# Patient Record
Sex: Male | Born: 1965 | Race: Black or African American | Hispanic: No | Smoking: Former smoker
Health system: Southern US, Community
[De-identification: ages and names within clinical notes are randomized; demographics above are authoritative.]

## PROBLEM LIST (undated history)

## (undated) DIAGNOSIS — G4733 Obstructive sleep apnea (adult) (pediatric): Secondary | ICD-10-CM

## (undated) DIAGNOSIS — G4730 Sleep apnea, unspecified: Secondary | ICD-10-CM

## (undated) DIAGNOSIS — E785 Hyperlipidemia, unspecified: Secondary | ICD-10-CM

## (undated) DIAGNOSIS — I10 Essential (primary) hypertension: Secondary | ICD-10-CM

## (undated) DIAGNOSIS — Z9989 Dependence on other enabling machines and devices: Secondary | ICD-10-CM

## (undated) DIAGNOSIS — I2510 Atherosclerotic heart disease of native coronary artery without angina pectoris: Secondary | ICD-10-CM

## (undated) DIAGNOSIS — R202 Paresthesia of skin: Secondary | ICD-10-CM

## (undated) DIAGNOSIS — I219 Acute myocardial infarction, unspecified: Secondary | ICD-10-CM

## (undated) DIAGNOSIS — E119 Type 2 diabetes mellitus without complications: Secondary | ICD-10-CM

## (undated) HISTORY — DX: Hyperlipidemia, unspecified: E78.5

## (undated) HISTORY — DX: Sleep apnea, unspecified: G47.30

## (undated) HISTORY — DX: Paresthesia of skin: R20.2

## (undated) HISTORY — DX: Atherosclerotic heart disease of native coronary artery without angina pectoris: I25.10

---

## 1989-05-11 HISTORY — PX: PATELLAR TENDON REPAIR: SHX737

## 2010-02-05 ENCOUNTER — Inpatient Hospital Stay (HOSPITAL_COMMUNITY): Admission: EM | Admit: 2010-02-05 | Discharge: 2010-02-08 | Payer: Self-pay | Admitting: Emergency Medicine

## 2010-02-07 ENCOUNTER — Encounter (INDEPENDENT_AMBULATORY_CARE_PROVIDER_SITE_OTHER): Payer: Self-pay | Admitting: Cardiovascular Disease

## 2010-11-27 LAB — CBC
HCT: 37.6 % — ABNORMAL LOW (ref 39.0–52.0)
HCT: 32.8 % — ABNORMAL LOW (ref 39.0–52.0)
HCT: 35.2 % — ABNORMAL LOW (ref 39.0–52.0)
HCT: 40.4 % (ref 39.0–52.0)
Hemoglobin: 11.3 g/dL — ABNORMAL LOW (ref 13.0–17.0)
MCHC: 33.8 g/dL (ref 30.0–36.0)
MCHC: 34.4 g/dL (ref 30.0–36.0)
MCV: 83.3 fL (ref 78.0–100.0)
Platelets: 222 10*3/uL (ref 150–400)
Platelets: 208 10*3/uL (ref 150–400)
Platelets: 211 10*3/uL (ref 150–400)
Platelets: 248 10*3/uL (ref 150–400)
RBC: 4.56 MIL/uL (ref 4.22–5.81)
RDW: 13.1 % (ref 11.5–15.5)
RDW: 13 % (ref 11.5–15.5)
RDW: 13.4 % (ref 11.5–15.5)
WBC: 8.6 10*3/uL (ref 4.0–10.5)
WBC: 9.1 10*3/uL (ref 4.0–10.5)

## 2010-11-27 LAB — BASIC METABOLIC PANEL
BUN: 9 mg/dL (ref 6–23)
BUN: 8 mg/dL (ref 6–23)
CO2: 28 mEq/L (ref 19–32)
CO2: 27 mEq/L (ref 19–32)
Calcium: 8.5 mg/dL (ref 8.4–10.5)
Chloride: 106 mEq/L (ref 96–112)
Creatinine, Ser: 1.17 mg/dL (ref 0.4–1.5)
Glucose, Bld: 133 mg/dL — ABNORMAL HIGH (ref 70–99)
Glucose, Bld: 139 mg/dL — ABNORMAL HIGH (ref 70–99)
Potassium: 3.3 mEq/L — ABNORMAL LOW (ref 3.5–5.1)

## 2010-11-27 LAB — HEMOGLOBIN A1C: Mean Plasma Glucose: 189 mg/dL — ABNORMAL HIGH (ref <117)

## 2010-11-27 LAB — CARDIAC PANEL(CRET KIN+CKTOT+MB+TROPI)
CK, MB: 120.5 ng/mL (ref 0.3–4.0)
CK, MB: 143.9 ng/mL (ref 0.3–4.0)
Relative Index: 12.1 — ABNORMAL HIGH (ref 0.0–2.5)
Relative Index: 13.8 — ABNORMAL HIGH (ref 0.0–2.5)
Total CK: 1188 U/L — ABNORMAL HIGH (ref 7–232)
Total CK: 872 U/L — ABNORMAL HIGH (ref 7–232)
Total CK: 931 U/L — ABNORMAL HIGH (ref 7–232)
Troponin I: 13.27 ng/mL (ref 0.00–0.06)

## 2010-11-27 LAB — DIFFERENTIAL
Basophils Absolute: 0 10*3/uL (ref 0.0–0.1)
Basophils Relative: 0 % (ref 0–1)
Eosinophils Absolute: 0 10*3/uL (ref 0.0–0.7)
Lymphs Abs: 1.1 10*3/uL (ref 0.7–4.0)
Monocytes Relative: 3 % (ref 3–12)
Neutro Abs: 7.5 10*3/uL (ref 1.7–7.7)

## 2010-11-27 LAB — GLUCOSE, CAPILLARY
Glucose-Capillary: 122 mg/dL — ABNORMAL HIGH (ref 70–99)
Glucose-Capillary: 111 mg/dL — ABNORMAL HIGH (ref 70–99)
Glucose-Capillary: 123 mg/dL — ABNORMAL HIGH (ref 70–99)
Glucose-Capillary: 147 mg/dL — ABNORMAL HIGH (ref 70–99)
Glucose-Capillary: 154 mg/dL — ABNORMAL HIGH (ref 70–99)
Glucose-Capillary: 155 mg/dL — ABNORMAL HIGH (ref 70–99)

## 2010-11-27 LAB — COMPREHENSIVE METABOLIC PANEL
ALT: 31 U/L (ref 0–53)
AST: 90 U/L — ABNORMAL HIGH (ref 0–37)
Alkaline Phosphatase: 75 U/L (ref 39–117)
BUN: 8 mg/dL (ref 6–23)
GFR calc non Af Amer: >60 mL/min (ref >60)
Sodium: 142 mEq/L (ref 135–145)
Total Bilirubin: 0.5 mg/dL (ref 0.3–1.2)
Total Protein: 6.6 g/dL (ref 6.0–8.3)

## 2010-11-27 LAB — POCT I-STAT, CHEM 8
BUN: 11 mg/dL (ref 6–23)
Calcium, Ion: 1.15 mmol/L (ref 1.12–1.32)
Creatinine, Ser: 0.8 mg/dL (ref 0.4–1.5)
HCT: 44 % (ref 39.0–52.0)
Sodium: 138 mEq/L (ref 135–145)

## 2010-11-27 LAB — POCT CARDIAC MARKERS: Myoglobin, poc: 210 ng/mL (ref 12–200)

## 2010-11-27 LAB — LIPID PANEL
HDL: 27 mg/dL — ABNORMAL LOW (ref >39)
LDL Cholesterol: 96 mg/dL (ref 0–99)
VLDL: 25 mg/dL (ref 0–40)

## 2010-11-27 LAB — PROTIME-INR
INR: 1.09 (ref 0.00–1.49)
Prothrombin Time: 14 seconds (ref 11.6–15.2)

## 2010-11-27 LAB — MAGNESIUM: Magnesium: 2.3 mg/dL (ref 1.5–2.5)

## 2011-11-20 ENCOUNTER — Emergency Department (HOSPITAL_COMMUNITY): Payer: Worker's Compensation

## 2011-11-20 ENCOUNTER — Other Ambulatory Visit: Payer: Self-pay

## 2011-11-20 ENCOUNTER — Encounter (HOSPITAL_COMMUNITY): Payer: Self-pay

## 2011-11-20 ENCOUNTER — Emergency Department (HOSPITAL_COMMUNITY)
Admission: EM | Admit: 2011-11-20 | Discharge: 2011-11-20 | Disposition: A | Discharge status: Home / Self Care | Payer: Worker's Compensation | Attending: Emergency Medicine | Admitting: Emergency Medicine

## 2011-11-20 DIAGNOSIS — R079 Chest pain, unspecified: Secondary | ICD-10-CM | POA: Insufficient documentation

## 2011-11-20 DIAGNOSIS — R0602 Shortness of breath: Secondary | ICD-10-CM | POA: Insufficient documentation

## 2011-11-20 DIAGNOSIS — Z7729 Contact with and (suspected ) exposure to other hazardous substances: Secondary | ICD-10-CM | POA: Insufficient documentation

## 2011-11-20 HISTORY — DX: Essential (primary) hypertension: I10

## 2011-11-20 LAB — DIFFERENTIAL
Basophils Absolute: 0 10*3/uL (ref 0.0–0.1)
Basophils Relative: 0 % (ref 0–1)
Eosinophils Relative: 3 % (ref 0–5)
Monocytes Absolute: 0.5 10*3/uL (ref 0.1–1.0)
Neutro Abs: 1.7 10*3/uL (ref 1.7–7.7)

## 2011-11-20 LAB — TROPONIN I: Troponin I: <0.3 ng/mL (ref <0.30)

## 2011-11-20 LAB — BASIC METABOLIC PANEL
Calcium: 9.3 mg/dL (ref 8.4–10.5)
Chloride: 102 mEq/L (ref 96–112)
Creatinine, Ser: 1.2 mg/dL (ref 0.50–1.35)
GFR calc Af Amer: 84 mL/min — ABNORMAL LOW (ref >90)

## 2011-11-20 LAB — CBC
HCT: 37.8 % — ABNORMAL LOW (ref 39.0–52.0)
MCHC: 34.9 g/dL (ref 30.0–36.0)
RDW: 13.1 % (ref 11.5–15.5)

## 2011-11-20 MED ORDER — ASPIRIN 81 MG PO CHEW
324.0000 mg | CHEWABLE_TABLET | Freq: Once | ORAL | Status: AC
  Administered 2011-11-20: 324 mg via ORAL
  Filled 2011-11-20: qty 4

## 2011-11-20 NOTE — ED Notes (Signed)
KGM:[id number]<VO> Expected date:11/20/11<BR> Expected time:10:21 AM<BR> Means of arrival:Ambulance<BR> Comments:<BR> Aerosal Can exposure

## 2011-11-20 NOTE — Discharge Instructions (Signed)
Chest Pain (Nonspecific) It is often hard to give a specific diagnosis for the cause of chest pain. There is always a chance that your pain could be related to something serious, such as a heart attack or a blood clot in the lungs. You need to follow up with your caregiver for further evaluation. CAUSES   Heartburn.   Pneumonia or bronchitis.   Anxiety or stress.   Inflammation around your heart (pericarditis) or lung (pleuritis or pleurisy).   A blood clot in the lung.   A collapsed lung (pneumothorax). It can develop suddenly on its own (spontaneous pneumothorax) or from injury (trauma) to the chest.   Shingles infection (herpes zoster virus).  The chest wall is composed of bones, muscles, and cartilage. Any of these can be the source of the pain.  The bones can be bruised by injury.   The muscles or cartilage can be strained by coughing or overwork.   The cartilage can be affected by inflammation and become sore (costochondritis).  DIAGNOSIS  Lab tests or other studies, such as X-rays, electrocardiography, stress testing, or cardiac imaging, may be needed to find the cause of your pain.  TREATMENT   Treatment depends on what may be causing your chest pain. Treatment may include:   Acid blockers for heartburn.   Anti-inflammatory medicine.   Pain medicine for inflammatory conditions.   Antibiotics if an infection is present.   You may be advised to change lifestyle habits. This includes stopping smoking and avoiding alcohol, caffeine, and chocolate.   You may be advised to keep your head raised (elevated) when sleeping. This reduces the chance of acid going backward from your stomach into your esophagus.   Most of the time, nonspecific chest pain will improve within 2 to 3 days with rest and mild pain medicine.  HOME CARE INSTRUCTIONS   If antibiotics were prescribed, take your antibiotics as directed. Finish them even if you start to feel better.   For the next few  days, avoid physical activities that bring on chest pain. Continue physical activities as directed.   Do not smoke.   Avoid drinking alcohol.   Only take over-the-counter or prescription medicine for pain, discomfort, or fever as directed by your caregiver.   Follow your caregiver's suggestions for further testing if your chest pain does not go away.   Keep any follow-up appointments you made. If you do not go to an appointment, you could develop lasting (chronic) problems with pain. If there is any problem keeping an appointment, you must call to reschedule.  SEEK MEDICAL CARE IF:   You think you are having problems from the medicine you are taking. Read your medicine instructions carefully.   Your chest pain does not go away, even after treatment.   You develop a rash with blisters on your chest.  SEEK IMMEDIATE MEDICAL CARE IF:   You have increased chest pain or pain that spreads to your arm, neck, jaw, back, or abdomen.   You develop shortness of breath, an increasing cough, or you are coughing up blood.   You have severe back or abdominal pain, feel nauseous, or vomit.   You develop severe weakness, fainting, or chills.   You have a fever.  THIS IS AN EMERGENCY. Do not wait to see if the pain will go away. Get medical help at once. Call your local emergency services (911 in [place] Do not drive yourself to the hospital. MAKE SURE YOU:   Understand these instructions.     Will watch your condition.   Will get help right away if you are not doing well or get worse.  Document Released: 06/06/2005 Document Revised: 08/16/2011 Document Reviewed: 04/01/2008 Exit[employer] Patient Information 2012 [employer]. 

## 2011-11-20 NOTE — ED Notes (Signed)
Continues to deny chest pain or SOB.

## 2011-11-20 NOTE — ED Provider Notes (Signed)
History     CSN: [id number]  Arrival date & time 11/20/11  1046   First MD Initiated Contact with Patient 11/20/11 1104      Chief Complaint  Patient presents with  . Shortness of Breath  . Chest Pain    (Consider location/radiation/quality/duration/timing/severity/associated sxs/prior treatment) HPI Comments: Patient presents after exposure to an unknown substance. He works as a Immunologist. While one of the trash bags was being compressed he inhaled a white substance which now the sulfa. He had associated chest tightness and shortness of breath which resolved upon breathing cleaner. At this time he is back to baseline. The HazMat team did not identify the substance. He has been decontaminated twice. At this time is at his baseline  The history is provided by the patient. No language interpreter was used.    Past Medical History  Diagnosis Date  . Hypertension   . MI (myocardial infarction)     Past Surgical History  Procedure Date  . Knee surgery     Family History  Problem Relation Age of Onset  . Hypertension Mother   . Cancer Mother   . Diabetes Mother   . Cancer Father     History  Substance Use Topics  . Smoking status: Former Games developer  . Smokeless tobacco: Never Used  . Alcohol Use: No      Review of Systems  Constitutional: Negative for fever, chills, activity change, appetite change and fatigue.  HENT: Negative for congestion, sore throat, rhinorrhea, neck pain and neck stiffness.   Respiratory: Positive for shortness of breath (only during exposure). Negative for cough.   Cardiovascular: Positive for chest pain (only during exposure). Negative for palpitations.  Gastrointestinal: Negative for nausea, vomiting and abdominal pain.  Genitourinary: Negative for dysuria, urgency, frequency and flank pain.  Musculoskeletal: Negative for myalgias, back pain and arthralgias.  Neurological: Negative for dizziness, weakness, light-headedness, numbness and  headaches.  All other systems reviewed and are negative.    Allergies  Bee and Food allergy formula  Home Medications   Current Outpatient Rx  Name Route Sig Dispense Refill  . AMLODIPINE BESYLATE 10 MG PO TABS Oral Take 10 mg by mouth daily.    . ASPIRIN EC 81 MG PO TBEC Oral Take 81 mg by mouth daily.    Marland Kitchen CLOPIDOGREL BISULFATE 75 MG PO TABS Oral Take 75 mg by mouth daily.    . ISOSORBIDE MONONITRATE ER 30 MG PO TB24 Oral Take 30 mg by mouth daily.    Marland Kitchen LISINOPRIL 40 MG PO TABS Oral Take 40 mg by mouth daily.    . NEBIVOLOL HCL 20 MG PO TABS Oral Take 20 mg by mouth daily.    Marland Kitchen NIACIN-SIMVASTATIN ER 500-20 MG PO TB24 Oral Take 1 tablet by mouth at bedtime.    Marland Kitchen NITROGLYCERIN 0.4 MG SL SUBL Sublingual Place 0.4 mg under the tongue every 5 (five) minutes as needed.    Marland Kitchen SPIRONOLACTONE 25 MG PO TABS Oral Take 25 mg by mouth 2 (two) times daily.      BP 136/79  Pulse 65  Temp(Src) 97.9 F (36.6 C) (Oral)  Resp 16  SpO2 98%  Physical Exam  Nursing note and vitals reviewed. Constitutional: He is oriented to person, place, and time. He appears well-developed and well-nourished. No distress.  HENT:  Head: Normocephalic and atraumatic.  Mouth/Throat: Oropharynx is clear and moist.  Eyes: Conjunctivae and EOM are normal. Pupils are equal, round, and reactive to light.  Neck: Normal  range of motion. Neck supple.  Cardiovascular: Normal rate, regular rhythm, normal heart sounds and intact distal pulses.  Exam reveals no gallop and no friction rub.   No murmur heard. Pulmonary/Chest: Effort normal and breath sounds normal. No respiratory distress. He exhibits no tenderness.  Abdominal: Soft. Bowel sounds are normal. There is no tenderness.  Musculoskeletal: Normal range of motion. He exhibits no tenderness.  Neurological: He is alert and oriented to person, place, and time. No cranial nerve deficit.  Skin: Skin is warm and dry. No rash noted.    ED Course  Procedures (including  critical care time)   Date: 11/20/2011  Rate: 59  Rhythm: sinus bradycardia  QRS Axis: normal  Intervals: normal  ST/T Wave abnormalities: deepening twave inversions   Conduction Disutrbances:none  Narrative Interpretation:   Old EKG Reviewed: changes noted  Labs Reviewed  CBC - Abnormal; Notable for the following:    HCT 37.8 (*)    All other components within normal limits  DIFFERENTIAL - Abnormal; Notable for the following:    Neutrophils Relative 36 (*)    Lymphocytes Relative 50 (*)    All other components within normal limits  BASIC METABOLIC PANEL - Abnormal; Notable for the following:    Glucose, Bld 129 (*)    GFR calc non Af Amer 73 (*)    GFR calc Af Amer 84 (*)    All other components within normal limits  TROPONIN I  TROPONIN I   Dg Chest 2 View  11/20/2011  *RADIOLOGY REPORT*  Clinical Data: Chest pain.  CHEST - 2 VIEW  Comparison: 02/05/2010  Findings: The heart, mediastinal, and hilar contours are normal. The lungs are well-expanded and clear.  Negative for pleural effusion. The bony thorax is unremarkable.  IMPRESSION: No acute cardiopulmonary disease.  Original Report Authenticated By: Britta Mccreedy, M.D.     1. Exposure to potentially hazardous substances   2. Chest pain       MDM  Department for approximately 4 hours after exposure to an unknown agent. Was asymptomatic on arrival to emergency department after presenting cleaner. Was deconed twice prior to arrival. Delta troponin was performed given the patient's history and the presence of chest pain with breathing substance. This was negative. Thus i feel this adequately rules out acute coronary syndrome he'll be discharged home with instructions to followup with primary care physician as needed. Return precautions were provided.        Dayton Bailiff, MD 11/20/11 [phone]
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
## 2011-11-26 ENCOUNTER — Ambulatory Visit (INDEPENDENT_AMBULATORY_CARE_PROVIDER_SITE_OTHER): Payer: 59 | Admitting: Physician Assistant

## 2011-11-26 VITALS — BP 115/73 | HR 61 | Temp 97.3°F | Resp 18 | Ht 66.0 in | Wt 205.0 lb

## 2011-11-26 DIAGNOSIS — R059 Cough, unspecified: Secondary | ICD-10-CM

## 2011-11-26 DIAGNOSIS — R197 Diarrhea, unspecified: Secondary | ICD-10-CM

## 2011-11-26 DIAGNOSIS — R110 Nausea: Secondary | ICD-10-CM

## 2011-11-26 DIAGNOSIS — R05 Cough: Secondary | ICD-10-CM

## 2011-11-26 DIAGNOSIS — I2510 Atherosclerotic heart disease of native coronary artery without angina pectoris: Secondary | ICD-10-CM | POA: Insufficient documentation

## 2011-11-26 DIAGNOSIS — J411 Mucopurulent chronic bronchitis: Secondary | ICD-10-CM

## 2011-11-26 MED ORDER — DICYCLOMINE HCL 10 MG PO CAPS: 10.0000 mg | ORAL_CAPSULE | Freq: Three times a day (TID) | ORAL | Status: DC

## 2011-11-26 MED ORDER — ONDANSETRON 8 MG PO TBDP: 8.0000 mg | ORAL_TABLET | Freq: Three times a day (TID) | ORAL | Status: AC | PRN

## 2011-11-26 NOTE — Progress Notes (Signed)
  Subjective:    Patient ID: John May, male    DOB: Jun 16, 1966, 46 y.o.   MRN: [id number]  HPI This patient presents with nausea, abdominal cramping and diarrhea x 3 days.  Nausea has improved a lot, but not resolved.  No eating since 6 pm 11/24/11.  Able to drink, "a little."  No dizziness.  No HA.  No blood or mucous in stool.  No recent ravel, new foods, new meds, recent antibiotics. One possible sick contact at work.  Review of Systems As above.    Objective:   Physical Exam Vital signs noted. Well-developed, well nourished BM who is awake, alert and oriented, in NAD. HEENT: [place]/AT, PERRL, EOMI.  Sclera and conjunctiva are clear.  EAC are patent, TMs are normal in appearance. Nasal mucosa is pink and moist. OP is clear. Neck: supple, non-tender, no lymphadenopathey, thyromegaly. Heart: RRR, no murmur Lungs: CTA Abdomen: normo-active bowel sounds, supple, no mass or organomegaly. Mild generalized tenderness. Extremities: no cyanosis, clubbing or edema. Skin: warm and dry without rash.     Assessment & Plan:  Viral gastroenteritis.  Supportive care.  Bentyl and Zofran for comfort.  Anticipatory guidance.

## 2011-11-26 NOTE — Patient Instructions (Signed)
Get lots of rest and drink extra fluids-at least 64 ounces of water daily.  As you are able to resume your regular diet, your stools will normalize.

## 2012-04-17 ENCOUNTER — Encounter: Payer: Self-pay | Admitting: *Deleted

## 2012-04-17 ENCOUNTER — Encounter: Payer: Self-pay | Admitting: Cardiovascular Disease

## 2012-04-17 DIAGNOSIS — I10 Essential (primary) hypertension: Secondary | ICD-10-CM | POA: Insufficient documentation

## 2012-04-17 DIAGNOSIS — I219 Acute myocardial infarction, unspecified: Secondary | ICD-10-CM | POA: Insufficient documentation

## 2012-04-18 ENCOUNTER — Ambulatory Visit (INDEPENDENT_AMBULATORY_CARE_PROVIDER_SITE_OTHER): Payer: 59 | Admitting: Cardiology

## 2012-04-18 ENCOUNTER — Encounter: Payer: Self-pay | Admitting: Cardiology

## 2012-04-18 VITALS — BP 151/81 | HR 60 | Ht 67.0 in | Wt 206.0 lb

## 2012-04-18 DIAGNOSIS — E785 Hyperlipidemia, unspecified: Secondary | ICD-10-CM

## 2012-04-18 DIAGNOSIS — I2510 Atherosclerotic heart disease of native coronary artery without angina pectoris: Secondary | ICD-10-CM

## 2012-04-18 DIAGNOSIS — I10 Essential (primary) hypertension: Secondary | ICD-10-CM

## 2012-04-18 DIAGNOSIS — I7090 Unspecified atherosclerosis: Secondary | ICD-10-CM

## 2012-04-18 DIAGNOSIS — R079 Chest pain, unspecified: Secondary | ICD-10-CM

## 2012-04-18 DIAGNOSIS — E782 Mixed hyperlipidemia: Secondary | ICD-10-CM | POA: Insufficient documentation

## 2012-04-18 DIAGNOSIS — R0600 Dyspnea, unspecified: Secondary | ICD-10-CM

## 2012-04-18 NOTE — Assessment & Plan Note (Signed)
Continue statin. 

## 2012-04-18 NOTE — Patient Instructions (Addendum)
Your physician recommends that you schedule a follow-up appointment in: 4 WEEKS WITH DR CRENSHAW  

## 2012-04-18 NOTE — Progress Notes (Signed)
HPI: 46 year old male with past medical history of coronary artery disease for evaluation of chest pain. Patient suffered a myocardial infarction in may of 2011. Cardiac catheterization in May of 2011 revealed A. Normal left main. There was a 50% proximal LAD and a 30% mid. There was a 40% ramus intermedius and a 40% first obtuse marginal. The right coronary artery had a 70% proximal lesion and then was totally occluded. Attempt at PCI was not successful and the patient was treated medically.ejection fraction was 45-50%. Patient had an echocardiogram in may of 2011 that showed an ejection fraction of 55-60%. There was mild hypokinesis of the basal and mid inferior wall. The left atrium was mildly dilated. The right ventricle was mildly dilated. Patient has been followed by Dr. Tresa Endo at [facility] heart and vascular since that time. Those records are not available. He has had stress tests by his report. He does describe dyspnea since his myocardial infarction. It occurs both with exertion and at rest. It increases with talking. He has orthopnea but no PND or pedal edema. He also has had chest pain since his myocardial infarction. It is substernal with radiation to his back. It lasts 30-45 minutes at a time. It is described as a pulling sensation. It increases with activities, inspiration, and certain movements. It occasionally increases with food intake. He presents for further evaluation.  Current Outpatient Prescriptions  Medication Sig Dispense Refill  . amLODipine (NORVASC) 10 MG tablet Take 10 mg by mouth daily.      Marland Kitchen aspirin EC 81 MG tablet Take 81 mg by mouth daily.      . clopidogrel (PLAVIX) 75 MG tablet Take 75 mg by mouth daily.      . isosorbide mononitrate (IMDUR) 30 MG 24 hr tablet Take 30 mg by mouth daily.      Marland Kitchen lisinopril (PRINIVIL,ZESTRIL) 40 MG tablet Take 40 mg by mouth daily.      . Nebivolol HCl (BYSTOLIC) 20 MG TABS Take 20 mg by mouth daily.      . Niacin (VITAMIN B-3 PO) Take  1,000 mg by mouth daily.      . niacin-simvastatin (SIMCOR) 500-20 MG 24 hr tablet Take 1 tablet by mouth at bedtime.      . nitroGLYCERIN (NITROSTAT) 0.4 MG SL tablet Place 0.4 mg under the tongue every 5 (five) minutes as needed.      Marland Kitchen spironolactone (ALDACTONE) 25 MG tablet Take 25 mg by mouth daily.         Allergies  Allergen Reactions  . Food Allergy Formula Swelling    crab  . Nutritional Supplements Swelling    Past Medical History  Diagnosis Date  . Hypertension   . ASCVD (arteriosclerotic cardiovascular disease) 11/26/2011    S/p MI 02/05/2010   . CAD (coronary artery disease)   . Hyperlipidemia     Past Surgical History  Procedure Date  . Knee surgery   . Knee arthroscopy     patellar tendon repair, left    History   Social History  . Marital Status: Married    Spouse Name: N/A    Number of Children: 2  . Years of Education: N/A   Occupational History  .  [employer]   Social History Main Topics  . Smoking status: Former Smoker    Quit date: 11/26/2003  . Smokeless tobacco: Never Used  . Alcohol Use: No  . Drug Use: No  . Sexually Active: Yes   Other Topics Concern  .  Not on file   Social History Narrative  . No narrative on file    Family History  Problem Relation Age of Onset  . Hypertension Mother   . Cancer Mother   . Diabetes Mother   . Cancer Father   . Stroke Mother     ROS: Complains of leg/arm pain but no fevers or chills, productive cough, hemoptysis, dysphasia, odynophagia, melena, hematochezia, dysuria, hematuria, rash, seizure activity, orthopnea, PND, pedal edema, claudication. Remaining systems are negative.  Physical Exam:   Blood pressure 151/81, pulse 60, height 5\' 7"  (1.702 m), weight 206 lb (93.441 kg).  General:  Well developed/well nourished in NAD Skin warm/dry Patient not depressed No peripheral clubbing Back-normal HEENT-normal/normal eyelids Neck supple/normal carotid upstroke bilaterally; no  bruits; no JVD; no thyromegaly chest - CTA/ normal expansion CV - RRR/normal S1 and S2; no murmurs, rubs or gallops;  PMI nondisplaced Abdomen -NT/ND, no HSM, no mass, + bowel sounds, no bruit 2+ femoral pulses, no bruits Ext-no edema, chords, 2+ DP Neuro-grossly nonfocal  ECG sinus bradycardia at a rate of 59. Left ventricular hypertrophy. Prior inferior infarct.

## 2012-04-18 NOTE — Assessment & Plan Note (Signed)
Symptoms atypical and chronic since MI; occur both at rest and with exertion; increased with cough, activities, eating and certain movements. Will obtain all records from Dr Tresa Endo. Patient may require R and L cath given persistent symptoms and dyspnea. I am not convinced all of his symptoms are cardiac related.

## 2012-04-18 NOTE — Assessment & Plan Note (Signed)
Blood pressure elevated but patient has not taken his medications this morning. He will resume all medications and we will follow.

## 2012-04-18 NOTE — Assessment & Plan Note (Signed)
Patient not volume overloaded on examination. He may require right heart cath for definitive evaluation.

## 2012-04-18 NOTE — Assessment & Plan Note (Signed)
Continue present medications. I will most likely DC his Plavix at next office visit pending previous records.

## 2012-05-13 ENCOUNTER — Encounter: Payer: Self-pay | Admitting: Cardiology

## 2012-05-13 ENCOUNTER — Other Ambulatory Visit: Payer: Self-pay | Admitting: Cardiology

## 2012-05-13 ENCOUNTER — Encounter: Payer: Self-pay | Admitting: *Deleted

## 2012-05-13 ENCOUNTER — Ambulatory Visit (INDEPENDENT_AMBULATORY_CARE_PROVIDER_SITE_OTHER): Payer: 59 | Admitting: Cardiology

## 2012-05-13 VITALS — BP 132/84 | HR 60 | Ht 67.0 in | Wt 207.0 lb

## 2012-05-13 DIAGNOSIS — R079 Chest pain, unspecified: Secondary | ICD-10-CM

## 2012-05-13 DIAGNOSIS — R072 Precordial pain: Secondary | ICD-10-CM

## 2012-05-13 DIAGNOSIS — I10 Essential (primary) hypertension: Secondary | ICD-10-CM

## 2012-05-13 DIAGNOSIS — E785 Hyperlipidemia, unspecified: Secondary | ICD-10-CM

## 2012-05-13 DIAGNOSIS — R0609 Other forms of dyspnea: Secondary | ICD-10-CM

## 2012-05-13 DIAGNOSIS — I2510 Atherosclerotic heart disease of native coronary artery without angina pectoris: Secondary | ICD-10-CM

## 2012-05-13 DIAGNOSIS — I7090 Unspecified atherosclerosis: Secondary | ICD-10-CM

## 2012-05-13 LAB — PROTIME-INR
INR: 1.1 ratio — ABNORMAL HIGH (ref 0.8–1.0)
Prothrombin Time: 11.6 s (ref 10.2–12.4)

## 2012-05-13 LAB — CBC WITH DIFFERENTIAL/PLATELET
Basophils Absolute: 0 10*3/uL (ref 0.0–0.1)
Eosinophils Absolute: 0.3 10*3/uL (ref 0.0–0.7)
Lymphocytes Relative: 51.7 % — ABNORMAL HIGH (ref 12.0–46.0)
MCHC: 32.6 g/dL (ref 30.0–36.0)
Neutrophils Relative %: 35.7 % — ABNORMAL LOW (ref 43.0–77.0)
Platelets: 229 10*3/uL (ref 150.0–400.0)
RBC: 4.75 Mil/uL (ref 4.22–5.81)
RDW: 13.1 % (ref 11.5–14.6)

## 2012-05-13 LAB — BASIC METABOLIC PANEL
Chloride: 105 mEq/L (ref 96–112)
GFR: 98.69 mL/min (ref >60.00)
Potassium: 4.3 mEq/L (ref 3.5–5.1)

## 2012-05-13 MED ORDER — NITROGLYCERIN 0.4 MG SL SUBL: 0.4000 mg | SUBLINGUAL_TABLET | SUBLINGUAL | Status: DC | PRN

## 2012-05-13 NOTE — Patient Instructions (Addendum)
Your physician recommends that you schedule a follow-up appointment in: 3 MONTHS WITH DR Jens Som  Your physician has requested that you have a cardiac catheterization. Cardiac catheterization is used to diagnose and/or treat various heart conditions. Doctors may recommend this procedure for a number of different reasons. The most common reason is to evaluate chest pain. Chest pain can be a symptom of coronary artery disease (CAD), and cardiac catheterization can show whether plaque is narrowing or blocking your heart's arteries. This procedure is also used to evaluate the valves, as well as measure the blood flow and oxygen levels in different parts of your heart. For further information please visit [email or web address] Please follow instruction sheet, as given.   Your physician recommends that you HAVE LAB WORK TODAY  A chest x-ray takes a picture of the organs and structures inside the chest, including the heart, lungs, and blood vessels. This test can show several things, including, whether the heart is enlarges; whether fluid is building up in the lungs; and whether pacemaker / defibrillator leads are still in place. AT [address]    STOP PLAVIX

## 2012-05-13 NOTE — Addendum Note (Signed)
Addended by: Freddi Starr on: 05/13/2012 09:11 AM   Modules accepted: Orders

## 2012-05-13 NOTE — Assessment & Plan Note (Signed)
Continue aspirin and statin. Discontinue Plavix. 

## 2012-05-13 NOTE — Progress Notes (Signed)
HPI: 46 year old male with past medical history of coronary artery disease for FU of chest pain. Patient suffered a myocardial infarction in May of 2011. Cardiac catheterization in May of 2011 revealed normal left main. There was a 50% proximal LAD and a 30% mid. There was a 40% ramus intermedius and a 40% first obtuse marginal. The right coronary artery had a 70% proximal lesion and then was totally occluded. Attempt at PCI was not successful and the patient was treated medically. Ejection fraction was 45-50%. Patient had an echocardiogram in May of 2011 that showed an ejection fraction of 55-60%. There was mild hypokinesis of the basal and mid inferior wall. The left atrium was mildly dilated. The right ventricle was mildly dilated. Patient had myoview at [facility] in April 2013 that showed EF 64 with improved infusion of inferior wall and no significant ischemia. I last saw him in August of 2013. Since then, he continues to describe dyspnea on exertion and chest pain with exertion. He also has chest pain and weakness at rest.   Current Outpatient Prescriptions  Medication Sig Dispense Refill  . amLODipine (NORVASC) 10 MG tablet Take 10 mg by mouth daily.      Marland Kitchen aspirin EC 81 MG tablet Take 81 mg by mouth daily.      . clopidogrel (PLAVIX) 75 MG tablet Take 75 mg by mouth daily.      . isosorbide mononitrate (IMDUR) 30 MG 24 hr tablet Take 30 mg by mouth daily.      Marland Kitchen lisinopril (PRINIVIL,ZESTRIL) 40 MG tablet Take 40 mg by mouth daily.      . Nebivolol HCl (BYSTOLIC) 20 MG TABS Take 20 mg by mouth daily.      . Niacin (VITAMIN B-3 PO) Take 1,000 mg by mouth daily.      . niacin-simvastatin (SIMCOR) 500-20 MG 24 hr tablet Take 1 tablet by mouth at bedtime.      . nitroGLYCERIN (NITROSTAT) 0.4 MG SL tablet Place 1 tablet (0.4 mg total) under the tongue every 5 (five) minutes as needed.  25 tablet  12  . spironolactone (ALDACTONE) 25 MG tablet Take 25 mg by mouth daily.       Marland Kitchen DISCONTD: nitroGLYCERIN  (NITROSTAT) 0.4 MG SL tablet Place 0.4 mg under the tongue every 5 (five) minutes as needed.         Past Medical History  Diagnosis Date  . Hypertension   . ASCVD (arteriosclerotic cardiovascular disease) 11/26/2011    S/p MI 02/05/2010   . CAD (coronary artery disease)   . Hyperlipidemia     Past Surgical History  Procedure Date  . Knee surgery   . Knee arthroscopy     patellar tendon repair, left    History   Social History  . Marital Status: Married    Spouse Name: N/A    Number of Children: 2  . Years of Education: N/A   Occupational History  .  [employer]   Social History Main Topics  . Smoking status: Former Smoker    Quit date: 11/26/2003  . Smokeless tobacco: Never Used  . Alcohol Use: No  . Drug Use: No  . Sexually Active: Yes   Other Topics Concern  . Not on file   Social History Narrative  . No narrative on file    ROS: no fevers or chills, productive cough, hemoptysis, dysphasia, odynophagia, melena, hematochezia, dysuria, hematuria, rash, seizure activity, orthopnea, PND, pedal edema, claudication. Remaining systems are negative.  Physical Exam: Well-developed well-nourished in no acute distress.  Skin is warm and dry.  HEENT is normal.  Neck is supple.  Chest is clear to auscultation with normal expansion.  Cardiovascular exam is regular rate and rhythm.  Abdominal exam nontender or distended. No masses palpated. Extremities show no edema. neuro grossly intact

## 2012-05-13 NOTE — Assessment & Plan Note (Addendum)
Patient continues to have exertional dyspnea and chest pain. Previous infarct associated with an occluded right coronary artery and PCI was unsuccessful. He has been treated medically. He did not have obstructive disease in the left system. Nuclear study in April showed normal LV function and question minimal ischemia in the inferior distribution. Given persistence of symptoms I will plan right and left cardiac catheterization to exclude progressive coronary disease and to evaluate right heart pressures. Continue aspirin, statin and beta blocker. Symptoms appear to be out of proportion to objective findings.

## 2012-05-13 NOTE — Assessment & Plan Note (Signed)
Continue statin. 

## 2012-05-13 NOTE — Assessment & Plan Note (Signed)
Blood pressure controlled. Continue present medications. Check potassium and renal function. 

## 2012-05-14 ENCOUNTER — Ambulatory Visit (HOSPITAL_COMMUNITY)
Admission: AD | Admit: 2012-05-14 | Discharge: 2012-05-15 | Disposition: A | Discharge status: Home / Self Care | Payer: 59 | Source: Ambulatory Visit | Attending: Cardiovascular Disease | Admitting: Cardiovascular Disease

## 2012-05-14 ENCOUNTER — Encounter (HOSPITAL_COMMUNITY)
Admission: AD | Disposition: A | Discharge status: Home / Self Care | Payer: Self-pay | Source: Ambulatory Visit | Attending: Cardiovascular Disease

## 2012-05-14 ENCOUNTER — Encounter (HOSPITAL_BASED_OUTPATIENT_CLINIC_OR_DEPARTMENT_OTHER): Payer: Self-pay | Admitting: *Deleted

## 2012-05-14 ENCOUNTER — Inpatient Hospital Stay (HOSPITAL_BASED_OUTPATIENT_CLINIC_OR_DEPARTMENT_OTHER)
Admission: RE | Admit: 2012-05-14 | Discharge: 2012-05-14 | Disposition: A | Discharge status: Hospice | Payer: 59 | Source: Ambulatory Visit | Attending: Cardiovascular Disease | Admitting: Cardiovascular Disease

## 2012-05-14 ENCOUNTER — Encounter (HOSPITAL_BASED_OUTPATIENT_CLINIC_OR_DEPARTMENT_OTHER)
Admission: RE | Disposition: A | Discharge status: Hospice | Payer: Self-pay | Source: Ambulatory Visit | Attending: Cardiovascular Disease

## 2012-05-14 ENCOUNTER — Encounter (HOSPITAL_COMMUNITY): Payer: Self-pay | Admitting: General Practice

## 2012-05-14 DIAGNOSIS — I10 Essential (primary) hypertension: Secondary | ICD-10-CM | POA: Insufficient documentation

## 2012-05-14 DIAGNOSIS — I200 Unstable angina: Secondary | ICD-10-CM | POA: Insufficient documentation

## 2012-05-14 DIAGNOSIS — R0609 Other forms of dyspnea: Secondary | ICD-10-CM | POA: Insufficient documentation

## 2012-05-14 DIAGNOSIS — E785 Hyperlipidemia, unspecified: Secondary | ICD-10-CM | POA: Insufficient documentation

## 2012-05-14 DIAGNOSIS — E782 Mixed hyperlipidemia: Secondary | ICD-10-CM | POA: Diagnosis present

## 2012-05-14 DIAGNOSIS — I2510 Atherosclerotic heart disease of native coronary artery without angina pectoris: Secondary | ICD-10-CM

## 2012-05-14 DIAGNOSIS — R0989 Other specified symptoms and signs involving the circulatory and respiratory systems: Secondary | ICD-10-CM | POA: Insufficient documentation

## 2012-05-14 DIAGNOSIS — R072 Precordial pain: Secondary | ICD-10-CM

## 2012-05-14 HISTORY — DX: Obstructive sleep apnea (adult) (pediatric): G47.33

## 2012-05-14 HISTORY — PX: CORONARY ANGIOPLASTY WITH STENT PLACEMENT: SHX49

## 2012-05-14 HISTORY — PX: PERCUTANEOUS CORONARY STENT INTERVENTION (PCI-S): SHX5485

## 2012-05-14 HISTORY — DX: Acute myocardial infarction, unspecified: I21.9

## 2012-05-14 HISTORY — DX: Dependence on other enabling machines and devices: Z99.89

## 2012-05-14 LAB — POCT I-STAT 3, VENOUS BLOOD GAS (G3P V)
Acid-base deficit: 2 mmol/L (ref 0.0–2.0)
pCO2, Ven: 44.6 mmHg — ABNORMAL LOW (ref 45.0–50.0)
pO2, Ven: 37 mmHg (ref 30.0–45.0)

## 2012-05-14 LAB — POCT I-STAT 3, ART BLOOD GAS (G3+): Acid-base deficit: 3 mmol/L — ABNORMAL HIGH (ref 0.0–2.0)

## 2012-05-14 LAB — POCT ACTIVATED CLOTTING TIME: Activated Clotting Time: 529 seconds

## 2012-05-14 SURGERY — PERCUTANEOUS CORONARY STENT INTERVENTION (PCI-S)
Anesthesia: LOCAL

## 2012-05-14 SURGERY — JV LEFT AND RIGHT HEART CATHETERIZATION WITH CORONARY ANGIOGRAM
Anesthesia: Moderate Sedation

## 2012-05-14 MED ORDER — SPIRONOLACTONE 25 MG PO TABS
25.0000 mg | ORAL_TABLET | Freq: Every day | ORAL | Status: DC
  Administered 2012-05-14 – 2012-05-15 (×2): 25 mg via ORAL
  Filled 2012-05-14 (×2): qty 1

## 2012-05-14 MED ORDER — DIAZEPAM 2 MG PO TABS
2.0000 mg | ORAL_TABLET | ORAL | Status: AC
Start: 2012-05-14 — End: 2012-05-14
  Administered 2012-05-14: 5 mg via ORAL

## 2012-05-14 MED ORDER — SODIUM CHLORIDE 0.9 % IV SOLN: 250.0000 mL | INTRAVENOUS | Status: DC | PRN

## 2012-05-14 MED ORDER — NIACIN-SIMVASTATIN ER 500-20 MG PO TB24: 1.0000 | ORAL_TABLET | Freq: Every day | ORAL | Status: DC

## 2012-05-14 MED ORDER — SODIUM CHLORIDE 0.9 % IV SOLN
INTRAVENOUS | Status: AC
  Administered 2012-05-14: 18:00:00 via INTRAVENOUS

## 2012-05-14 MED ORDER — HEPARIN (PORCINE) IN NACL 2-0.9 UNIT/ML-% IJ SOLN
INTRAMUSCULAR | Status: AC
  Filled 2012-05-14: qty 2000

## 2012-05-14 MED ORDER — NEBIVOLOL HCL 10 MG PO TABS
20.0000 mg | ORAL_TABLET | Freq: Every day | ORAL | Status: DC
  Administered 2012-05-14 – 2012-05-15 (×2): 20 mg via ORAL
  Filled 2012-05-14 (×2): qty 2

## 2012-05-14 MED ORDER — FENTANYL CITRATE 0.05 MG/ML IJ SOLN
INTRAMUSCULAR | Status: AC
  Filled 2012-05-14: qty 2

## 2012-05-14 MED ORDER — ONDANSETRON HCL 4 MG/2ML IJ SOLN: 4.0000 mg | Freq: Four times a day (QID) | INTRAMUSCULAR | Status: DC | PRN

## 2012-05-14 MED ORDER — NIACIN ER 500 MG PO CPCR
500.0000 mg | ORAL_CAPSULE | Freq: Every day | ORAL | Status: DC
  Administered 2012-05-14: 500 mg via ORAL
  Filled 2012-05-14 (×2): qty 1

## 2012-05-14 MED ORDER — CLOPIDOGREL BISULFATE 300 MG PO TABS: 600.0000 mg | ORAL_TABLET | Freq: Once | ORAL | Status: DC

## 2012-05-14 MED ORDER — LISINOPRIL 40 MG PO TABS
40.0000 mg | ORAL_TABLET | Freq: Every day | ORAL | Status: DC
  Administered 2012-05-14 – 2012-05-15 (×2): 40 mg via ORAL
  Filled 2012-05-14 (×2): qty 1

## 2012-05-14 MED ORDER — SODIUM CHLORIDE 0.9 % IV SOLN
1.0000 mL/kg/h | INTRAVENOUS | Status: DC
  Administered 2012-05-14: 0.799 mL/kg/h via INTRAVENOUS

## 2012-05-14 MED ORDER — NITROGLYCERIN 0.2 MG/ML ON CALL CATH LAB
INTRAVENOUS | Status: AC
  Filled 2012-05-14: qty 1

## 2012-05-14 MED ORDER — BIVALIRUDIN 250 MG IV SOLR
INTRAVENOUS | Status: AC
  Filled 2012-05-14: qty 250

## 2012-05-14 MED ORDER — ASPIRIN EC 81 MG PO TBEC
81.0000 mg | DELAYED_RELEASE_TABLET | Freq: Every day | ORAL | Status: DC
  Administered 2012-05-15: 10:00:00 81 mg via ORAL
  Filled 2012-05-14 (×2): qty 1

## 2012-05-14 MED ORDER — ACETAMINOPHEN 325 MG PO TABS: 650.0000 mg | ORAL_TABLET | ORAL | Status: DC | PRN

## 2012-05-14 MED ORDER — AMLODIPINE BESYLATE 10 MG PO TABS
10.0000 mg | ORAL_TABLET | Freq: Every day | ORAL | Status: DC
  Administered 2012-05-14 – 2012-05-15 (×2): 10 mg via ORAL
  Filled 2012-05-14 (×2): qty 1

## 2012-05-14 MED ORDER — SIMVASTATIN 20 MG PO TABS
20.0000 mg | ORAL_TABLET | Freq: Every day | ORAL | Status: DC
  Administered 2012-05-14: 20 mg via ORAL
  Filled 2012-05-14 (×2): qty 1

## 2012-05-14 MED ORDER — SODIUM CHLORIDE 0.9 % IJ SOLN: 3.0000 mL | INTRAMUSCULAR | Status: DC | PRN

## 2012-05-14 MED ORDER — CLOPIDOGREL BISULFATE 300 MG PO TABS: 300.0000 mg | ORAL_TABLET | Freq: Once | ORAL | Status: DC

## 2012-05-14 MED ORDER — SODIUM CHLORIDE 0.9 % IJ SOLN: 3.0000 mL | Freq: Two times a day (BID) | INTRAMUSCULAR | Status: DC

## 2012-05-14 MED ORDER — ISOSORBIDE MONONITRATE ER 30 MG PO TB24
30.0000 mg | ORAL_TABLET | Freq: Every day | ORAL | Status: DC
  Administered 2012-05-14 – 2012-05-15 (×2): 30 mg via ORAL
  Filled 2012-05-14 (×2): qty 1

## 2012-05-14 MED ORDER — MIDAZOLAM HCL 2 MG/2ML IJ SOLN
INTRAMUSCULAR | Status: AC
  Filled 2012-05-14: qty 2

## 2012-05-14 MED ORDER — ASPIRIN 81 MG PO CHEW
324.0000 mg | CHEWABLE_TABLET | ORAL | Status: AC
  Administered 2012-05-14: 324 mg via ORAL

## 2012-05-14 MED ORDER — CLOPIDOGREL BISULFATE 75 MG PO TABS
75.0000 mg | ORAL_TABLET | Freq: Every day | ORAL | Status: DC
  Administered 2012-05-15: 10:00:00 75 mg via ORAL
  Filled 2012-05-14: qty 1

## 2012-05-14 MED ORDER — LIDOCAINE HCL (PF) 1 % IJ SOLN
INTRAMUSCULAR | Status: AC
  Filled 2012-05-14: qty 30

## 2012-05-14 NOTE — CV Procedure (Signed)
    Cardiac Catheterization Operative Report  John May [id number] 9/4/201312:02 PM Lennette Bihari, MD  Procedure Performed:  1. Left Heart Catheterization 2. Selective Coronary Angiography 3. Right Heart Catheterization 4. Left ventricular angiogram  Operator: Verne Carrow, MD  Indication:   Chest pain, fatigue, dyspnea with known CAD. Pt with cath 2011 per Dr. Daphene Jaeger with 100% occlusion of RCA, unable to open with attempt at PCI. Recent stress myoview in [facility] with no reported inferior wall scar.                               Procedure Details: The risks, benefits, complications, treatment options, and expected outcomes were discussed with the patient. The patient and/or family concurred with the proposed plan, giving informed consent. The patient was brought to the cath lab after IV hydration was begun and oral premedication was given. The patient was further sedated with Versed and Fentanyl. The right groin  was prepped and draped in the usual manner. Using the modified Seldinger access technique, a 4 French sheath was placed in the femoral artery. A 6 French sheath was inserted into the right femoral vein. A multi-purpose catheter was used to perform a right heart catheterization. Standard diagnostic catheters were used to perform selective coronary angiography. A pigtail catheter was used to perform a left ventricular angiogram. There were no immediate complications. The patient was taken to the recovery area in stable condition.   Hemodynamic Findings: Ao:  120/69               LV: 124/8/11 RA:  6            RV: 33/9/11 PA:  31/11 (mean 19)       PCWP:  10 Fick Cardiac Output: 5.5 L/min Fick Cardiac Index: 2.7 L/min/m2 Central Aortic Saturation: 93% Pulmonary Artery Saturation: 67%   Angiographic Findings:  Left main: No obstructive disease noted.   Left Anterior Descending Artery: Large caliber vessel that courses to the apex. The mid vessel has serial 40%  lesions. The distal vessel is very small in caliber and has a 60% stenosis. The diagonal branch is small in caliber and has a proximal 30% stenosis.   Circumflex Artery: Intermediate branch is small in caliber and has proximal 40% stenosis. The AV groove Circumflex is small to moderate sized and gives off to obtuse marginal branches. There is mild plaque in both marginal branches.   Right Coronary Artery: Moderate sized dominant vessel with diffuse 30% plaque in the mid vessel. There is a discreet 99% stenosis in the mid vessel. The distal vessel has luminal irregularities.   Left Ventricular Angiogram: LVEF=55%.   Impression: 1. Single vessel CAD with severe stenosis mid RCA 2. Unstable angina 3. Preserved LV systolic function  Recommendations: Will plan PCI of RCA later today in the inpatient cath lab. Plavix 300 mg po x 1 now. He has been on Plavix daily.        Complications:  None; patient tolerated the procedure well.

## 2012-05-14 NOTE — Progress Notes (Signed)
Transported to main cath lab for PCI.  Right groin level 0, vital signs stable.

## 2012-05-14 NOTE — Progress Notes (Signed)
Site area: right groin  Site Prior to Removal:  Level 0  Pressure Applied For 20 MINUTES    Minutes Beginning at 1830  Manual:   yes  Patient Status During Pull:  AAO X 4  Post Pull Groin Site:  Level 0  Post Pull Instructions Given:  yes  Post Pull Pulses Present:  yes  Dressing Applied:  yes  Comments:  TOLERATED PROCEDURE WELL

## 2012-05-14 NOTE — CV Procedure (Signed)
   Cardiac Catheterization Operative Report  John May [id number] 9/4/20134:03 PM Lennette Bihari, MD  Procedure Performed:  1. PTCA/DES x 1 mid RCA    Operator: Verne Carrow, MD  Indication:  Unstable angina, known CAD. Diagnostic cath this am with severe stenosis mid RCA. The RCA was 100% occluded in 2011 during last cath.                             Procedure Details: The risks, benefits, complications, treatment options, and expected outcomes were discussed with the patient. The patient and/or family concurred with the proposed plan, giving informed consent before the diagnostic procedure. The patient was brought to the inpatient cath lab from the outpatient cath lab. The patient was further sedated with Versed and Fentanyl. The right groin had a 4 French sheath in place in the right femoral artery. There was a 6 [place] sheath present in the right femoral vein. I changed the arterial sheath to a 6 [place] system under sterile conditions. I changed the 6 French sheath in the vein to another 6 [place] sheath. He was load with 600 mg po Plavix x 1 in the outpatient cath lab this am. He was given a bolus of Angiomax and a drip was started. I then engaged the RCA with a 6 Fr JR4 guiding catheter. When the ACT was greater than 200, I passed a Cougar IC wire down the RCA. I then used a 2.0 x 15 mm balloon x 1 to pre-dilate the stenosis. I then deployed a 2.5 x 28 mm Promus Element DES in the mid RCA. I post-dilated the stent with a 2.5 x 21 mm Wadsworth balloon x 2. The stenosis was taken from 99% down to 0%.     There were no immediate complications. The patient was taken to the recovery area in stable condition.   Hemodynamic Findings: Central aortic pressure: 125/73  Impression: 1. Successful PTCA/DES x 1 mid RCA  Recommendations: He will need ASA and Plavix for one year. Continue statin/beta blocker.        Complications:  None; patient tolerated the procedure well.

## 2012-05-14 NOTE — Interval H&P Note (Signed)
History and Physical Interval Note:  05/14/2012 11:31 AM  John May  has presented today for cath with the diagnosis of cp  The various methods of treatment have been discussed with the patient and family. After consideration of risks, benefits and other options for treatment, the patient has consented to  Procedure(s) (LRB) with comments: JV LEFT AND RIGHT HEART CATHETERIZATION WITH CORONARY ANGIOGRAM (N/A) as a surgical intervention .  The patient's history has been reviewed, patient examined, no change in status, stable for surgery.  I have reviewed the patient's chart and labs.  Questions were answered to the patient's satisfaction.     Charidy Cappelletti

## 2012-05-14 NOTE — H&P (View-Only) (Signed)
 HPI: 45-year-old male with past medical history of coronary artery disease for FU of chest pain. Patient suffered a myocardial infarction in May of 2011. Cardiac catheterization in May of 2011 revealed normal left main. There was a 50% proximal LAD and a 30% mid. There was a 40% ramus intermedius and a 40% first obtuse marginal. The right coronary artery had a 70% proximal lesion and then was totally occluded. Attempt at PCI was not successful and the patient was treated medically. Ejection fraction was 45-50%. Patient had an echocardiogram in May of 2011 that showed an ejection fraction of 55-60%. There was mild hypokinesis of the basal and mid inferior wall. The left atrium was mildly dilated. The right ventricle was mildly dilated. Patient had myoview at [facility] in April 2013 that showed EF 64 with improved infusion of inferior wall and no significant ischemia. I last saw him in August of 2013. Since then, he continues to describe dyspnea on exertion and chest pain with exertion. He also has chest pain and weakness at rest.   Current Outpatient Prescriptions  Medication Sig Dispense Refill  . amLODipine (NORVASC) 10 MG tablet Take 10 mg by mouth daily.      . aspirin EC 81 MG tablet Take 81 mg by mouth daily.      . clopidogrel (PLAVIX) 75 MG tablet Take 75 mg by mouth daily.      . isosorbide mononitrate (IMDUR) 30 MG 24 hr tablet Take 30 mg by mouth daily.      . lisinopril (PRINIVIL,ZESTRIL) 40 MG tablet Take 40 mg by mouth daily.      . Nebivolol HCl (BYSTOLIC) 20 MG TABS Take 20 mg by mouth daily.      . Niacin (VITAMIN B-3 PO) Take 1,000 mg by mouth daily.      . niacin-simvastatin (SIMCOR) 500-20 MG 24 hr tablet Take 1 tablet by mouth at bedtime.      . nitroGLYCERIN (NITROSTAT) 0.4 MG SL tablet Place 1 tablet (0.4 mg total) under the tongue every 5 (five) minutes as needed.  25 tablet  12  . spironolactone (ALDACTONE) 25 MG tablet Take 25 mg by mouth daily.       . DISCONTD: nitroGLYCERIN  (NITROSTAT) 0.4 MG SL tablet Place 0.4 mg under the tongue every 5 (five) minutes as needed.         Past Medical History  Diagnosis Date  . Hypertension   . ASCVD (arteriosclerotic cardiovascular disease) 11/26/2011    S/p MI 02/05/2010   . CAD (coronary artery disease)   . Hyperlipidemia     Past Surgical History  Procedure Date  . Knee surgery   . Knee arthroscopy     patellar tendon repair, left    History   Social History  . Marital Status: Married    Spouse Name: N/A    Number of Children: 2  . Years of Education: N/A   Occupational History  .  [employer]   Social History Main Topics  . Smoking status: Former Smoker    Quit date: 11/26/2003  . Smokeless tobacco: Never Used  . Alcohol Use: No  . Drug Use: No  . Sexually Active: Yes   Other Topics Concern  . Not on file   Social History Narrative  . No narrative on file    ROS: no fevers or chills, productive cough, hemoptysis, dysphasia, odynophagia, melena, hematochezia, dysuria, hematuria, rash, seizure activity, orthopnea, PND, pedal edema, claudication. Remaining systems are negative.    Physical Exam: Well-developed well-nourished in no acute distress.  Skin is warm and dry.  HEENT is normal.  Neck is supple.  Chest is clear to auscultation with normal expansion.  Cardiovascular exam is regular rate and rhythm.  Abdominal exam nontender or distended. No masses palpated. Extremities show no edema. neuro grossly intact       

## 2012-05-15 ENCOUNTER — Encounter (HOSPITAL_COMMUNITY): Payer: Self-pay | Admitting: Nurse Practitioner

## 2012-05-15 DIAGNOSIS — I2510 Atherosclerotic heart disease of native coronary artery without angina pectoris: Secondary | ICD-10-CM

## 2012-05-15 DIAGNOSIS — I7090 Unspecified atherosclerosis: Secondary | ICD-10-CM

## 2012-05-15 DIAGNOSIS — I200 Unstable angina: Secondary | ICD-10-CM

## 2012-05-15 LAB — CBC
HCT: 35.9 % — ABNORMAL LOW (ref 39.0–52.0)
Hemoglobin: 12.4 g/dL — ABNORMAL LOW (ref 13.0–17.0)
MCH: 27.8 pg (ref 26.0–34.0)
MCHC: 34.5 g/dL (ref 30.0–36.0)
MCV: 80.5 fL (ref 78.0–100.0)
RBC: 4.46 MIL/uL (ref 4.22–5.81)

## 2012-05-15 LAB — BASIC METABOLIC PANEL
BUN: 15 mg/dL (ref 6–23)
CO2: 25 mEq/L (ref 19–32)
Calcium: 8.8 mg/dL (ref 8.4–10.5)
Creatinine, Ser: 1.1 mg/dL (ref 0.50–1.35)
GFR calc non Af Amer: 81 mL/min — ABNORMAL LOW (ref >90)
Glucose, Bld: 158 mg/dL — ABNORMAL HIGH (ref 70–99)

## 2012-05-15 MED ORDER — CLOPIDOGREL BISULFATE 75 MG PO TABS: 75.0000 mg | ORAL_TABLET | Freq: Every day | ORAL | Status: DC

## 2012-05-15 MED FILL — Dextrose Inj 5%: INTRAVENOUS | Qty: 50 | Status: AC

## 2012-05-15 NOTE — Progress Notes (Signed)
CARDIAC REHAB PHASE I   PRE:  Rate/Rhythm: 62SR  BP:  Supine: 121/78  Sitting:   Standing:    SaO2:   MODE:  Ambulation: 600 ft   POST:  Rate/Rhythem: 67SR  BP:  Supine:   Sitting: 122/71  Standing:    SaO2:  4098-1191 Pt walked 600 ft with steady gait. Tired by end of walk. Denied CP. To recliner for breakfast. [phone] Returned to do pt's education. Wife present. Education completed. Pt very concerned about responsibilities of work and the heavy lifting he has to do. Pt to discuss with cardiologist. Discussed CRP 2. Pt states copay too high. Discussed maintenance program if pt in agreement. Pt states this would not work out with work schedule. Pt to walk on his own.  Duanne Limerick

## 2012-05-15 NOTE — Discharge Summary (Signed)
Patient ID: John May,  MRN: [id number], DOB/AGE: 1966-07-01 46 y.o.  Admit date: 05/14/2012 Discharge date: 05/15/2012  Primary Cardiologist: B. Jens Som, MD  Discharge Diagnoses Principal Problem:  *Unstable angina Active Problems:  CAD (coronary artery disease)  **s/p MI 01/2010 - unsuccessful PCI of RCA  **s/p PCI/DES to mid RCA w/ Promus Element DES  Hypertension  Hyperlipidemia  Allergies Allergies  Allergen Reactions  . Bee Venom Swelling  . Food Allergy Formula Swelling    "Crab in stuffing; I eat shrimp and I don't have problems"  . Nutritional Supplements Swelling    unknown   Procedures  Cardiac Catheterization and PCI 05/14/2012  Hemodynamic Findings: Ao:  120/69               LV: 124/8/11 RA:  6            RV: 33/9/11 PA:  31/11 (mean 19)       PCWP:  10 Fick Cardiac Output: 5.5 L/min Fick Cardiac Index: 2.7 L/min/m2 Central Aortic Saturation: 93% Pulmonary Artery Saturation: 67%   Angiographic Findings:  Left main: No obstructive disease noted.  Left Anterior Descending Artery: Large caliber vessel that courses to the apex. The mid vessel has serial 40% lesions. The distal vessel is very small in caliber and has a 60% stenosis. The diagonal branch is small in caliber and has a proximal 30% stenosis.   Circumflex Artery: Intermediate branch is small in caliber and has proximal 40% stenosis. The AV groove Circumflex is small to moderate sized and gives off to obtuse marginal branches. There is mild plaque in both marginal branches.   Right Coronary Artery: Moderate sized dominant vessel with diffuse 30% plaque in the mid vessel. There is a discreet 99% stenosis in the mid vessel. The distal vessel has luminal irregularities.   **The RCA was successfully stented using a 2.5 x 28mm Promus Element Drug-eluting stent**  Left Ventricular Angiogram: LVEF=55%.  _____________  History of Present Illness  46 year old male with prior history of CAD status post  previous myocardial infarction in May of 2011 with unsuccessful percutaneous intervention attempted to the right coronary artery. He was recently seen in clinic with complaints of exertional chest pain and dyspnea. Decision was made to pursue diagnostic catheterization.  Hospital Course  Patient underwent diagnostic cardiac catheterization on September 4 revealing a subtotal occlusion of the mid right coronary artery. This had previously been described as occluded at the end of the procedure in May of 2011. It was felt that this was likely the culprit vessel. Patient then underwent successful PCI and stenting of the mid right coronary artery with placement of a 2.5 x 28 mm Promus element drug-eluting stent. He tolerated this procedure well and post procedure has been ambulating without recurrent symptoms or limitations. He will be discharged home today in good condition.  Discharge Vitals Blood pressure 111/62, pulse 61, temperature 97.4 F (36.3 C), temperature source Oral, resp. rate 18, height 5\' 7"  (1.702 m), weight 205 lb 11 oz (93.3 kg), SpO2 97.00%.  Filed Weights   05/14/12 1900 05/15/12 0007  Weight: 207 lb (93.895 kg) 205 lb 11 oz (93.3 kg)   Labs  CBC  Basename 05/15/12 0705 05/13/12 0923  WBC 6.2 5.3  NEUTROABS -- 1.9  HGB 12.4* 13.1  HCT 35.9* 40.1  MCV 80.5 84.4  PLT 210 229.0   Basic Metabolic Panel  Basename 05/15/12 0705 05/13/12 0923  NA 134* 139  K 4.2 4.3  CL 100 105  CO2 25 27  GLUCOSE 158* 146*  BUN 15 17  CREATININE 1.10 1.1  CALCIUM 8.8 9.2  MG -- --  PHOS -- --   Disposition  Pt is being discharged home today in good condition.  Follow-up Plans & Appointments  Follow-up Information    Follow up with Nicolasa Ducking, NP on 06/03/2012. (9:00 AM, Dr. Ludwig Clarks Nurse Practitioner)    Contact information:   [address] [place] [address] [phone]         Discharge Medications  Medication List  As of  05/15/2012 10:22 AM   TAKE these medications         amLODipine 10 MG tablet   Commonly known as: NORVASC   Take 10 mg by mouth daily.      aspirin EC 81 MG tablet   Take 81 mg by mouth daily.      BYSTOLIC 20 MG Tabs   Generic drug: Nebivolol HCl   Take 20 mg by mouth daily.      clopidogrel 75 MG tablet   Commonly known as: PLAVIX   Take 1 tablet (75 mg total) by mouth daily.      isosorbide mononitrate 30 MG 24 hr tablet   Commonly known as: IMDUR   Take 30 mg by mouth daily.      lisinopril 40 MG tablet   Commonly known as: PRINIVIL,ZESTRIL   Take 40 mg by mouth daily.      niacin-simvastatin 500-20 MG 24 hr tablet   Commonly known as: SIMCOR   Take 1 tablet by mouth at bedtime.      nitroGLYCERIN 0.4 MG SL tablet   Commonly known as: NITROSTAT   Place 0.4 mg under the tongue every 5 (five) minutes as needed.      spironolactone 25 MG tablet   Commonly known as: ALDACTONE   Take 25 mg by mouth daily.            Outstanding Labs/Studies  None  Duration of Discharge Encounter   Greater than 30 minutes including physician time.  Signed, Nicolasa Ducking NP 05/15/2012, 10:22 AM

## 2012-05-15 NOTE — Progress Notes (Signed)
    SUBJECTIVE: No events overnight. No complaints this am. No chest pain or SOB.   BP 127/59  Pulse 54  Temp 97.5 F (36.4 C) (Oral)  Resp 14  Ht 5\' 7"  (1.702 m)  Wt 205 lb 11 oz (93.3 kg)  BMI 32.22 kg/m2  SpO2 97%  Intake/Output Summary (Last 24 hours) at 05/15/12 0713 Last data filed at 05/15/12 0630  Gross per 24 hour  Intake 1020.5 ml  Output   1700 ml  Net -679.5 ml    PHYSICAL EXAM General: Well developed, well nourished, in no acute distress. Alert and oriented x 3.  Psych:  Good affect, responds appropriately Neck: No JVD. No masses noted.  Lungs: Clear bilaterally with no wheezes or rhonci noted.  Heart: RRR with no murmurs noted. Abdomen: Bowel sounds are present. Soft, non-tender.  Extremities: No lower extremity edema.   LABS: Basic Metabolic Panel:  Basename 05/13/12 0923  NA 139  K 4.3  CL 105  CO2 27  GLUCOSE 146*  BUN 17  CREATININE 1.1  CALCIUM 9.2  MG --  PHOS --   CBC:  Basename 05/13/12 0923  WBC 5.3  NEUTROABS 1.9  HGB 13.1  HCT 40.1  MCV 84.4  PLT 229.0   Current Meds:    . amLODipine  10 mg Oral Daily  . aspirin EC  81 mg Oral Daily  . bivalirudin      . clopidogrel  75 mg Oral Q breakfast  . fentaNYL      . heparin      . isosorbide mononitrate  30 mg Oral Daily  . lidocaine      . lisinopril  40 mg Oral Daily  . midazolam      . nebivolol  20 mg Oral Daily  . niacin  500 mg Oral QHS  . nitroGLYCERIN      . simvastatin  20 mg Oral QHS  . spironolactone  25 mg Oral Daily  . DISCONTD: niacin-simvastatin  1 tablet Oral QHS     ASSESSMENT AND PLAN:  1. Unstable angina/CAD: Pt admitted after PCI yesterday. Outpatient cath yesterday am. Found to have severe stenosis mid RCA. Now s/p DES x 1 mid RCA. No issues overnight.  Continue dual anti-platelet therapy with ASA/Plavix for at least one year. Continue beta blocker/Imdur/Ace-inh/statin. Check BMET and CBC before discharge.   2. Dispo: ambulate. D/c home this am.  Follow up with Dr. Jens Som in 2-3 weeks. F/U bmet and cbc this am before discharge. He is planning to stay out of work on [employer] until he is seen in f/u.   Lataja Newland  9/5/20137:13 AM

## 2012-05-16 NOTE — Discharge Summary (Signed)
See full note.cdm 

## 2012-06-03 ENCOUNTER — Ambulatory Visit (INDEPENDENT_AMBULATORY_CARE_PROVIDER_SITE_OTHER): Payer: 59 | Admitting: Nurse Practitioner

## 2012-06-03 ENCOUNTER — Encounter: Payer: Self-pay | Admitting: Nurse Practitioner

## 2012-06-03 VITALS — BP 126/74 | HR 65 | Ht 67.0 in | Wt 204.0 lb

## 2012-06-03 DIAGNOSIS — R202 Paresthesia of skin: Secondary | ICD-10-CM | POA: Insufficient documentation

## 2012-06-03 DIAGNOSIS — I10 Essential (primary) hypertension: Secondary | ICD-10-CM

## 2012-06-03 DIAGNOSIS — R579 Shock, unspecified: Secondary | ICD-10-CM

## 2012-06-03 DIAGNOSIS — R209 Unspecified disturbances of skin sensation: Secondary | ICD-10-CM

## 2012-06-03 MED ORDER — ISOSORBIDE MONONITRATE ER 60 MG PO TB24: 60.0000 mg | ORAL_TABLET | Freq: Every day | ORAL | Status: DC

## 2012-06-03 MED ORDER — ISOSORBIDE MONONITRATE ER 30 MG PO TB24: 60.0000 mg | ORAL_TABLET | Freq: Every day | ORAL | Status: DC

## 2012-06-03 NOTE — Progress Notes (Signed)
Patient Name: John May Date of Encounter: 06/03/2012  Primary Care Provider:  --- Primary Cardiologist:  B. Jens Som, MD  Patient Profile  46 year old male status post recent stenting of the right coronary artery who presents for followup.  Problem List   Past Medical History  Diagnosis Date  . Hypertension   . CAD (coronary artery disease)     a. 01/2010 s/p MI - cath showed occluded RCA - unsuccessful PCI;  b. 05/2012 Cath: LM nl, LAD 72m, 60d (small), Diag 30p(small), LCX small, OM's small, minor irregs, RI small 40p, RCA 34m, 39m->2.5x28mm Promus Element DES, EF 55%.   Marland Kitchen Hyperlipidemia   . OSA on CPAP   . Headache     "used to have them alot"  . Paresthesias     a. bilat upper & lower extremities - intermittently present since 2011.   Past Surgical History  Procedure Date  . Coronary angioplasty with stent placement 05/14/2012    "1"  . Patellar tendon repair 1990's    left    Allergies  Allergies  Allergen Reactions  . Bee Venom Swelling  . Food Allergy Formula Swelling    "Crab in stuffing; I eat shrimp and I don't have problems"  . Nutritional Supplements Swelling    unknown    HPI  46 year old male with above problem list.  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  He was recently seen by Dr. Jens Som with continued complaints of chest pain and dyspnea and he underwent diagnostic catheterization.  On the right coronary artery and significant stenosis as outlined above and this was successfully stented with a drug-eluting stent.  Patient tolerated this procedure well was discharged home the next day.  Patient reports that since his discharge he has continued to have fatigue and dyspnea on exertion.  He says that on one or 2 occasions where he really has pushed himself in walking, he is also had mild chest tightness which resolves within 5 minutes with rest.  Overall, his degree of dyspnea and chest  pain is less than what it was prior to stent placement.  Unfortunately, he also reports that he's been having paresthesias occurring intermittently, spontaneously, in both upper and lower extremities associated with sudden onset of weakness.  He says that as a result of these symptoms, he drops things quite often and also if he is walking when weakness occurs in his legs he will have to stop and sit down or also he feels he will fall.  He says these symptoms have been present at least since May of 2011 at the time of his initial heart attack.  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  He also notes occasional dizziness which seems mostly to occur after just having stood.  He denies PND, orthopnea, syncope, edema, or early satiety.  Home Medications  Prior to Admission medications   Medication Sig Start Date End Date Taking? Authorizing Provider  amLODipine (NORVASC) 10 MG tablet Take 10 mg by mouth daily.   Yes Historical Provider, MD  aspirin EC 81 MG tablet Take 81 mg by mouth daily.   Yes Historical Provider, MD  clopidogrel (PLAVIX) 75 MG tablet Take 1 tablet (75 mg total) by mouth daily. 05/15/12  Yes Ok Anis, NP  isosorbide mononitrate (IMDUR) 60 MG 24 hr tablet Take 1 tablet (60 mg total) by mouth daily. 06/03/12  Yes Ok Anis, NP  lisinopril (PRINIVIL,ZESTRIL) 40 MG tablet Take 40 mg by mouth daily.   Yes Historical Provider, MD  Nebivolol HCl (BYSTOLIC) 20 MG TABS Take 20 mg by mouth daily.   Yes Historical Provider, MD  niacin-simvastatin (SIMCOR) 500-20 MG 24 hr tablet Take 1 tablet by mouth at bedtime.   Yes Historical Provider, MD  nitroGLYCERIN (NITROSTAT) 0.4 MG SL tablet Place 0.4 mg under the tongue every 5 (five) minutes as needed. 05/13/12  Yes Lewayne Bunting, MD  spironolactone (ALDACTONE) 25 MG tablet  Take 25 mg by mouth daily.    Yes Historical Provider, MD    Review of Systems  As above, he has had fatigue, bilateral upper and lower extremity paresthesias, lower extremity weakness, dyspnea on exertion, occasional low less frequent chest pain, and occasional dizziness. All other systems reviewed and are otherwise negative except as noted above.  Physical Exam  Blood pressure 126/74, pulse 65, height 5\' 7"  (1.702 m), weight 204 lb (92.534 kg).  General: Pleasant, NAD Psych: Normal affect. Neuro: Alert and oriented X 3. Moves all extremities spontaneously. HEENT: Normal  Neck: Supple without bruits or JVD. Lungs:  Resp regular and unlabored, CTA. Heart: RRR no s3, s4, or murmurs. Abdomen: Soft, non-tender, non-distended, BS + x 4.  Extremities: No clubbing, cyanosis or edema. DP/PT/Radials 2+ and equal bilaterally.  Accessory Clinical Findings  ECG - sinus bradycardia, 54, inferior infarct, no acute ST-T changes.  Assessment & Plan  1.  [place]/CAD:  Patient is status post PCI and stenting of the right coronary artery earlier this month.  Reports his activity is limited by intermittent fatigue and also paresthesias in his upper and lower extremities.  As a result, he has not been fully exerting himself and if he really pushes himself does occasionally have dyspnea on exertion or even chest discomfort that is resolved with rest.  His ECG shows no acute changes today.  Despite symptoms, from a cardiac standpoint he has improved with less dyspnea on exertion and significantly less chest pain.  Will titrate his Imdur to 60 mg daily.  I don't believe that his upper or lower extremity paresthesias and associated weakness are of cardiac origin and have referred him to neurology for evaluation.  Patient spent much of his visit discussing his job and how he believes he will require disability.   I have recommended that before he consider going down that path that he followup with neurology for workup  of this his fatigue and paresthesias and then I'll have him follow with Dr. Jens Som at which time we can consider ETT to gauge his exercise tolerance prior to returning to work.  He remains on aspirin, Plavix, beta blocker, and statin therapy.  2.  Bilateral upper and lower extremity paresthesias and weakness: As noted above, patient reports that his activity has been limited by sudden onset of paresthesias and weakness in his legs to the point where he feels like he will fall.  He says the symptoms been ongoing for at least 2 years and has never been evaluated by neurology.  Neurology referral has been made.  3.  Hypertension: Stable.  4.  Hyperlipidemia:  Continue statin therapy.  It appears that he has not had lipid profile some time and we should plan to repeat this upon followup.  5.  Disposition: Follow  up with neurology and subsequently Dr. Jens Som.    Nicolasa Ducking, NP 06/03/2012, 11:12 AM

## 2012-06-03 NOTE — Patient Instructions (Addendum)
Your physician has recommended you make the following change in your medication: increase Imdur to 60 mg daily   Your provider recommends that you see a neurologist, we will arrange.  Your physician recommends that you schedule a follow-up appointment in: 3 weeks with Dr. Jens Som

## 2012-06-17 ENCOUNTER — Telehealth: Payer: Self-pay | Admitting: Cardiology

## 2012-06-17 NOTE — Telephone Encounter (Signed)
Spoke with renee, pt can have an MRI anytime with no problem

## 2012-06-17 NOTE — Telephone Encounter (Signed)
plz return call to Renee- Dr. Nat Christen. Neuro [phone], pt needs MRI scan needs to know if it is safe for pt to undergo procedure as he had a stent put in.

## 2012-06-24 ENCOUNTER — Encounter: Payer: Self-pay | Admitting: Cardiology

## 2012-06-24 ENCOUNTER — Ambulatory Visit (INDEPENDENT_AMBULATORY_CARE_PROVIDER_SITE_OTHER): Payer: 59 | Admitting: Cardiology

## 2012-06-24 ENCOUNTER — Encounter: Payer: Self-pay | Admitting: *Deleted

## 2012-06-24 VITALS — BP 154/94 | HR 56 | Ht 67.0 in | Wt 206.0 lb

## 2012-06-24 DIAGNOSIS — E785 Hyperlipidemia, unspecified: Secondary | ICD-10-CM

## 2012-06-24 DIAGNOSIS — R209 Unspecified disturbances of skin sensation: Secondary | ICD-10-CM

## 2012-06-24 DIAGNOSIS — I2510 Atherosclerotic heart disease of native coronary artery without angina pectoris: Secondary | ICD-10-CM

## 2012-06-24 DIAGNOSIS — R079 Chest pain, unspecified: Secondary | ICD-10-CM

## 2012-06-24 DIAGNOSIS — I10 Essential (primary) hypertension: Secondary | ICD-10-CM

## 2012-06-24 DIAGNOSIS — R202 Paresthesia of skin: Secondary | ICD-10-CM

## 2012-06-24 NOTE — Assessment & Plan Note (Signed)
Dyspnea and chest pain out of proportion to physical and objective findings. Patient may return to work.

## 2012-06-24 NOTE — Assessment & Plan Note (Signed)
Continue statin. 

## 2012-06-24 NOTE — Patient Instructions (Addendum)
Your physician wants you to follow-up in: 6 MONTHS WITH DR CRENSHAW You will receive a reminder letter in the mail two months in advance. If you don't receive a letter, please call our office to schedule the follow-up appointment.  

## 2012-06-24 NOTE — Assessment & Plan Note (Signed)
Blood pressure is elevated today but he has not yet taken his medications. We'll continue present medications and follow.

## 2012-06-24 NOTE — Assessment & Plan Note (Signed)
Workup in progress per neurology.

## 2012-06-24 NOTE — Assessment & Plan Note (Signed)
Continue aspirin, Plavix and statin. 

## 2012-06-24 NOTE — Progress Notes (Signed)
HPI: 46 year-old male with past medical history of coronary artery disease for FU. Patient suffered a myocardial infarction in May of 2011. Attempt at PCI of an occluded RCA was not successful and the patient was treated medically. Ejection fraction was 45-50%. Patient had an echocardiogram in May of 2011 that showed an ejection fraction of 55-60%. There was mild hypokinesis of the basal and mid inferior wall. The left atrium was mildly dilated. The right ventricle was mildly dilated. Patient had myoview at [facility] in April 2013 that showed EF 64 with no significant ischemia. Patient continued to have symptoms and had cath 9/13 that revealed Normal LM, 40 mid and 60 distal LAD, 30 diagonal, 40 lcx, 99 mid RCA; EF 55; patient had DES to RCA at that time. Patient seen in FU 9/13 and referred to neurology for paresthesia in ext. Wu in progress. Since then, he continues to have some dyspnea and chest pain but overall improved. He has some fatigue. He continues with tingling in his upper and lower extremities which is being evaluated.      Current Outpatient Prescriptions  Medication Sig Dispense Refill  . amLODipine (NORVASC) 10 MG tablet Take 10 mg by mouth daily.      Marland Kitchen aspirin EC 81 MG tablet Take 81 mg by mouth daily.      . clopidogrel (PLAVIX) 75 MG tablet Take 1 tablet (75 mg total) by mouth daily.  30 tablet  6  . isosorbide mononitrate (IMDUR) 60 MG 24 hr tablet Take 1 tablet (60 mg total) by mouth daily.  30 tablet  3  . lisinopril (PRINIVIL,ZESTRIL) 40 MG tablet Take 40 mg by mouth daily.      . Nebivolol HCl (BYSTOLIC) 20 MG TABS Take 20 mg by mouth daily.      . niacin-simvastatin (SIMCOR) 500-20 MG 24 hr tablet Take 1 tablet by mouth at bedtime.      . nitroGLYCERIN (NITROSTAT) 0.4 MG SL tablet Place 0.4 mg under the tongue every 5 (five) minutes as needed.      Marland Kitchen spironolactone (ALDACTONE) 25 MG tablet Take 25 mg by mouth daily.          Past Medical History  Diagnosis Date  .  Hypertension   . CAD (coronary artery disease)     a. 01/2010 s/p MI - cath showed occluded RCA - unsuccessful PCI;  b. 05/2012 Cath: LM nl, LAD 51m, 60d (small), Diag 30p(small), LCX small, OM's small, minor irregs, RI small 40p, RCA 33m, 82m->2.5x28mm Promus Element DES, EF 55%.   Marland Kitchen Hyperlipidemia   . OSA on CPAP   . Headache     "used to have them alot"  . Paresthesias     a. bilat upper & lower extremities - intermittently present since 2011.    Past Surgical History  Procedure Date  . Coronary angioplasty with stent placement 05/14/2012    "1"  . Patellar tendon repair 1990's    left    History   Social History  . Marital Status: Married    Spouse Name: N/A    Number of Children: 2  . Years of Education: N/A   Occupational History  .  [employer]   Social History Main Topics  . Smoking status: Former Smoker -- 0.5 packs/day for 18 years    Types: Cigarettes    Quit date: 11/26/2003  . Smokeless tobacco: Never Used  . Alcohol Use: No  . Drug Use: No  . Sexually Active:  Yes   Other Topics Concern  . Not on file   Social History Narrative  . No narrative on file    ROS: fatigue but no fevers or chills, productive cough, hemoptysis, dysphasia, odynophagia, melena, hematochezia, dysuria, hematuria, rash, seizure activity, orthopnea, PND, pedal edema, claudication. Remaining systems are negative.  Physical Exam: Well-developed well-nourished in no acute distress.  Skin is warm and dry.  HEENT is normal.  Neck is supple.  Chest is clear to auscultation with normal expansion.  Cardiovascular exam is regular rate and rhythm.  Abdominal exam nontender or distended. No masses palpated. Extremities show no edema. neuro grossly intact

## 2012-07-09 ENCOUNTER — Emergency Department (INDEPENDENT_AMBULATORY_CARE_PROVIDER_SITE_OTHER)
Admission: EM | Admit: 2012-07-09 | Discharge: 2012-07-09 | Disposition: A | Discharge status: Home / Self Care | Payer: 59 | Source: Home / Self Care | Attending: Family Medicine | Admitting: Family Medicine

## 2012-07-09 ENCOUNTER — Encounter (HOSPITAL_COMMUNITY): Payer: Self-pay

## 2012-07-09 DIAGNOSIS — H109 Unspecified conjunctivitis: Secondary | ICD-10-CM

## 2012-07-09 DIAGNOSIS — T169XXA Foreign body in ear, unspecified ear, initial encounter: Secondary | ICD-10-CM

## 2012-07-09 DIAGNOSIS — T161XXA Foreign body in right ear, initial encounter: Secondary | ICD-10-CM

## 2012-07-09 MED ORDER — HYDROCORTISONE-ACETIC ACID 1-2 % OT SOLN: 4.0000 [drp] | Freq: Three times a day (TID) | OTIC | Status: DC

## 2012-07-09 MED ORDER — POLYETHYL GLYCOL-PROPYL GLYCOL 0.4-0.3 % OP SOLN: 2.0000 [drp] | Freq: Three times a day (TID) | OPHTHALMIC | Status: DC | PRN

## 2012-07-09 MED ORDER — KETOTIFEN FUMARATE 0.025 % OP SOLN: 1.0000 [drp] | Freq: Two times a day (BID) | OPHTHALMIC | Status: DC

## 2012-07-09 NOTE — ED Notes (Signed)
States tip of applicator came off in ear while cleaning this AM; FB visible

## 2012-07-11 NOTE — ED Provider Notes (Signed)
History     CSN: [id number]  Arrival date & time 07/09/12  1001   First MD Initiated Contact with Patient 07/09/12 1025      Chief Complaint  Patient presents with  . Foreign Body in Ear    (Consider location/radiation/quality/duration/timing/severity/associated sxs/prior treatment) HPI Comments: 46 year old male and with multiple comorbidities here complaining of: #1) foreign body in the right ear canal. Patient states that he was cleaning his ears with acute the tip of the applicator came off and still in right ear canal for the last 2 days. Denies itchiness, pain or drainage. #2) bilateral eye itchiness and tearing. Also associated with intermittent sneezing. Denies burning discharge. Denies photophobia. Denies eye pain. Symptoms intermittent during the last 2 weeks. Denies history of seasonal allergies. Denies cough, chest pain, shortness of breath or wheezing.   Past Medical History  Diagnosis Date  . Hypertension   . CAD (coronary artery disease)     a. 01/2010 s/p MI - cath showed occluded RCA - unsuccessful PCI;  b. 05/2012 Cath: LM nl, LAD 63m, 60d (small), Diag 30p(small), LCX small, OM's small, minor irregs, RI small 40p, RCA 36m, 82m->2.5x28mm Promus Element DES, EF 55%.   Marland Kitchen Hyperlipidemia   . OSA on CPAP   . Headache     "used to have them alot"  . Paresthesias     a. bilat upper & lower extremities - intermittently present since 2011.    Past Surgical History  Procedure Date  . Coronary angioplasty with stent placement 05/14/2012    "1"  . Patellar tendon repair 1990's    left    Family History  Problem Relation Age of Onset  . Hypertension Mother   . Cancer Mother   . Diabetes Mother   . Cancer Father   . Stroke Mother     History  Substance Use Topics  . Smoking status: Former Smoker -- 0.5 packs/day for 18 years    Types: Cigarettes    Quit date: 11/26/2003  . Smokeless tobacco: Never Used  . Alcohol Use: No      Review of Systems    Constitutional: Negative for fever, chills and appetite change.  HENT: Positive for congestion and sneezing. Negative for ear pain, sore throat, trouble swallowing, neck pain and ear discharge.   Eyes: Positive for itching. Negative for photophobia, pain, redness and visual disturbance.  Respiratory: Negative for cough, shortness of breath and wheezing.   Gastrointestinal: Negative for nausea, vomiting, abdominal pain and diarrhea.  Musculoskeletal: Negative for myalgias and arthralgias.  Skin: Negative for rash.  Neurological: Negative for dizziness and headaches.  All other systems reviewed and are negative.    Allergies  Bee venom; Food allergy formula; and Nutritional supplements  Home Medications   Current Outpatient Rx  Name Route Sig Dispense Refill  . AMLODIPINE BESYLATE 10 MG PO TABS Oral Take 10 mg by mouth daily.    . ASPIRIN EC 81 MG PO TBEC Oral Take 81 mg by mouth daily.    Marland Kitchen CLOPIDOGREL BISULFATE 75 MG PO TABS Oral Take 1 tablet (75 mg total) by mouth daily. 30 tablet 6  . ISOSORBIDE MONONITRATE ER 60 MG PO TB24 Oral Take 1 tablet (60 mg total) by mouth daily. 30 tablet 3    Dose increase/please disregard prior RX of 30 mg ( ...  . LISINOPRIL 40 MG PO TABS Oral Take 40 mg by mouth daily.    . NEBIVOLOL HCL 20 MG PO TABS Oral Take 20 mg  by mouth daily.    Marland Kitchen NIACIN-SIMVASTATIN ER 500-20 MG PO TB24 Oral Take 1 tablet by mouth at bedtime.    Marland Kitchen NITROGLYCERIN 0.4 MG SL SUBL Sublingual Place 0.4 mg under the tongue every 5 (five) minutes as needed.    Marland Kitchen SPIRONOLACTONE 25 MG PO TABS Oral Take 25 mg by mouth daily.     Marland Kitchen HYDROCORTISONE-ACETIC ACID 1-2 % OT SOLN Right Ear Place 4 drops into the right ear 3 (three) times daily. Use as instructed for 3 days 10 mL 0  . KETOTIFEN FUMARATE 0.025 % OP SOLN Both Eyes Place 1 drop into both eyes 2 (two) times daily. 5 mL 0  . POLYETHYL GLYCOL-PROPYL GLYCOL 0.4-0.3 % OP SOLN Ophthalmic Apply 2 drops to eye 3 (three) times daily as  needed. 5 mL 0    BP 133/82  Pulse 59  Temp 97.9 F (36.6 C) (Oral)  Resp 17  SpO2 98%  Physical Exam  Nursing note and vitals reviewed. Constitutional: He is oriented to person, place, and time. He appears well-developed and well-nourished. No distress.  HENT:  Head: Normocephalic and atraumatic.  Left Ear: External ear normal.  Mouth/Throat: Oropharynx is clear and moist.       Mild nasal congestion with erythema and swelling of nasal turbinates. Normal obvious rhinorrhea. Right ear canal: There is a cottonball in the ear canal. Mild erythema and abrasions of the right ear canal obscured after foreign body removal. No exudates no significant swelling.  Neck: Neck supple.  Cardiovascular: Normal rate, regular rhythm and normal heart sounds.   Pulmonary/Chest: Effort normal and breath sounds normal. No respiratory distress. He has no wheezes. He has no rales. He exhibits no tenderness.  Lymphadenopathy:    He has no cervical adenopathy.  Neurological: He is alert and oriented to person, place, and time.  Skin: No rash noted.    ED Course  FOREIGN BODY REMOVAL Performed by: Sharin Grave Authorized by: Sharin Grave Consent: Verbal consent obtained. Risks and benefits: risks, benefits and alternatives were discussed Consent given by: patient Patient understanding: patient states understanding of the procedure being performed Patient consent: the patient's understanding of the procedure matches consent given Body area: ear Location details: right ear Removal mechanism: alligator forceps Complexity: simple 1 objects recovered. Post-procedure assessment: foreign body removed Patient tolerance: Patient tolerated the procedure well with no immediate complications.   (including critical care time)  Labs Reviewed - No data to display No results found.   1. Foreign body of ear, right   2. Conjunctivitis       MDM  Impress allergic conjunctivitis prescribed  ketotifen ophthalmic solution and added Systane eye lubrication drops. After foreign body removal recommended the use of acetic acid/hydrocortisone otic to prevent over infection. Supportive care as well as red flags that should prompt his return to medical attention discussed with patient and provided in writing       Sharin Grave, MD 07/11/12 [phone]

## 2012-08-27 ENCOUNTER — Encounter: Payer: Self-pay | Admitting: Cardiology

## 2012-08-27 ENCOUNTER — Ambulatory Visit (INDEPENDENT_AMBULATORY_CARE_PROVIDER_SITE_OTHER): Payer: 59 | Admitting: Cardiology

## 2012-08-27 VITALS — BP 110/68 | HR 58 | Ht 67.0 in | Wt 201.0 lb

## 2012-08-27 DIAGNOSIS — I2510 Atherosclerotic heart disease of native coronary artery without angina pectoris: Secondary | ICD-10-CM

## 2012-08-27 DIAGNOSIS — I10 Essential (primary) hypertension: Secondary | ICD-10-CM

## 2012-08-27 DIAGNOSIS — E785 Hyperlipidemia, unspecified: Secondary | ICD-10-CM

## 2012-08-27 DIAGNOSIS — R079 Chest pain, unspecified: Secondary | ICD-10-CM

## 2012-08-27 NOTE — Patient Instructions (Addendum)
Your physician wants you to follow-up in: 6 MONTHS WITH DR CRENSHAW You will receive a reminder letter in the mail two months in advance. If you don't receive a letter, please call our office to schedule the follow-up appointment.  

## 2012-08-27 NOTE — Assessment & Plan Note (Signed)
Continue aspirin, Plavix and statin. 

## 2012-08-27 NOTE — Assessment & Plan Note (Signed)
Continue statin. 

## 2012-08-27 NOTE — Assessment & Plan Note (Signed)
Blood pressure controlled. Continue present medications. 

## 2012-08-27 NOTE — Progress Notes (Signed)
HPI: 46 year-old male with past medical history of coronary artery disease for FU. Patient suffered a myocardial infarction in May of 2011. Attempt at PCI of an occluded RCA was not successful and the patient was treated medically. Ejection fraction was 45-50%. Patient had an echocardiogram in May of 2011 that showed an ejection fraction of 55-60%. There was mild hypokinesis of the basal and mid inferior wall. The left atrium was mildly dilated. The right ventricle was mildly dilated. Patient had myoview at [facility] in April 2013 that showed EF 64 with no significant ischemia. Patient continued to have symptoms and had cath 9/13 that revealed Normal LM, 40 mid and 60 distal LAD, 30 diagonal, 40 lcx, 99 mid RCA; EF 55; patient had DES to RCA at that time. Patient seen in FU 9/13 and referred to neurology for paresthesia in ext. Wu in progress. I last saw him in Oct 2013. Since then, he continues to have multiple complaints. He continues to have chest pain. He notices this with bending over and when he is in his truck and it bounces up and down. He complains of fatigue. He complains of bilateral upper extremity numbness. He complains of dyspnea.   Current Outpatient Prescriptions  Medication Sig Dispense Refill  . amLODipine (NORVASC) 10 MG tablet Take 10 mg by mouth daily.      Marland Kitchen aspirin EC 81 MG tablet Take 81 mg by mouth daily.      . clopidogrel (PLAVIX) 75 MG tablet Take 1 tablet (75 mg total) by mouth daily.  30 tablet  6  . isosorbide mononitrate (IMDUR) 60 MG 24 hr tablet Take 1 tablet (60 mg total) by mouth daily.  30 tablet  3  . lisinopril (PRINIVIL,ZESTRIL) 40 MG tablet Take 40 mg by mouth daily.      . Nebivolol HCl (BYSTOLIC) 20 MG TABS Take 20 mg by mouth daily.      . niacin-simvastatin (SIMCOR) 500-20 MG 24 hr tablet Take 1 tablet by mouth at bedtime.      . nitroGLYCERIN (NITROSTAT) 0.4 MG SL tablet Place 0.4 mg under the tongue every 5 (five) minutes as needed.      Marland Kitchen spironolactone  (ALDACTONE) 25 MG tablet Take 25 mg by mouth daily.          Past Medical History  Diagnosis Date  . Hypertension   . CAD (coronary artery disease)     a. 01/2010 s/p MI - cath showed occluded RCA - unsuccessful PCI;  b. 05/2012 Cath: LM nl, LAD 58m, 60d (small), Diag 30p(small), LCX small, OM's small, minor irregs, RI small 40p, RCA 42m, 74m->2.5x28mm Promus Element DES, EF 55%.   Marland Kitchen Hyperlipidemia   . OSA on CPAP   . Headache     "used to have them alot"  . Paresthesias     a. bilat upper & lower extremities - intermittently present since 2011.    Past Surgical History  Procedure Date  . Coronary angioplasty with stent placement 05/14/2012    "1"  . Patellar tendon repair 1990's    left    History   Social History  . Marital Status: Married    Spouse Name: N/A    Number of Children: 2  . Years of Education: N/A   Occupational History  .  [employer]   Social History Main Topics  . Smoking status: Former Smoker -- 0.5 packs/day for 18 years    Types: Cigarettes    Quit date: 11/26/2003  .  Smokeless tobacco: Never Used  . Alcohol Use: No  . Drug Use: No  . Sexually Active: Yes   Other Topics Concern  . Not on file   Social History Narrative  . No narrative on file    ROS: no fevers or chills, productive cough, hemoptysis, dysphasia, odynophagia, melena, hematochezia, dysuria, hematuria, rash, seizure activity, orthopnea, PND, pedal edema, claudication. Remaining systems are negative.  Physical Exam: Well-developed well-nourished in no acute distress.  Skin is warm and dry.  HEENT is normal.  Neck is supple.  Chest is clear to auscultation with normal expansion.  Cardiovascular exam is regular rate and rhythm.  Abdominal exam nontender or distended. No masses palpated. Extremities show no edema. neuro grossly intact  ECG sinus rhythm at a rate of 58. Prior inferior infarct. Left ventricular hypertrophy. Inferior lateral T-wave inversion.

## 2012-08-27 NOTE — Assessment & Plan Note (Addendum)
Dyspnea and chest pain out of proportion to physical and objective findings. They did not change with previous PCI. I do not think we need to pursue further cardiac evaluation at this point. Continue medical therapy. Some of his symptoms clearly are not cardiac. He notices increased pain when his truck bounces him up and down.

## 2012-11-23 ENCOUNTER — Encounter (HOSPITAL_COMMUNITY): Payer: Self-pay | Admitting: *Deleted

## 2012-11-23 ENCOUNTER — Inpatient Hospital Stay (HOSPITAL_COMMUNITY)
Admission: EM | Admit: 2012-11-23 | Discharge: 2012-11-26 | DRG: 287 | Disposition: A | Discharge status: Home / Self Care | Payer: 59 | Attending: Cardiology | Admitting: Cardiology

## 2012-11-23 ENCOUNTER — Emergency Department (HOSPITAL_COMMUNITY): Payer: 59

## 2012-11-23 DIAGNOSIS — Z7982 Long term (current) use of aspirin: Secondary | ICD-10-CM

## 2012-11-23 DIAGNOSIS — R9439 Abnormal result of other cardiovascular function study: Secondary | ICD-10-CM

## 2012-11-23 DIAGNOSIS — I10 Essential (primary) hypertension: Secondary | ICD-10-CM | POA: Diagnosis present

## 2012-11-23 DIAGNOSIS — Z9861 Coronary angioplasty status: Secondary | ICD-10-CM

## 2012-11-23 DIAGNOSIS — E669 Obesity, unspecified: Secondary | ICD-10-CM | POA: Diagnosis present

## 2012-11-23 DIAGNOSIS — I2510 Atherosclerotic heart disease of native coronary artery without angina pectoris: Secondary | ICD-10-CM | POA: Diagnosis present

## 2012-11-23 DIAGNOSIS — E785 Hyperlipidemia, unspecified: Secondary | ICD-10-CM | POA: Diagnosis present

## 2012-11-23 DIAGNOSIS — G4733 Obstructive sleep apnea (adult) (pediatric): Secondary | ICD-10-CM

## 2012-11-23 DIAGNOSIS — E782 Mixed hyperlipidemia: Secondary | ICD-10-CM | POA: Diagnosis present

## 2012-11-23 DIAGNOSIS — Z79899 Other long term (current) drug therapy: Secondary | ICD-10-CM

## 2012-11-23 DIAGNOSIS — D72820 Lymphocytosis (symptomatic): Secondary | ICD-10-CM | POA: Diagnosis present

## 2012-11-23 DIAGNOSIS — Z9989 Dependence on other enabling machines and devices: Secondary | ICD-10-CM

## 2012-11-23 DIAGNOSIS — E119 Type 2 diabetes mellitus without complications: Secondary | ICD-10-CM

## 2012-11-23 DIAGNOSIS — R079 Chest pain, unspecified: Secondary | ICD-10-CM

## 2012-11-23 DIAGNOSIS — R072 Precordial pain: Principal | ICD-10-CM

## 2012-11-23 HISTORY — DX: Type 2 diabetes mellitus without complications: E11.9

## 2012-11-23 LAB — CBC WITH DIFFERENTIAL/PLATELET
HCT: 37.7 % — ABNORMAL LOW (ref 39.0–52.0)
Hemoglobin: 13 g/dL (ref 13.0–17.0)
Lymphocytes Relative: 68 % — ABNORMAL HIGH (ref 12–46)
MCHC: 34.5 g/dL (ref 30.0–36.0)
MCV: 81.6 fL (ref 78.0–100.0)
Monocytes Absolute: 0.6 10*3/uL (ref 0.1–1.0)
Monocytes Relative: 7 % (ref 3–12)
Neutro Abs: 1.8 10*3/uL (ref 1.7–7.7)
WBC: 8.7 10*3/uL (ref 4.0–10.5)

## 2012-11-23 MED ORDER — ASPIRIN 81 MG PO CHEW
324.0000 mg | CHEWABLE_TABLET | Freq: Once | ORAL | Status: AC
  Administered 2012-11-23: 324 mg via ORAL
  Filled 2012-11-23: qty 4

## 2012-11-23 NOTE — ED Notes (Signed)
Spoke with phlebotomy regarding I-stat 8 and I stat Troponin.  Labs were sent to main lab.  Tyrone from lab is going to recollect.

## 2012-11-23 NOTE — ED Provider Notes (Signed)
History     CSN: [id number]  Arrival date & time 11/23/12  2119   First MD Initiated Contact with Patient 11/23/12 2135      Chief Complaint  Patient presents with  . Chest Pain  . Shortness of Breath    (Consider location/radiation/quality/duration/timing/severity/associated sxs/prior treatment) HPI History provided by pt and prior chart.  Per prior chart, pt had an MI in 01/2010, cath showed occluded RCA, PCI was unsuccessful, and DES placed in RCA in 05/2012.  Followed by [facility].  Pt reports that the CP and exertional fatigue he experienced prior to stent placement, never improved, and has recently, been gradually worsening.  Describes as non-radiating, non-pleuritic burning/squeezing/pressure sensation of left anterior chest that is aggravated by movement and occasionally associated w/ SOB and nausea.  Denies fever, cough, abdominal pain, diaphoresis and LE edema/ttp.  Becomes fatigued w/ minimal exertion.  Has not been taking his ntg but takes plavix and daily baby aspirin. No recent trauma but does some heavy lifting at work.  No RF for PE.     Past Medical History  Diagnosis Date  . Hypertension   . CAD (coronary artery disease)     a. 01/2010 s/p MI - cath showed occluded RCA - unsuccessful PCI;  b. 05/2012 Cath: LM nl, LAD 10m, 60d (small), Diag 30p(small), LCX small, OM's small, minor irregs, RI small 40p, RCA 15m, 36m->2.5x28mm Promus Element DES, EF 55%.   Marland Kitchen Hyperlipidemia   . OSA on CPAP   . Headache     "used to have them alot"  . Paresthesias     a. bilat upper & lower extremities - intermittently present since 2011.    Past Surgical History  Procedure Laterality Date  . Coronary angioplasty with stent placement  05/14/2012    "1"  . Patellar tendon repair  1990's    left    Family History  Problem Relation Age of Onset  . Hypertension Mother   . Cancer Mother   . Diabetes Mother   . Cancer Father   . Stroke Mother     History  Substance Use  Topics  . Smoking status: Former Smoker -- 0.50 packs/day for 18 years    Types: Cigarettes    Quit date: 11/26/2003  . Smokeless tobacco: Never Used  . Alcohol Use: No      Review of Systems  All other systems reviewed and are negative.    Allergies  Bee venom; Food allergy formula; Crab; and Nutritional supplements  Home Medications   Current Outpatient Rx  Name  Route  Sig  Dispense  Refill  . amLODipine (NORVASC) 10 MG tablet   Oral   Take 10 mg by mouth daily.         Marland Kitchen aspirin EC 81 MG tablet   Oral   Take 81 mg by mouth daily.         . clopidogrel (PLAVIX) 75 MG tablet   Oral   Take 1 tablet (75 mg total) by mouth daily.   30 tablet   6   . isosorbide mononitrate (IMDUR) 60 MG 24 hr tablet   Oral   Take 1 tablet (60 mg total) by mouth daily.   30 tablet   3     Dose increase/please disregard prior RX of 30 mg ( ...   . lisinopril (PRINIVIL,ZESTRIL) 40 MG tablet   Oral   Take 40 mg by mouth daily.         . Nebivolol  HCl (BYSTOLIC) 20 MG TABS   Oral   Take 20 mg by mouth daily.         . niacin-simvastatin (SIMCOR) 500-20 MG 24 hr tablet   Oral   Take 1 tablet by mouth at bedtime.         . nitroGLYCERIN (NITROSTAT) 0.4 MG SL tablet   Sublingual   Place 0.4 mg under the tongue every 5 (five) minutes as needed.         Marland Kitchen spironolactone (ALDACTONE) 25 MG tablet   Oral   Take 25 mg by mouth daily.            BP 130/68  Pulse 68  Temp(Src) 98.1 F (36.7 C) (Oral)  Resp 20  SpO2 99%  Physical Exam  Nursing note and vitals reviewed. Constitutional: He is oriented to person, place, and time. He appears well-developed and well-nourished. No distress.  HENT:  Head: Normocephalic and atraumatic.  Eyes:  Normal appearance  Neck: Normal range of motion.  Cardiovascular: Normal rate, regular rhythm and intact distal pulses.   Pulmonary/Chest: Effort normal and breath sounds normal. No respiratory distress.  No pleuritic pain  reported.  Tenderness left anterior chest, including the nipple.  Pain is not aggravated by ROM of LUE.   Abdominal: Soft. Bowel sounds are normal. He exhibits no distension. There is no tenderness. There is no guarding.  Musculoskeletal: Normal range of motion.  No peripheral edema or calf tenderness  Neurological: He is alert and oriented to person, place, and time.  Skin: Skin is warm and dry. No rash noted.  Psychiatric: He has a normal mood and affect. His behavior is normal.    ED Course  Procedures (including critical care time)   Date: 11/24/2012  Rate: 70  Rhythm: normal sinus rhythm  QRS Axis: normal  Intervals: normal  ST/T Wave abnormalities: normal  Conduction Disutrbances:none  Narrative Interpretation:   Old EKG Reviewed: unchanged   Labs Reviewed  CBC WITH DIFFERENTIAL - Abnormal; Notable for the following:    HCT 37.7 (*)    Neutrophils Relative 21 (*)    Lymphocytes Relative 68 (*)    Lymphs Abs 6.0 (*)    All other components within normal limits  COMPREHENSIVE METABOLIC PANEL - Abnormal; Notable for the following:    Glucose, Bld 117 (*)    Albumin 3.4 (*)    Total Bilirubin 0.2 (*)    GFR calc non Af Amer 90 (*)    All other components within normal limits  LIPID PANEL - Abnormal; Notable for the following:    HDL 28 (*)    LDL Cholesterol 101 (*)    All other components within normal limits  POCT I-STAT, CHEM 8 - Abnormal; Notable for the following:    Glucose, Bld 105 (*)    Hemoglobin 12.2 (*)    HCT 36.0 (*)    All other components within normal limits  TROPONIN I  TSH  HEMOGLOBIN A1C  TROPONIN I  TROPONIN I  HEPARIN LEVEL (UNFRACTIONATED)  CBC  POCT I-STAT TROPONIN I   Dg Chest 2 View  11/23/2012  *RADIOLOGY REPORT*  Clinical Data: Mid chest pain off and on for several months.  CHEST - 2 VIEW  Comparison: 11/20/2011  Findings: Shallow inspiration. The heart size and pulmonary vascularity are normal. The lungs appear clear and expanded  without focal air space disease or consolidation. No blunting of the costophrenic angles.  No pneumothorax.  Mediastinal contours appear intact.  No  significant changes since the previous study.  IMPRESSION: No evidence of active pulmonary disease.  Shallow inspiration.   Original Report Authenticated By: Burman Nieves, M.D.      1. Chest pain       MDM  [phone] M w/ h/o MI, most recent cardiac cath in 05/2012 w/ DES placement of RCA, presents w/ persistent exertional fatigue as well as L anterior CP and SOB.  On exam, afebrile, no respiratory distress, lungs clear, pain reproducible w/ palpation, abd benign.  CXR and labs pending.  Pt has received 4 baby aspirin.  He is currently CP free.    EKG, labs and CXR unremarkable.  All discussed w/ patient.  Consulted Dr. Donnie Aho for consult and he has decided to admit.         Otilio Miu, PA-C 11/24/12 [phone]

## 2012-11-23 NOTE — ED Notes (Addendum)
C/o CP, ongoing gradually progressively worse since stents placed in October. Has been putting off coming in to be seen. Also reports sob, dizziness and numbness in hands, sx come and go. Rates pain at this time 8/10, better since not moving around, worse with exertion. Takes plavix and ASA. LS CTA. Uses CPAP at night. Scant +1 pitting edema in LLE.

## 2012-11-24 ENCOUNTER — Observation Stay (HOSPITAL_COMMUNITY): Payer: 59

## 2012-11-24 DIAGNOSIS — R079 Chest pain, unspecified: Secondary | ICD-10-CM

## 2012-11-24 DIAGNOSIS — I2510 Atherosclerotic heart disease of native coronary artery without angina pectoris: Secondary | ICD-10-CM | POA: Diagnosis present

## 2012-11-24 LAB — CBC
HCT: 35.4 % — ABNORMAL LOW (ref 39.0–52.0)
MCHC: 34.5 g/dL (ref 30.0–36.0)
Platelets: 228 10*3/uL (ref 150–400)
RDW: 12.7 % (ref 11.5–15.5)

## 2012-11-24 LAB — LIPID PANEL
LDL Cholesterol: 101 mg/dL — ABNORMAL HIGH (ref 0–99)
Triglycerides: 148 mg/dL (ref <150)
VLDL: 30 mg/dL (ref 0–40)

## 2012-11-24 LAB — COMPREHENSIVE METABOLIC PANEL
ALT: 19 U/L (ref 0–53)
Alkaline Phosphatase: 50 U/L (ref 39–117)
BUN: 13 mg/dL (ref 6–23)
CO2: 25 mEq/L (ref 19–32)
Chloride: 103 mEq/L (ref 96–112)
GFR calc Af Amer: >90 mL/min (ref >90)
GFR calc non Af Amer: 90 mL/min — ABNORMAL LOW (ref >90)
Glucose, Bld: 117 mg/dL — ABNORMAL HIGH (ref 70–99)
Potassium: 4.3 mEq/L (ref 3.5–5.1)
Sodium: 137 mEq/L (ref 135–145)
Total Bilirubin: 0.2 mg/dL — ABNORMAL LOW (ref 0.3–1.2)
Total Protein: 6.6 g/dL (ref 6.0–8.3)

## 2012-11-24 LAB — POCT I-STAT, CHEM 8
BUN: 13 mg/dL (ref 6–23)
Creatinine, Ser: 1.1 mg/dL (ref 0.50–1.35)
Potassium: 3.7 mEq/L (ref 3.5–5.1)
Sodium: 142 mEq/L (ref 135–145)

## 2012-11-24 LAB — TROPONIN I
Troponin I: <0.3 ng/mL (ref <0.30)
Troponin I: <0.3 ng/mL (ref <0.30)

## 2012-11-24 LAB — POCT I-STAT TROPONIN I

## 2012-11-24 MED ORDER — SODIUM CHLORIDE 0.9 % IJ SOLN: 3.0000 mL | INTRAMUSCULAR | Status: DC | PRN

## 2012-11-24 MED ORDER — ASPIRIN EC 81 MG PO TBEC
81.0000 mg | DELAYED_RELEASE_TABLET | Freq: Every day | ORAL | Status: DC
  Administered 2012-11-24 – 2012-11-26 (×3): 81 mg via ORAL
  Filled 2012-11-24 (×3): qty 1

## 2012-11-24 MED ORDER — HEPARIN (PORCINE) IN NACL 100-0.45 UNIT/ML-% IJ SOLN
1200.0000 [IU]/h | INTRAMUSCULAR | Status: DC
  Administered 2012-11-24: 1200 [IU]/h via INTRAVENOUS
  Filled 2012-11-24: qty 250

## 2012-11-24 MED ORDER — NITROGLYCERIN 0.4 MG SL SUBL: 0.4000 mg | SUBLINGUAL_TABLET | SUBLINGUAL | Status: DC | PRN

## 2012-11-24 MED ORDER — ACETAMINOPHEN 325 MG PO TABS: 650.0000 mg | ORAL_TABLET | ORAL | Status: DC | PRN

## 2012-11-24 MED ORDER — NIACIN-SIMVASTATIN ER 500-20 MG PO TB24: 1.0000 | ORAL_TABLET | Freq: Every day | ORAL | Status: DC

## 2012-11-24 MED ORDER — SODIUM CHLORIDE 0.9 % IJ SOLN: 3.0000 mL | Freq: Two times a day (BID) | INTRAMUSCULAR | Status: DC

## 2012-11-24 MED ORDER — SIMVASTATIN 20 MG PO TABS
20.0000 mg | ORAL_TABLET | Freq: Every day | ORAL | Status: DC
  Administered 2012-11-24 – 2012-11-25 (×2): 20 mg via ORAL
  Filled 2012-11-24 (×3): qty 1

## 2012-11-24 MED ORDER — NIACIN ER 500 MG PO CPCR
500.0000 mg | ORAL_CAPSULE | Freq: Every day | ORAL | Status: DC
  Administered 2012-11-24 – 2012-11-25 (×2): 500 mg via ORAL
  Filled 2012-11-24 (×3): qty 1

## 2012-11-24 MED ORDER — TECHNETIUM TC 99M SESTAMIBI GENERIC - CARDIOLITE
10.0000 | Freq: Once | INTRAVENOUS | Status: AC | PRN
  Administered 2012-11-24: 10 via INTRAVENOUS

## 2012-11-24 MED ORDER — SODIUM CHLORIDE 0.9 % IV SOLN: 250.0000 mL | INTRAVENOUS | Status: DC | PRN

## 2012-11-24 MED ORDER — SODIUM CHLORIDE 0.9 % IV SOLN: INTRAVENOUS | Status: DC

## 2012-11-24 MED ORDER — ONDANSETRON HCL 4 MG/2ML IJ SOLN: 4.0000 mg | Freq: Four times a day (QID) | INTRAMUSCULAR | Status: DC | PRN

## 2012-11-24 MED ORDER — SPIRONOLACTONE 25 MG PO TABS
25.0000 mg | ORAL_TABLET | Freq: Every day | ORAL | Status: DC
  Administered 2012-11-24 – 2012-11-26 (×3): 25 mg via ORAL
  Filled 2012-11-24 (×3): qty 1

## 2012-11-24 MED ORDER — VITAMIN D3 25 MCG (1000 UNIT) PO TABS
1000.0000 [IU] | ORAL_TABLET | Freq: Every day | ORAL | Status: DC
  Administered 2012-11-24 – 2012-11-26 (×3): 1000 [IU] via ORAL
  Filled 2012-11-24 (×3): qty 1

## 2012-11-24 MED ORDER — TECHNETIUM TC 99M SESTAMIBI GENERIC - CARDIOLITE
30.0000 | Freq: Once | INTRAVENOUS | Status: AC | PRN
  Administered 2012-11-24: 30 via INTRAVENOUS

## 2012-11-24 MED ORDER — HEPARIN BOLUS VIA INFUSION
4000.0000 [IU] | Freq: Once | INTRAVENOUS | Status: AC
  Administered 2012-11-24: 4000 [IU] via INTRAVENOUS

## 2012-11-24 MED ORDER — REGADENOSON 0.4 MG/5ML IV SOLN
0.4000 mg | Freq: Once | INTRAVENOUS | Status: AC
  Administered 2012-11-24: 0.4 mg via INTRAVENOUS
  Filled 2012-11-24: qty 5

## 2012-11-24 MED ORDER — DIAZEPAM 5 MG PO TABS
5.0000 mg | ORAL_TABLET | ORAL | Status: AC
  Administered 2012-11-25: 5 mg via ORAL
  Filled 2012-11-24: qty 1

## 2012-11-24 MED ORDER — REGADENOSON 0.4 MG/5ML IV SOLN
INTRAVENOUS | Status: AC
  Filled 2012-11-24: qty 5

## 2012-11-24 MED ORDER — NEBIVOLOL HCL 10 MG PO TABS
20.0000 mg | ORAL_TABLET | Freq: Every day | ORAL | Status: DC
  Administered 2012-11-24 – 2012-11-26 (×2): 20 mg via ORAL
  Filled 2012-11-24 (×3): qty 2

## 2012-11-24 MED ORDER — LISINOPRIL 40 MG PO TABS
40.0000 mg | ORAL_TABLET | Freq: Every day | ORAL | Status: DC
  Administered 2012-11-24 – 2012-11-26 (×3): 40 mg via ORAL
  Filled 2012-11-24 (×3): qty 1

## 2012-11-24 MED ORDER — AMLODIPINE BESYLATE 10 MG PO TABS
10.0000 mg | ORAL_TABLET | Freq: Every day | ORAL | Status: DC
  Administered 2012-11-24 – 2012-11-26 (×3): 10 mg via ORAL
  Filled 2012-11-24 (×3): qty 1

## 2012-11-24 MED ORDER — CLOPIDOGREL BISULFATE 75 MG PO TABS
75.0000 mg | ORAL_TABLET | Freq: Every day | ORAL | Status: DC
  Administered 2012-11-24 – 2012-11-26 (×3): 75 mg via ORAL
  Filled 2012-11-24 (×2): qty 1

## 2012-11-24 MED ORDER — ISOSORBIDE MONONITRATE ER 60 MG PO TB24
60.0000 mg | ORAL_TABLET | Freq: Every day | ORAL | Status: DC
  Administered 2012-11-24 – 2012-11-25 (×2): 60 mg via ORAL
  Filled 2012-11-24 (×2): qty 1

## 2012-11-24 NOTE — Progress Notes (Signed)
Utilization review completed.  

## 2012-11-24 NOTE — ED Provider Notes (Signed)
Complaint of chest pain worse with exertion improved with rest.  onset daily since December 2013. He is presently asymptomatic.John May He has not taken any nitroglycerin. No treatment prior to coming here.  Doug Sou, MD 11/24/12 [phone]

## 2012-11-24 NOTE — Progress Notes (Signed)
 SUBJECTIVE: The patient is doing well today.  He has chronic chest heaviness and atypical features of diffuse arm numbness/ tingling.  This has been longstanding and did not improve with PCI 12/13.  I have reviewed Dr Crenshaw's office note which is consistent with this.  The patient reports that he has seen neurology without significant abnormalities found.    . amLODipine  10 mg Oral Daily  . aspirin EC  81 mg Oral Daily  . cholecalciferol  1,000 Units Oral Daily  . clopidogrel  75 mg Oral Daily  . isosorbide mononitrate  60 mg Oral Daily  . lisinopril  40 mg Oral Daily  . nebivolol  20 mg Oral Daily  . niacin  500 mg Oral QHS   And  . simvastatin  20 mg Oral QHS  . regadenoson  0.4 mg Intravenous Once  . sodium chloride  3 mL Intravenous Q12H  . spironolactone  25 mg Oral Daily      OBJECTIVE: Physical Exam: Filed Vitals:   11/24/12 0130 11/24/12 0145 11/24/12 0200 11/24/12 0309  BP: 136/82 133/84 140/88 132/79  Pulse: 63 67 61 56  Temp:    97.7 F (36.5 C)  TempSrc:      Resp: 14 18 14 16  Height:    5' 8" (1.727 m)  Weight:    196 lb 6.4 oz (89.086 kg)  SpO2: 98% 100% 99% 100%    Intake/Output Summary (Last 24 hours) at 11/24/12 0745 Last data filed at 11/24/12 0500  Gross per 24 hour  Intake     18 ml  Output      0 ml  Net     18 ml    Telemetry reveals sinus rhythm  GEN- The patient is well appearing, alert and oriented x 3 today.   Head- normocephalic, atraumatic Eyes-  Sclera clear, conjunctiva pink Ears- hearing intact Oropharynx- clear Neck- supple, no JVP Lymph- no cervical lymphadenopathy Lungs- Clear to ausculation bilaterally, normal work of breathing Heart- Regular rate and rhythm, no murmurs, rubs or gallops, PMI not laterally displaced GI- soft, NT, ND, + BS Extremities- no clubbing, cyanosis, or edema Skin- no rash or lesion Psych- euthymic mood, full affect Neuro- strength and sensation are intact  LABS: Basic Metabolic  Panel:  Recent Labs  11/24/12 11/24/12 0221  NA 142 137  K 3.7 4.3  CL 105 103  CO2  --  25  GLUCOSE 105* 117*  BUN 13 13  CREATININE 1.10 1.00  CALCIUM  --  8.7   Liver Function Tests:  Recent Labs  11/24/12 0221  AST 18  ALT 19  ALKPHOS 50  BILITOT 0.2*  PROT 6.6  ALBUMIN 3.4*   No results found for this basename: LIPASE, AMYLASE,  in the last 72 hours CBC:  Recent Labs  11/23/12 2128 11/24/12  WBC 8.7  --   NEUTROABS 1.8  --   HGB 13.0 12.2*  HCT 37.7* 36.0*  MCV 81.6  --   PLT 255  --    Cardiac Enzymes:  Recent Labs  11/24/12 0221  TROPONINI <0.30   BNP: No components found with this basename: POCBNP,  D-Dimer: No results found for this basename: DDIMER,  in the last 72 hours Hemoglobin A1C: No results found for this basename: HGBA1C,  in the last 72 hours Fasting Lipid Panel:  Recent Labs  11/24/12 0222  CHOL 159  HDL 28*  LDLCALC 101*  TRIG 148  CHOLHDL 5.7   Thyroid Function   Tests: No results found for this basename: TSH, T4TOTAL, FREET3, T3FREE, THYROIDAB,  in the last 72 hours Anemia Panel: No results found for this basename: VITAMINB12, FOLATE, FERRITIN, TIBC, IRON, RETICCTPCT,  in the last 72 hours  RADIOLOGY: Dg Chest 2 View  11/23/2012  *RADIOLOGY REPORT*  Clinical Data: Mid chest pain off and on for several months.  CHEST - 2 VIEW  Comparison: 11/20/2011  Findings: Shallow inspiration. The heart size and pulmonary vascularity are normal. The lungs appear clear and expanded without focal air space disease or consolidation. No blunting of the costophrenic angles.  No pneumothorax.  Mediastinal contours appear intact.  No significant changes since the previous study.  IMPRESSION: No evidence of active pulmonary disease.  Shallow inspiration.   Original Report Authenticated By: William Stevens, M.D.     ASSESSMENT AND PLAN:  Active Problems:   Unstable angina   CAD (coronary artery disease)  1. Atypical chest pain Unlikely  cardiac I think that stress testing at this time is appropriate however.  IF low risk, medical management and discharge are planned.  2. CAD As above Could consider adding ranexa for a short trial to see if his pain improves with this. Otherwise, his medical regimen is reasonably optimized  If low risk myoview, would discharge later today to follow-up with PCP and Dr Crenshaw as an outpatient   Kareena Arrambide, MD 11/24/2012 7:45 AM  

## 2012-11-24 NOTE — Progress Notes (Signed)
ANTICOAGULATION CONSULT NOTE - Initial Consult  Pharmacy Consult for heparin Indication: chest pain/ACS  Allergies  Allergen Reactions  . Bee Venom Swelling  . Food Allergy Formula Swelling    "Crab in stuffing; I eat shrimp and I don't have problems"  . Crab (Shellfish Allergy)   . Nutritional Supplements Swelling    unknown    Patient Measurements: Heparin Dosing Weight: 85kg  Vital Signs: Temp: 98.1 F (36.7 C) (03/16 2128) Temp src: Oral (03/16 2128) BP: 140/88 mmHg (03/17 0200) Pulse Rate: 61 (03/17 0200)  Labs:  Recent Labs  11/23/12 2128 11/24/12  HGB 13.0 12.2*  HCT 37.7* 36.0*  PLT 255  --   CREATININE  --  1.10    Medical History: Past Medical History  Diagnosis Date  . Hypertension   . CAD (coronary artery disease)     a. 01/2010 s/p MI - cath showed occluded RCA - unsuccessful PCI;  b. 05/2012 Cath: LM nl, LAD 100m, 60d (small), Diag 30p(small), LCX small, OM's small, minor irregs, RI small 40p, RCA 66m, 32m->2.5x28mm Promus Element DES, EF 55%.   Marland Kitchen Hyperlipidemia   . OSA on CPAP   . Headache     "used to have them alot"  . Paresthesias     a. bilat upper & lower extremities - intermittently present since 2011.    Assessment: 47yo male had DES placed 38mo ago, c/o chest discomfort since then which has increased with exertion over the past week followed by one episode of more severe CP yesterday, initial troponin negative, to be admitted for serial enzymes given hx and progressive nature of CP, to begin heparin.  Goal of Therapy:  Heparin level 0.3-0.7 units/ml Monitor platelets by anticoagulation protocol: Yes   Plan:  Will give heparin 4000 units IV bolus x1 followed by gtt at 1200 units/hr and monitor heparin levels and CBC.  Vernard Gambles, PharmD, BCPS  11/24/2012,2:18 AM

## 2012-11-24 NOTE — ED Provider Notes (Signed)
Medical screening examination/treatment/procedure(s) were conducted as a shared visit with non-physician practitioner(s) and myself.  I personally evaluated the patient during the encounter  Nanako Stopher, MD 11/24/12 1804 

## 2012-11-24 NOTE — H&P (Signed)
History and Physical   Admit date: 11/23/2012 Name:  John May Medical record number: [id number] DOB/Age:  1966-01-22  47 y.o. male  Referring Physician:   [facility]  Primary Cardiologist: Dr. Olga Millers  Chief complaint/reason for admission:  Chest pain  HPI:  This 47 year-old black male has a history of hypertension, hyperlipidemia and obstructive sleep apnea. He has coronary artery disease and in September had a drug-eluting stent placed to a subtotal stenosis in the right coronary artery. Since then he has continued to have some chest discomfort and states it is worse when he is riding in his truck and is bouncing. He however is noted increased exertional chest discomfort over the past week and is been considering come to the emergency room. Today he while doing laundry had the onset of midsternal chest discomfort that lasted between 20 and 30 minutes and was assisted with shortness of breath and he came to the emergency room. EKG shows a previous inferior infarction initial troponin was negative. Because of the progressive history of the chest discomfort he is admitted to check serial enzymes. He denies PND, orthopnea or edema.    Past Medical History  Diagnosis Date  . Hypertension   . CAD (coronary artery disease)     a. 01/2010 s/p MI - cath showed occluded RCA - unsuccessful PCI;  b. 05/2012 Cath: LM nl, LAD 2m, 60d (small), Diag 30p(small), LCX small, OM's small, minor irregs, RI small 40p, RCA 65m, 36m->2.5x28mm Promus Element DES, EF 55%.   Marland Kitchen Hyperlipidemia   . OSA on CPAP   . Headache     "used to have them alot"  . Paresthesias     a. bilat upper & lower extremities - intermittently present since 2011.      Past Surgical History  Procedure Laterality Date  . Coronary angioplasty with stent placement  05/14/2012    "1"  . Patellar tendon repair  1990's    left  .  Allergies: is allergic to bee venom; food allergy formula; crab; and nutritional  supplements.   Medications: Prior to Admission medications   Medication Sig Start Date End Date Taking? Authorizing Provider  amLODipine (NORVASC) 10 MG tablet Take 10 mg by mouth daily.   Yes Historical Provider, MD  aspirin EC 81 MG tablet Take 81 mg by mouth daily.   Yes Historical Provider, MD  cholecalciferol (VITAMIN D) 1000 UNITS tablet Take 1,000 Units by mouth daily.   Yes Historical Provider, MD  clopidogrel (PLAVIX) 75 MG tablet Take 1 tablet (75 mg total) by mouth daily. 05/15/12  Yes Ok Anis, NP  isosorbide mononitrate (IMDUR) 60 MG 24 hr tablet Take 1 tablet (60 mg total) by mouth daily. 06/03/12  Yes Ok Anis, NP  lisinopril (PRINIVIL,ZESTRIL) 40 MG tablet Take 40 mg by mouth daily.   Yes Historical Provider, MD  Nebivolol HCl (BYSTOLIC) 20 MG TABS Take 20 mg by mouth daily.   Yes Historical Provider, MD  niacin-simvastatin (SIMCOR) 500-20 MG 24 hr tablet Take 1 tablet by mouth at bedtime.   Yes Historical Provider, MD  spironolactone (ALDACTONE) 25 MG tablet Take 25 mg by mouth daily.    Yes Historical Provider, MD  nitroGLYCERIN (NITROSTAT) 0.4 MG SL tablet Place 0.4 mg under the tongue every 5 (five) minutes as needed. 05/13/12   Lewayne Bunting, MD   Family History:  Family Status  Relation Status Death Age  . Mother Alive   . Father Deceased  Social History:   reports that he quit smoking about 9 years ago. His smoking use included Cigarettes. He has a 9 pack-year smoking history. He has never used smokeless tobacco. He reports that he does not drink alcohol or use illicit drugs.   He lives at home with his wife. 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 He finds it difficult to drive the truck because he states that the bouncing causes chest pain.   Review of Systems:   Other than as noted above, the remainder of the review of systems is normal  Physical Exam: BP 127/82   Pulse 62  Temp(Src) 98.1 F (36.7 C) (Oral)  Resp 16  SpO2 99% General appearance: alert, cooperative, appears stated age and no distress Head: Normocephalic, without obvious abnormality, atraumatic Neck: no adenopathy, no carotid bruit, no JVD,  Lungs: clear to auscultation bilaterally Heart: regular rate and rhythm, S1, S2 normal, no murmur, click, rub or gallop Abdomen: soft, non-tender; bowel sounds normal; no masses,  no organomegaly Rectal: deferred Extremities: extremities normal, atraumatic, no cyanosis or edema Pulses: 2+ and symmetric Lymph nodes: Cervical, supraclavicular, and axillary nodes normal. 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 Chest discomfort consistent with progressive angina in a patient with a previous drug-eluting stent 2. Coronary artery disease with previous stent 3. Hyperlipidemia 4. Obesity  PLAN: Admit and check serial enzymes. Keep n.p.o. for possible further testing. He may require repeat catheterization to determine the patency of the right coronary artery stent although his history he has some atypical features to it.  Signed: Darden Palmer MD [facility]  11/24/2012, 1:55 AM

## 2012-11-24 NOTE — ED Notes (Signed)
Attempted to call report.  RN to call back.  Pt continues to be pain free at this time.Awaiting pharmacy to dose heparin.

## 2012-11-25 ENCOUNTER — Encounter (HOSPITAL_COMMUNITY)
Admission: EM | Disposition: A | Discharge status: Home / Self Care | Payer: Self-pay | Source: Home / Self Care | Attending: Cardiology

## 2012-11-25 DIAGNOSIS — G4733 Obstructive sleep apnea (adult) (pediatric): Secondary | ICD-10-CM

## 2012-11-25 DIAGNOSIS — I2510 Atherosclerotic heart disease of native coronary artery without angina pectoris: Secondary | ICD-10-CM

## 2012-11-25 DIAGNOSIS — R072 Precordial pain: Principal | ICD-10-CM

## 2012-11-25 DIAGNOSIS — R9439 Abnormal result of other cardiovascular function study: Secondary | ICD-10-CM

## 2012-11-25 DIAGNOSIS — E119 Type 2 diabetes mellitus without complications: Secondary | ICD-10-CM

## 2012-11-25 HISTORY — PX: LEFT HEART CATHETERIZATION WITH CORONARY ANGIOGRAM: SHX5451

## 2012-11-25 LAB — CBC
HCT: 34.7 % — ABNORMAL LOW (ref 39.0–52.0)
HCT: 36.5 % — ABNORMAL LOW (ref 39.0–52.0)
Hemoglobin: 12.1 g/dL — ABNORMAL LOW (ref 13.0–17.0)
Hemoglobin: 12.5 g/dL — ABNORMAL LOW (ref 13.0–17.0)
MCH: 27.6 pg (ref 26.0–34.0)
MCHC: 34.9 g/dL (ref 30.0–36.0)
MCHC: 34.2 g/dL (ref 30.0–36.0)
MCV: 80.4 fL (ref 78.0–100.0)
RDW: 13.1 % (ref 11.5–15.5)

## 2012-11-25 LAB — PROTIME-INR: INR: 1.03 (ref 0.00–1.49)

## 2012-11-25 LAB — CREATININE, SERUM
GFR calc Af Amer: >90 mL/min (ref >90)
GFR calc non Af Amer: >90 mL/min (ref >90)

## 2012-11-25 LAB — POCT ACTIVATED CLOTTING TIME: Activated Clotting Time: 154 seconds

## 2012-11-25 SURGERY — LEFT HEART CATHETERIZATION WITH CORONARY ANGIOGRAM
Anesthesia: LOCAL

## 2012-11-25 MED ORDER — HEPARIN SODIUM (PORCINE) 5000 UNIT/ML IJ SOLN
5000.0000 [IU] | Freq: Three times a day (TID) | INTRAMUSCULAR | Status: DC
  Administered 2012-11-25: 5000 [IU] via SUBCUTANEOUS

## 2012-11-25 MED ORDER — SODIUM CHLORIDE 0.9 % IV SOLN: INTRAVENOUS | Status: AC

## 2012-11-25 MED ORDER — HEPARIN (PORCINE) IN NACL 2-0.9 UNIT/ML-% IJ SOLN
INTRAMUSCULAR | Status: AC
  Filled 2012-11-25: qty 1000

## 2012-11-25 MED ORDER — VERAPAMIL HCL 2.5 MG/ML IV SOLN
INTRAVENOUS | Status: AC
  Filled 2012-11-25: qty 2

## 2012-11-25 MED ORDER — ISOSORBIDE MONONITRATE ER 60 MG PO TB24
120.0000 mg | ORAL_TABLET | Freq: Every day | ORAL | Status: DC
Start: 2012-11-26 — End: 2012-11-26
  Administered 2012-11-26: 120 mg via ORAL
  Filled 2012-11-25: qty 2

## 2012-11-25 MED ORDER — ONDANSETRON HCL 4 MG/2ML IJ SOLN: 4.0000 mg | Freq: Four times a day (QID) | INTRAMUSCULAR | Status: DC | PRN

## 2012-11-25 MED ORDER — LIDOCAINE HCL (PF) 1 % IJ SOLN
INTRAMUSCULAR | Status: AC
  Filled 2012-11-25: qty 30

## 2012-11-25 MED ORDER — HEPARIN SODIUM (PORCINE) 1000 UNIT/ML IJ SOLN
INTRAMUSCULAR | Status: AC
  Filled 2012-11-25: qty 1

## 2012-11-25 MED ORDER — ONDANSETRON HCL 4 MG/2ML IJ SOLN
INTRAMUSCULAR | Status: AC
  Filled 2012-11-25: qty 2

## 2012-11-25 MED ORDER — FENTANYL CITRATE 0.05 MG/ML IJ SOLN
INTRAMUSCULAR | Status: AC
  Filled 2012-11-25: qty 2

## 2012-11-25 MED ORDER — ACETAMINOPHEN 325 MG PO TABS: 650.0000 mg | ORAL_TABLET | ORAL | Status: DC | PRN

## 2012-11-25 MED ORDER — MIDAZOLAM HCL 2 MG/2ML IJ SOLN
INTRAMUSCULAR | Status: AC
  Filled 2012-11-25: qty 2

## 2012-11-25 NOTE — Progress Notes (Signed)
Utilization review completed.  

## 2012-11-25 NOTE — CV Procedure (Addendum)
   Cardiac Catheterization Procedure Note  Name: John May MRN: [id number] DOB: 25-Jun-1966  Procedure: Left Heart Cath, Selective Coronary Angiography  Indication: Unstable angina   Procedural Details: The right wrist was prepped, draped, and anesthetized with 1% lidocaine. Using the modified Seldinger technique, a 5 French sheath was introduced into the right radial artery. 3 mg of verapamil was administered through the sheath, weight-based unfractionated heparin was administered intravenously. The RCA was difficult to engage and there was probable catheter-induced spasm when engaged by the JR3.5 with ST elevation and chest pain. Given difficulty manipulating catheters from the wrist and ST elevation/chset pain, the right groin was sterilely prepped and draped and the case was completed via right femoral access.  There were no immediate procedural complications. The RCA was noted to be patent (suspect event earlier was catheter-induced spasm).  A TR band was used for radial hemostasis at the completion of the procedure.  The patient was transferred to the post catheterization recovery area for further monitoring.  Procedural Findings: Hemodynamics: AO 104/70 LV 117/15  Coronary angiography: Coronary dominance: right  Left mainstem: Luminal irregularities.  Left anterior descending (LAD): Diffuse moderate luminal irregularities.  50% proximal LAD stenosis, 50% and 60% mid LAD stenoses, up to 70% diffuse distal LAD stenosis.  Small to moderate D1 with diffuse disease, up to 80% distally. Similar appearance to prior cath.   Left circumflex (LCx): Small to moderate ramus with 70% mid-vessel stenosis.  Large OM1 with 50% proximal stenosis.  Moderate OM2 with 30-40% stenosis.  AV LCx with 50% distal stenosis.   Right coronary artery (RCA): Patent proximal RCA stent with 20-30% mid vessel stenosis. PLV with diffuse luminal irregularities, up to 50% stenosis.  PDA was a small to moderate vessel  with 90% mid-vessel stenosis.  The PDA appeared the same on the prior cath.   Left ventriculography: Not done.   Final Conclusions:  Diffuse branch and distal vessel CAD.  Appearance is similar to the prior cath.  Would favor aggressive medical management. Increase Imdur to 120 mg daily.  Will keep overnight and see how he does on medical regimen, probably home in the morning.   Marca Ancona 11/25/2012, 1:56 PM

## 2012-11-25 NOTE — H&P (View-Only) (Signed)
SUBJECTIVE: The patient is doing well today.  He has chronic chest heaviness and atypical features of diffuse arm numbness/ tingling.  This has been longstanding and did not improve with PCI 12/13.  I have reviewed Dr Ludwig Clarks office note which is consistent with this.  The patient reports that he has seen neurology without significant abnormalities found.    Marland Kitchen amLODipine  10 mg Oral Daily  . aspirin EC  81 mg Oral Daily  . cholecalciferol  1,000 Units Oral Daily  . clopidogrel  75 mg Oral Daily  . isosorbide mononitrate  60 mg Oral Daily  . lisinopril  40 mg Oral Daily  . nebivolol  20 mg Oral Daily  . niacin  500 mg Oral QHS   And  . simvastatin  20 mg Oral QHS  . regadenoson  0.4 mg Intravenous Once  . sodium chloride  3 mL Intravenous Q12H  . spironolactone  25 mg Oral Daily      OBJECTIVE: Physical Exam: Filed Vitals:   11/24/12 0130 11/24/12 0145 11/24/12 0200 11/24/12 0309  BP: 136/82 133/84 140/88 132/79  Pulse: 63 67 61 56  Temp:    97.7 F (36.5 C)  TempSrc:      Resp: 14 18 14 16   Height:    5\' 8"  (1.727 m)  Weight:    196 lb 6.4 oz (89.086 kg)  SpO2: 98% 100% 99% 100%    Intake/Output Summary (Last 24 hours) at 11/24/12 0745 Last data filed at 11/24/12 0500  Gross per 24 hour  Intake     18 ml  Output      0 ml  Net     18 ml    Telemetry reveals sinus rhythm  GEN- The patient is well appearing, alert and oriented x 3 today.   Head- normocephalic, atraumatic Eyes-  Sclera clear, conjunctiva pink Ears- hearing intact Oropharynx- clear Neck- supple, no JVP Lymph- no cervical lymphadenopathy Lungs- Clear to ausculation bilaterally, normal work of breathing Heart- Regular rate and rhythm, no murmurs, rubs or gallops, PMI not laterally displaced GI- soft, NT, ND, + BS Extremities- no clubbing, cyanosis, or edema Skin- no rash or lesion Psych- euthymic mood, full affect Neuro- strength and sensation are intact  LABS: Basic Metabolic  Panel:  Recent Labs  11/24/12 11/24/12 0221  NA 142 137  K 3.7 4.3  CL 105 103  CO2  --  25  GLUCOSE 105* 117*  BUN 13 13  CREATININE 1.10 1.00  CALCIUM  --  8.7   Liver Function Tests:  Recent Labs  11/24/12 0221  AST 18  ALT 19  ALKPHOS 50  BILITOT 0.2*  PROT 6.6  ALBUMIN 3.4*   No results found for this basename: LIPASE, AMYLASE,  in the last 72 hours CBC:  Recent Labs  11/23/12 2128 11/24/12  WBC 8.7  --   NEUTROABS 1.8  --   HGB 13.0 12.2*  HCT 37.7* 36.0*  MCV 81.6  --   PLT 255  --    Cardiac Enzymes:  Recent Labs  11/24/12 0221  TROPONINI <0.30   BNP: No components found with this basename: POCBNP,  D-Dimer: No results found for this basename: DDIMER,  in the last 72 hours Hemoglobin A1C: No results found for this basename: HGBA1C,  in the last 72 hours Fasting Lipid Panel:  Recent Labs  11/24/12 0222  CHOL 159  HDL 28*  LDLCALC 101*  TRIG 148  CHOLHDL 5.7   Thyroid Function  Tests: No results found for this basename: TSH, T4TOTAL, FREET3, T3FREE, THYROIDAB,  in the last 72 hours Anemia Panel: No results found for this basename: VITAMINB12, FOLATE, FERRITIN, TIBC, IRON, RETICCTPCT,  in the last 72 hours  RADIOLOGY: Dg Chest 2 View  11/23/2012  *RADIOLOGY REPORT*  Clinical Data: Mid chest pain off and on for several months.  CHEST - 2 VIEW  Comparison: 11/20/2011  Findings: Shallow inspiration. The heart size and pulmonary vascularity are normal. The lungs appear clear and expanded without focal air space disease or consolidation. No blunting of the costophrenic angles.  No pneumothorax.  Mediastinal contours appear intact.  No significant changes since the previous study.  IMPRESSION: No evidence of active pulmonary disease.  Shallow inspiration.   Original Report Authenticated By: Burman Nieves, M.D.     ASSESSMENT AND PLAN:  Active Problems:   Unstable angina   CAD (coronary artery disease)  1. Atypical chest pain Unlikely  cardiac I think that stress testing at this time is appropriate however.  IF low risk, medical management and discharge are planned.  2. CAD As above Could consider adding ranexa for a short trial to see if his pain improves with this. Otherwise, his medical regimen is reasonably optimized  If low risk myoview, would discharge later today to follow-up with PCP and Dr Jens Som as an outpatient   Hillis Range, MD 11/24/2012 7:45 AM

## 2012-11-25 NOTE — Progress Notes (Signed)
Patient Name: John May Date of Encounter: 11/25/2012     Principal Problem:   Precordial pain Active Problems:   Hypertension   Hyperlipidemia   CAD (coronary artery disease)   Abnormal stress test   Diabetes mellitus, new onset   OSA on CPAP    SUBJECTIVE: Feels well this AM. No chest pain overnight.   OBJECTIVE  Filed Vitals:   11/24/12 1010 11/24/12 1400 11/24/12 2100 11/25/12 0500  BP: 131/74 113/61 111/67 113/73  Pulse: 82 67 68 56  Temp:  97.9 F (36.6 C) 97.5 F (36.4 C) 97.6 F (36.4 C)  TempSrc:  Oral    Resp:  18 18 18   Height:      Weight:      SpO2:  96% 97% 97%    Intake/Output Summary (Last 24 hours) at 11/25/12 1191 Last data filed at 11/24/12 2100  Gross per 24 hour  Intake    840 ml  Output   1400 ml  Net   -560 ml   Weight change:   PHYSICAL EXAM  General: Well developed, well nourished, in no acute distress. Head: Normocephalic, atraumatic, sclera non-icteric, no xanthomas, nares are without discharge.  Neck: Supple without bruits or JVD. Lungs:   Resp regular and unlabored, CTA. Heart: RRR no s3, s4, or murmurs. Abdomen: Soft, non-tender, non-distended, BS + x 4.  Msk:  Strength and tone appears normal for age. Extremities:  No clubbing, cyanosis or edema. DP/PT/Radials 2+ and equal bilaterally. Neuro: Alert and oriented X 3. Moves all extremities spontaneously. Psych: Normal affect.  LABS:  Recent Labs     11/24/12  1229  11/25/12  0425  WBC  5.3  5.9  HGB  12.2*  12.1*  HCT  35.4*  34.7*  MCV  80.5  79.2  PLT  228  211    Recent Labs Lab 11/24/12 11/24/12 0221  NA 142 137  K 3.7 4.3  CL 105 103  CO2  --  25  BUN 13 13  CREATININE 1.10 1.00  CALCIUM  --  8.7  PROT  --  6.6  BILITOT  --  0.2*  ALKPHOS  --  50  ALT  --  19  AST  --  18  GLUCOSE 105* 117*   Recent Labs     11/24/12  0221  HGBA1C  6.9*   Recent Labs     11/24/12  0221  11/24/12  0746  11/24/12  1413  TROPONINI  <0.30  <0.30  <0.30    Recent Labs     11/24/12  0222  CHOL  159  HDL  28*  LDLCALC  101*  TRIG  148  CHOLHDL  5.7    Recent Labs  11/24/12 0221  TSH 2.421     TELE: Sinus bradycardia/NSR, 50-60 bpm  ECG: Sinus bradycardia, 52 bpm, TWIs III, aVF improved from yesterday, no ST/T changes otherwise   Radiology/Studies:  Dg Chest 2 View  11/23/2012  *RADIOLOGY REPORT*  Clinical Data: Mid chest pain off and on for several months.  CHEST - 2 VIEW  Comparison: 11/20/2011  Findings: Shallow inspiration. The heart size and pulmonary vascularity are normal. The lungs appear clear and expanded without focal air space disease or consolidation. No blunting of the costophrenic angles.  No pneumothorax.  Mediastinal contours appear intact.  No significant changes since the previous study.  IMPRESSION: No evidence of active pulmonary disease.  Shallow inspiration.   Original Report Authenticated By: Burman Nieves,  M.D.    Nm Myocar Multi W/spect W/wall Motion / Ef  11/24/2012  *RADIOLOGY REPORT*  Clinical Data:  Chest pain.  Previous myocardial infarct. Hypertension.  MYOCARDIAL IMAGING WITH SPECT (REST AND PHARMACOLOGIC-STRESS) GATED LEFT VENTRICULAR WALL MOTION STUDY LEFT VENTRICULAR EJECTION FRACTION  Technique:  Standard myocardial SPECT imaging was performed after resting intravenous injection of 10 mCi Tc-31m sestamibi. Subsequently, intravenous infusion of Lexiscan was performed under the supervision of the Cardiology staff.  At peak effect of the drug, 30 mCi Tc-72m sestamibi was injected intravenously and standard myocardial SPECT  imaging was performed.  Quantitative gated imaging was also performed to evaluate left ventricular wall motion, and estimate left ventricular ejection fraction.  Comparison:  None.  Findings: Exam quality is good. SPECT images show a small to moderate area of reversible myocardial perfusion in the anteroseptal wall which is suspicious for an area of myocardial ischemia.  Decreased  inferior wall myocardial activity is seen on both the stress and resting images, which may be due to diaphragmatic attenuation, although inferior wall scar cannot be excluded.  Gated left ventricular wall motion study shows a mild inferior wall hypokinesis versus diaphragmatic attenuation.  The calculated left ventricular ejection fraction is 61%.  IMPRESSION:  1.  Small to moderate area of reversibility in the anteroseptal wall, suspicious for inducible myocardial ischemia. 2.  Inferior wall diaphragmatic attenuation versus myocardial scar. 3.  Calculated left ejection fraction of 61%.   Original Report Authenticated By: Myles Rosenthal, M.D.     Current Medications:  . amLODipine  10 mg Oral Daily  . aspirin EC  81 mg Oral Daily  . cholecalciferol  1,000 Units Oral Daily  . clopidogrel  75 mg Oral Daily  . diazepam  5 mg Oral On Call  . isosorbide mononitrate  60 mg Oral Daily  . lisinopril  40 mg Oral Daily  . nebivolol  20 mg Oral Daily  . niacin  500 mg Oral QHS   And  . simvastatin  20 mg Oral QHS  . sodium chloride  3 mL Intravenous Q12H  . sodium chloride  3 mL Intravenous Q12H  . spironolactone  25 mg Oral Daily    ASSESSMENT AND PLAN:  1. Precordial pain, abnormal stress test 2. CAD 3. HLD 4. HTN 5. OSA on CPAP 6. Type 2 DM, new onset  Patient underwent Lexiscan Myoview yesterday which revealed a small-moderate area of reversibility in the anteroseptal wall, inferior diaphragmatic attenuation vs myocardial ischemia, LVEF 61%. After discussing with Drs. Allred, Cooper and [place], the plan has been made to pursue diagnostic cardiac catheterization to evaluate for flow-limiting CAD. He is NPO this AM. Precath orders entered. VSS. Asymptomatic. Further recommendations to be made after cath. Of note, Hgb A1C this admission returned at 6.9%. Will need to start on oral hypoglycemic post-cath and follow-up with PCP. Continue ASA, Plavix, ACEi, BB, spiro, statin, Imdur and NTG SL PRN.     Signed, R. Hurman Horn, PA-C 11/25/2012, 8:08 AM' Patient to cath lab prior to being seen. Olga Millers

## 2012-11-25 NOTE — Interval H&P Note (Signed)
History and Physical Interval Note:  11/25/2012 12:25 PM  John May  has presented today for surgery, with the diagnosis of Chest pain  The various methods of treatment have been discussed with the patient and family. After consideration of risks, benefits and other options for treatment, the patient has consented to  Procedure(s): LEFT HEART CATHETERIZATION WITH CORONARY ANGIOGRAM (N/A) as a surgical intervention .  The patient's history has been reviewed, patient examined, no change in status, stable for surgery.  I have reviewed the patient's chart and labs.  Questions were answered to the patient's satisfaction.     Dublin Cantero [employer]

## 2012-11-26 ENCOUNTER — Encounter (HOSPITAL_COMMUNITY): Payer: Self-pay | Admitting: Physician Assistant

## 2012-11-26 LAB — CBC
HCT: 34.6 % — ABNORMAL LOW (ref 39.0–52.0)
Hemoglobin: 11.8 g/dL — ABNORMAL LOW (ref 13.0–17.0)
MCH: 27.6 pg (ref 26.0–34.0)
RBC: 4.28 MIL/uL (ref 4.22–5.81)

## 2012-11-26 MED ORDER — NITROGLYCERIN 0.4 MG SL SUBL: 0.4000 mg | SUBLINGUAL_TABLET | SUBLINGUAL | Status: DC | PRN

## 2012-11-26 MED ORDER — ISOSORBIDE MONONITRATE ER 60 MG PO TB24: 120.0000 mg | ORAL_TABLET | Freq: Every day | ORAL | Status: DC

## 2012-11-26 NOTE — Discharge Summary (Signed)
Discharge Summary   Patient ID: John May MRN: [id number], DOB/AGE: 04-11-1966 47 y.o. Admit date: 11/23/2012 D/C date:     11/26/2012  Primary Cardiologist: Jens Som  Primary Discharge Diagnoses:  1. Precordial pain with abnormal stress test/CAD - cath 3/18 with diffuse branch and distal vessel CAD, aggressive med rx recommended, Imdur increased - history: MI 01/2010 unsuccessful PCI of RCA, s/p PCI/DES to mid RCA in 05/2012 2. HTN 3. Hyperlipidemia 4. Lymphocytosis on initial CBC - instructed to f/u PCP 5. Type 2 diabetes mellitus, new onset - A1C 6.9, instructed to f/u PCP  Secondary Discharge Diagnoses:  1. OSA on CPAP   Hospital Course: Mr. Avina is a 47 y/o M with history of HTN, HL, OSA, CAD (MI 01/2010 unsuccessful PCI of RCA, s/p PCI/DES to mid RCA in 05/2012) who presented to [facility] with chest pain on 11/24/12. The pain was worse while riding in his truck and bouncing. However, he also noted exertional chest discomfort. On day of admission he had midsternal CP for 20-30 minutes associated with SOB while doing laundry so came to the ER. EKG showed previous inferior infarct, nonacute. He was placed on heparin for concern for [place]. Troponins were negative. He underwent stress testing 3/17 that was abnormal with small to moderate area of reversibility in the anteroseptal wall, suspicious for inducible myocardial ischemia, EF 61%. He went on to have cath 3/18 showing appearance similar to prior cath with diffuse branch and distal vessel CAD. Aggressive med rx was recommended. Imdur was increased. He does have an elevated A1C with probable new onset DM (A1C 6.9) and lymphocytosis on admit CBC for which he was instructed to f/u PCP. Dr. Jens Som has seen and examined him and feels he is stable for discharge.   Discharge Vitals: Blood pressure 132/78, pulse 72, temperature 98 F (36.7 C), temperature source Oral, resp. rate 18, height 5\' 8"  (1.727 m), weight 196 lb 6.4 oz (89.086  kg), SpO2 97.00%.  Labs: Lab Results  Component Value Date   WBC 6.1 11/26/2012   HGB 11.8* 11/26/2012   HCT 34.6* 11/26/2012   MCV 80.8 11/26/2012   PLT 214 11/26/2012     Recent Labs Lab 11/24/12 0221 11/25/12 1636  NA 137  --   K 4.3  --   CL 103  --   CO2 25  --   BUN 13  --   CREATININE 1.00 0.97  CALCIUM 8.7  --   PROT 6.6  --   BILITOT 0.2*  --   ALKPHOS 50  --   ALT 19  --   AST 18  --   GLUCOSE 117*  --     Recent Labs  11/24/12 0221 11/24/12 0746 11/24/12 1413  TROPONINI <0.30 <0.30 <0.30   Lab Results  Component Value Date   CHOL 159 11/24/2012   HDL 28* 11/24/2012   LDLCALC 101* 11/24/2012   TRIG 148 11/24/2012     Diagnostic Studies/Procedures   Cath as noted above. See full report for details.  Dg Chest 2 View 11/23/2012  *RADIOLOGY REPORT*  Clinical Data: Mid chest pain off and on for several months.  CHEST - 2 VIEW  Comparison: 11/20/2011  Findings: Shallow inspiration. The heart size and pulmonary vascularity are normal. The lungs appear clear and expanded without focal air space disease or consolidation. No blunting of the costophrenic angles.  No pneumothorax.  Mediastinal contours appear intact.  No significant changes since the previous study.  IMPRESSION: No  evidence of active pulmonary disease.  Shallow inspiration.   Original Report Authenticated By: Burman Nieves, M.D.    Nm Myocar Multi W/spect W/wall Motion / Ef 11/24/2012  *RADIOLOGY REPORT*  Clinical Data:  Chest pain.  Previous myocardial infarct. Hypertension.  MYOCARDIAL IMAGING WITH SPECT (REST AND PHARMACOLOGIC-STRESS) GATED LEFT VENTRICULAR WALL MOTION STUDY LEFT VENTRICULAR EJECTION FRACTION  Technique:  Standard myocardial SPECT imaging was performed after resting intravenous injection of 10 mCi Tc-14m sestamibi. Subsequently, intravenous infusion of Lexiscan was performed under the supervision of the Cardiology staff.  At peak effect of the drug, 30 mCi Tc-57m sestamibi was injected  intravenously and standard myocardial SPECT  imaging was performed.  Quantitative gated imaging was also performed to evaluate left ventricular wall motion, and estimate left ventricular ejection fraction.  Comparison:  None.  Findings: Exam quality is good. SPECT images show a small to moderate area of reversible myocardial perfusion in the anteroseptal wall which is suspicious for an area of myocardial ischemia.  Decreased inferior wall myocardial activity is seen on both the stress and resting images, which may be due to diaphragmatic attenuation, although inferior wall scar cannot be excluded.  Gated left ventricular wall motion study shows a mild inferior wall hypokinesis versus diaphragmatic attenuation.  The calculated left ventricular ejection fraction is 61%.  IMPRESSION:  1.  Small to moderate area of reversibility in the anteroseptal wall, suspicious for inducible myocardial ischemia. 2.  Inferior wall diaphragmatic attenuation versus myocardial scar. 3.  Calculated left ejection fraction of 61%.   Original Report Authenticated By: Myles Rosenthal, M.D.     Discharge Medications     Medication List    TAKE these medications       amLODipine 10 MG tablet  Commonly known as:  NORVASC  Take 10 mg by mouth daily.     aspirin EC 81 MG tablet  Take 81 mg by mouth daily.     BYSTOLIC 20 MG Tabs  Generic drug:  Nebivolol HCl  Take 20 mg by mouth daily.     cholecalciferol 1000 UNITS tablet  Commonly known as:  VITAMIN D  Take 1,000 Units by mouth daily.     clopidogrel 75 MG tablet  Commonly known as:  PLAVIX  Take 1 tablet (75 mg total) by mouth daily.     isosorbide mononitrate 60 MG 24 hr tablet  Commonly known as:  IMDUR  Take 2 tablets (120 mg total) by mouth daily.     lisinopril 40 MG tablet  Commonly known as:  PRINIVIL,ZESTRIL  Take 40 mg by mouth daily.     niacin-simvastatin 500-20 MG 24 hr tablet  Commonly known as:  SIMCOR  Take 1 tablet by mouth at bedtime.      nitroGLYCERIN 0.4 MG SL tablet  Commonly known as:  NITROSTAT  Place 1 tablet (0.4 mg total) under the tongue every 5 (five) minutes as needed for chest pain (up to 3 doses).     spironolactone 25 MG tablet  Commonly known as:  ALDACTONE  Take 25 mg by mouth daily.        Disposition   The patient will be discharged in stable condition to home. Discharge Orders   Future Appointments Provider Department Dept Phone   01/02/2013 8:15 AM Lewayne Bunting, MD [facility] Main Office [place]) [phone]   02/27/2013 8:45 AM Lewayne Bunting, MD [facility] Main Office [place]) [phone]   Future Orders Complete By Expires     Diet - low  sodium heart healthy  As directed     Increase activity slowly  As directed     Comments:      No personal driving for 2 days. No lifting over 5 lbs for 1 week. No sexual activity for 1 week. You may return to work on 12/03/12. Keep procedure site clean & dry. If you notice increased pain, swelling, bleeding or pus, call/return!  You may shower, but no soaking baths/hot tubs/pools for 1 week.      Follow-up Information   Follow up with Primary Care Doctor. (Your A1c blood sugar test indicates that you likely have diabetes. You will also had a slight increase in number of lymphocytes on your blood count on admission that will need to be repeated. Please see primary care doctor for further evaluation.)       Follow up with Olga Millers, MD. (01/02/13 at 8:15am)    Contact information:   [address] [place], [place] [address] [place] [address] [phone]         Duration of Discharge Encounter: Greater than 30 minutes including physician and PA time.  Signed, Adith Tejada PA-C 11/26/2012, 11:03 AM

## 2012-11-26 NOTE — Progress Notes (Signed)
Patient Name: John May Date of Encounter: 11/26/2012     Principal Problem:   Precordial pain Active Problems:   Hypertension   Hyperlipidemia   CAD (coronary artery disease)   Abnormal stress test   Diabetes mellitus, new onset   OSA on CPAP    SUBJECTIVE: No chest pain or dyspnea   OBJECTIVE  Filed Vitals:   11/25/12 1800 11/25/12 2000 11/26/12 0000 11/26/12 0400  BP: 110/60 124/76 96/56 112/68  Pulse: 59 81 75 63  Temp:  97.7 F (36.5 C) 97.8 F (36.6 C) 98 F (36.7 C)  TempSrc:      Resp:  18 18 18   Height:      Weight:      SpO2:  95% 97% 97%    Intake/Output Summary (Last 24 hours) at 11/26/12 0704 Last data filed at 11/26/12 0500  Gross per 24 hour  Intake   1380 ml  Output   1700 ml  Net   -320 ml   Weight change:   PHYSICAL EXAM  General: Well developed, well nourished, in no acute distress. Head: Normocephalic, atraumatic Neck: Supple  Lungs:   CTA. Heart: RRR no s3, s4, or murmurs. Abdomen: Soft, non-tender, non-distended; right groin with no hematoma or bruit Extremities:  No edema.  Neuro: Alert and oriented X 3. Moves all extremities spontaneously.   LABS:  Recent Labs     11/25/12  1636  11/26/12  0420  WBC  5.6  6.1  HGB  12.5*  11.8*  HCT  36.5*  34.6*  MCV  80.4  80.8  PLT  230  214    Recent Labs Lab 11/24/12 11/24/12 0221 11/25/12 1636  NA 142 137  --   K 3.7 4.3  --   CL 105 103  --   CO2  --  25  --   BUN 13 13  --   CREATININE 1.10 1.00 0.97  CALCIUM  --  8.7  --   PROT  --  6.6  --   BILITOT  --  0.2*  --   ALKPHOS  --  50  --   ALT  --  19  --   AST  --  18  --   GLUCOSE 105* 117*  --    Recent Labs     11/24/12  0221  HGBA1C  6.9*   Recent Labs     11/24/12  0221  11/24/12  0746  11/24/12  1413  TROPONINI  <0.30  <0.30  <0.30   Recent Labs     11/24/12  0222  CHOL  159  HDL  28*  LDLCALC  101*  TRIG  148  CHOLHDL  5.7    Recent Labs  11/24/12 0221  TSH 2.421     TELE:  Sinus bradycardia/NSR, 50-60 bpm     Radiology/Studies:  Dg Chest 2 View  11/23/2012  *RADIOLOGY REPORT*  Clinical Data: Mid chest pain off and on for several months.  CHEST - 2 VIEW  Comparison: 11/20/2011  Findings: Shallow inspiration. The heart size and pulmonary vascularity are normal. The lungs appear clear and expanded without focal air space disease or consolidation. No blunting of the costophrenic angles.  No pneumothorax.  Mediastinal contours appear intact.  No significant changes since the previous study.  IMPRESSION: No evidence of active pulmonary disease.  Shallow inspiration.   Original Report Authenticated By: Burman Nieves, M.D.    Nm Myocar Multi W/spect W/wall Motion /  Ef  11/24/2012  *RADIOLOGY REPORT*  Clinical Data:  Chest pain.  Previous myocardial infarct. Hypertension.  MYOCARDIAL IMAGING WITH SPECT (REST AND PHARMACOLOGIC-STRESS) GATED LEFT VENTRICULAR WALL MOTION STUDY LEFT VENTRICULAR EJECTION FRACTION  Technique:  Standard myocardial SPECT imaging was performed after resting intravenous injection of 10 mCi Tc-24m sestamibi. Subsequently, intravenous infusion of Lexiscan was performed under the supervision of the Cardiology staff.  At peak effect of the drug, 30 mCi Tc-30m sestamibi was injected intravenously and standard myocardial SPECT  imaging was performed.  Quantitative gated imaging was also performed to evaluate left ventricular wall motion, and estimate left ventricular ejection fraction.  Comparison:  None.  Findings: Exam quality is good. SPECT images show a small to moderate area of reversible myocardial perfusion in the anteroseptal wall which is suspicious for an area of myocardial ischemia.  Decreased inferior wall myocardial activity is seen on both the stress and resting images, which may be due to diaphragmatic attenuation, although inferior wall scar cannot be excluded.  Gated left ventricular wall motion study shows a mild inferior wall hypokinesis versus  diaphragmatic attenuation.  The calculated left ventricular ejection fraction is 61%.  IMPRESSION:  1.  Small to moderate area of reversibility in the anteroseptal wall, suspicious for inducible myocardial ischemia. 2.  Inferior wall diaphragmatic attenuation versus myocardial scar. 3.  Calculated left ejection fraction of 61%.   Original Report Authenticated By: Myles Rosenthal, M.D.     Current Medications:  . amLODipine  10 mg Oral Daily  . aspirin EC  81 mg Oral Daily  . cholecalciferol  1,000 Units Oral Daily  . clopidogrel  75 mg Oral Daily  . heparin  5,000 Units Subcutaneous Q8H  . isosorbide mononitrate  120 mg Oral Daily  . lisinopril  40 mg Oral Daily  . nebivolol  20 mg Oral Daily  . niacin  500 mg Oral QHS   And  . simvastatin  20 mg Oral QHS  . sodium chloride  3 mL Intravenous Q12H  . spironolactone  25 mg Oral Daily    ASSESSMENT AND PLAN:  1. Precordial pain, abnormal stress test 2. CAD 3. HLD 4. HTN 5. OSA on CPAP 6. Type 2 DM, new onset  Cath results noted; continue medical therapy; DC today on preadmission meds and fu with me in 4-6 weeks. Patient with elevated Hgb A1C; also with lymphocytosis on CBC; would repeat following DC and fu with primary care for these issues. >30 min PA and physician time D2 Olga Millers 7:10 AM

## 2012-11-26 NOTE — Discharge Summary (Signed)
See progress notes Reise Gladney  

## 2012-11-26 NOTE — Progress Notes (Signed)
Pt provided with dc instructions and education. Pt verbalized understanding. Pt educated on all medications and able to teach back learning. Pt provided with work excuse. No questions at this time. IV removed with tip intact. Heart monitor cleaned and returned to front. Levonne Spiller, RN

## 2012-12-01 ENCOUNTER — Telehealth: Payer: Self-pay | Admitting: Cardiology

## 2012-12-01 NOTE — Telephone Encounter (Signed)
TCM; patient states he continues to have some SOB and mild chest pain.  Patient states it is the same symptoms that he had prior to cath; states his pain would be worse if he were at work but he has not returned to work yet. Patient speaks clearly without noticeable SOB; states he feels like he needs more rest.  Patient encouraged to rest and follow D/C instructions. Patient aware of appointment 4/25 with Dr. Jens Som and was encouraged to follow up with PCP.

## 2012-12-04 ENCOUNTER — Telehealth: Payer: Self-pay | Admitting: Cardiology

## 2012-12-04 NOTE — Telephone Encounter (Signed)
New Prob    Pt has some papers he needs signed by physician (for disability). Would like to speak to nurse.

## 2012-12-04 NOTE — Telephone Encounter (Signed)
Spoke with pt, he was recently in the hosp. He states he works for the city of [employer], he states that he has to go through their medical department to return to work. They have taken him out of work and told him that they want him to file for disability retirement. 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 I explained to pt that we may not be able to give him disability because of his heart. He may not be able to work his current job but can still work. Pt is very anxious. i told him i would forward the information to dr Jens Som to find out what he would like to do and call him back. Pt agreed with this plan. Will forward for dr Jens Som review

## 2012-12-04 NOTE — Telephone Encounter (Signed)
Spoke with pt, he is going to drop the paperwork off at [facility] at the [address] office.

## 2012-12-04 NOTE — Telephone Encounter (Signed)
Forward all cardiac information/records to whoever makes decision about disability. Olga Millers

## 2012-12-24 ENCOUNTER — Telehealth: Payer: Self-pay | Admitting: Cardiology

## 2012-12-24 NOTE — Telephone Encounter (Signed)
Spoke with human resources, the pt is not disabled from a cardiac standpoint. Per discharge summ the pt was released to return to work 12-03-12.

## 2012-12-24 NOTE — Telephone Encounter (Signed)
New Problem:    Called in because they were trying to process the disability paperwork Dr. Jens Som filled out for the patient but were unable to complete them because some sections were incomplete.  Please call back.

## 2013-01-02 ENCOUNTER — Ambulatory Visit (INDEPENDENT_AMBULATORY_CARE_PROVIDER_SITE_OTHER): Payer: 59 | Admitting: Cardiology

## 2013-01-02 ENCOUNTER — Encounter: Payer: Self-pay | Admitting: Cardiology

## 2013-01-02 VITALS — BP 122/86 | HR 74

## 2013-01-02 DIAGNOSIS — E785 Hyperlipidemia, unspecified: Secondary | ICD-10-CM

## 2013-01-02 DIAGNOSIS — I2510 Atherosclerotic heart disease of native coronary artery without angina pectoris: Secondary | ICD-10-CM

## 2013-01-02 DIAGNOSIS — I10 Essential (primary) hypertension: Secondary | ICD-10-CM

## 2013-01-02 DIAGNOSIS — R072 Precordial pain: Secondary | ICD-10-CM

## 2013-01-02 NOTE — Patient Instructions (Addendum)
Your physician wants you to follow-up in: 6 MONTHS WITH DR CRENSHAW You will receive a reminder letter in the mail two months in advance. If you don't receive a letter, please call our office to schedule the follow-up appointment.  

## 2013-01-02 NOTE — Assessment & Plan Note (Signed)
Patient continues to have chest pain. He also complains of dyspnea. He does have some coronary disease but his LV function is normal. I am not convinced all of his symptoms are cardiac related. We have performed an extensive cardiac evaluation. Continue medical therapy.

## 2013-01-02 NOTE — Assessment & Plan Note (Signed)
Blood pressure is controlled. Continue present medications. 

## 2013-01-02 NOTE — Assessment & Plan Note (Signed)
Continue aspirin, Plavix and statin. 

## 2013-01-02 NOTE — Assessment & Plan Note (Signed)
Continue statin. 

## 2013-01-02 NOTE — Progress Notes (Signed)
HPI: 47 year-old male with past medical history of coronary artery disease for FU. Patient suffered a myocardial infarction in May of 2011. Attempt at PCI of an occluded RCA was not successful and the patient was treated medically. Ejection fraction was 45-50%. Patient had an echocardiogram in May of 2011 that showed an ejection fraction of 55-60%. There was mild hypokinesis of the basal and mid inferior wall. The left atrium was mildly dilated. The right ventricle was mildly dilated. Patient continued to have symptoms and had cath 9/13 that revealed Normal LM, 40 mid and 60 distal LAD, 30 diagonal, 40 lcx, 99 mid RCA; EF 55; patient had DES to RCA at that time. Patient again admitted with continuing CP in March of 2013; enzymes neg; myoview 3/14 with EF 61; anteroseptal ischemia; inferior MI vs diaphragm. Cath 3/14 - LM luminal irreg, LAD 50 prox, 50 and 60 mid, 70 distal, D1 with 80 distal, Ramus with 70 mid, OM1 with 50, 50 distal left circ; RCA with patent stent, 90 PDA; medical therapy recommended. Since DC, he continues to describe intermittent dyspnea both with exertion and at rest. He continues to have chest pain which is described as a burning in his nipple. He has chest pain both with exertion and at rest.   Current Outpatient Prescriptions  Medication Sig Dispense Refill  . amLODipine (NORVASC) 10 MG tablet Take 10 mg by mouth daily.      Marland Kitchen aspirin EC 81 MG tablet Take 81 mg by mouth daily.      . cholecalciferol (VITAMIN D) 1000 UNITS tablet Take 1,000 Units by mouth daily.      . clopidogrel (PLAVIX) 75 MG tablet Take 1 tablet (75 mg total) by mouth daily.  30 tablet  6  . isosorbide mononitrate (IMDUR) 60 MG 24 hr tablet Take 2 tablets (120 mg total) by mouth daily.      Marland Kitchen lisinopril (PRINIVIL,ZESTRIL) 40 MG tablet Take 40 mg by mouth daily.      . Nebivolol HCl (BYSTOLIC) 20 MG TABS Take 20 mg by mouth daily.      . niacin-simvastatin (SIMCOR) 500-20 MG 24 hr tablet Take 1 tablet by  mouth at bedtime.      . nitroGLYCERIN (NITROSTAT) 0.4 MG SL tablet Place 1 tablet (0.4 mg total) under the tongue every 5 (five) minutes as needed for chest pain (up to 3 doses).      Marland Kitchen spironolactone (ALDACTONE) 25 MG tablet Take 25 mg by mouth daily.        No current facility-administered medications for this visit.     Past Medical History  Diagnosis Date  . Hypertension   . CAD (coronary artery disease)     a. 01/2010 - MI with unsuccessful PCI of RCA. b. s/p PCI/DES to mid RCA 05/2012. c. Cath 11/2012 for abnl stress test - diffuse branch and distal vessel CAD for med rx.    . Hyperlipidemia   . OSA on CPAP   . Paresthesias     a. bilat upper & lower extremities - intermittently present since 2011.  . Diabetes mellitus     a. A1c 6.9 in 11/2012.    Past Surgical History  Procedure Laterality Date  . Coronary angioplasty with stent placement  05/14/2012    "1"  . Patellar tendon repair  1990's    left    History   Social History  . Marital Status: Married    Spouse Name: N/A    Number of  Children: 2  . Years of Education: N/A   Occupational History  .  [employer]   Social History Main Topics  . Smoking status: Former Smoker -- 0.50 packs/day for 18 years    Types: Cigarettes    Quit date: 11/26/2003  . Smokeless tobacco: Never Used  . Alcohol Use: No  . Drug Use: No  . Sexually Active: Yes   Other Topics Concern  . Not on file   Social History Narrative  . No narrative on file    ROS: no fevers or chills, productive cough, hemoptysis, dysphasia, odynophagia, melena, hematochezia, dysuria, hematuria, rash, seizure activity, orthopnea, PND, pedal edema, claudication. Remaining systems are negative.  Physical Exam: Well-developed well-nourished in no acute distress.  Skin is warm and dry.  HEENT is normal.  Neck is supple.  Chest is clear to auscultation with normal expansion.  Cardiovascular exam is regular rate and rhythm.  Abdominal exam  nontender or distended. No masses palpated. Extremities show no edema. neuro grossly intact

## 2013-02-09 ENCOUNTER — Other Ambulatory Visit: Payer: Self-pay | Admitting: *Deleted

## 2013-02-09 MED ORDER — NIACIN-SIMVASTATIN ER 1000-20 MG PO TB24: 1.0000 | ORAL_TABLET | Freq: Every day | ORAL | Status: DC

## 2013-02-25 ENCOUNTER — Other Ambulatory Visit: Payer: Self-pay | Admitting: Pharmacist Clinician (PhC)/ Clinical Pharmacy Specialist

## 2013-02-25 MED ORDER — NEBIVOLOL HCL 20 MG PO TABS: ORAL_TABLET | ORAL | Status: DC

## 2013-02-27 ENCOUNTER — Encounter: Payer: Self-pay | Admitting: Cardiology

## 2013-02-27 ENCOUNTER — Ambulatory Visit (INDEPENDENT_AMBULATORY_CARE_PROVIDER_SITE_OTHER): Payer: 59 | Admitting: Cardiology

## 2013-02-27 VITALS — BP 130/90 | HR 58 | Ht 67.0 in | Wt 201.0 lb

## 2013-02-27 DIAGNOSIS — E785 Hyperlipidemia, unspecified: Secondary | ICD-10-CM

## 2013-02-27 DIAGNOSIS — I10 Essential (primary) hypertension: Secondary | ICD-10-CM

## 2013-02-27 DIAGNOSIS — R072 Precordial pain: Secondary | ICD-10-CM

## 2013-02-27 DIAGNOSIS — I2510 Atherosclerotic heart disease of native coronary artery without angina pectoris: Secondary | ICD-10-CM

## 2013-02-27 NOTE — Progress Notes (Signed)
HPI: 47 year-old male with past medical history of coronary artery disease for FU. Patient suffered a myocardial infarction in May of 2011. Attempt at PCI of an occluded RCA was not successful and the patient was treated medically. Ejection fraction was 45-50%. Patient had an echocardiogram in May of 2011 that showed an ejection fraction of 55-60%. There was mild hypokinesis of the basal and mid inferior wall. The left atrium was mildly dilated. The right ventricle was mildly dilated. Patient continued to have symptoms and had cath 9/13 that revealed Normal LM, 40 mid and 60 distal LAD, 30 diagonal, 40 lcx, 99 mid RCA; EF 55; patient had DES to RCA at that time. Patient again admitted with continuing CP in March of 2013; enzymes neg; myoview 3/14 with EF 61; anteroseptal ischemia; inferior MI vs diaphragm. Cath 3/14 - LM luminal irreg, LAD 50 prox, 50 and 60 mid, 70 distal, D1 with 80 distal, Ramus with 70 mid, OM1 with 50, 50 distal left circ; RCA with patent stent, 90 PDA; medical therapy recommended. I last saw him in April of 2014. Since then, he continues to have chest pain both at rest and with activities but slightly improved; also with dyspnea at rest and with activities. Also with back pain, leg cramping.    Current Outpatient Prescriptions  Medication Sig Dispense Refill  . amLODipine (NORVASC) 10 MG tablet Take 10 mg by mouth daily.      Marland Kitchen aspirin EC 81 MG tablet Take 81 mg by mouth daily.      . cholecalciferol (VITAMIN D) 1000 UNITS tablet Take 50,000 Units by mouth once a week.       . clopidogrel (PLAVIX) 75 MG tablet Take 1 tablet (75 mg total) by mouth daily.  30 tablet  6  . isosorbide mononitrate (IMDUR) 60 MG 24 hr tablet Take 2 tablets (120 mg total) by mouth daily.      Marland Kitchen lisinopril (PRINIVIL,ZESTRIL) 40 MG tablet Take 40 mg by mouth daily.      Marland Kitchen METFORMIN HCL PO Take 1 tablet by mouth daily.      . Nebivolol HCl (BYSTOLIC) 20 MG TABS Take 1 tablet by mouth every day, need  office visit for further refills.  30 tablet  0  . niacin-simvastatin (SIMCOR) 1000-20 MG 24 hr tablet Take 1 tablet by mouth at bedtime.  30 tablet  2  . nitroGLYCERIN (NITROSTAT) 0.4 MG SL tablet Place 1 tablet (0.4 mg total) under the tongue every 5 (five) minutes as needed for chest pain (up to 3 doses).      Marland Kitchen spironolactone (ALDACTONE) 25 MG tablet Take 25 mg by mouth daily.        No current facility-administered medications for this visit.     Past Medical History  Diagnosis Date  . Hypertension   . CAD (coronary artery disease)     a. 01/2010 - MI with unsuccessful PCI of RCA. b. s/p PCI/DES to mid RCA 05/2012. c. Cath 11/2012 for abnl stress test - diffuse branch and distal vessel CAD for med rx.    . Hyperlipidemia   . OSA on CPAP   . Paresthesias     a. bilat upper & lower extremities - intermittently present since 2011.  . Diabetes mellitus     a. A1c 6.9 in 11/2012.    Past Surgical History  Procedure Laterality Date  . Coronary angioplasty with stent placement  05/14/2012    "1"  . Patellar tendon repair  [id number]  left    History   Social History  . Marital Status: Married    Spouse Name: N/A    Number of Children: 2  . Years of Education: N/A   Occupational History  .  [employer]   Social History Main Topics  . Smoking status: Former Smoker -- 0.50 packs/day for 18 years    Types: Cigarettes    Quit date: 11/26/2003  . Smokeless tobacco: Never Used  . Alcohol Use: No  . Drug Use: No  . Sexually Active: Yes   Other Topics Concern  . Not on file   Social History Narrative  . No narrative on file    ROS: no fevers or chills, productive cough, hemoptysis, dysphasia, odynophagia, melena, hematochezia, dysuria, hematuria, rash, seizure activity, pedal edema, claudication. Remaining systems are negative.  Physical Exam: Well-developed well-nourished in no acute distress.  Skin is warm and dry.  HEENT is normal.  Neck is supple.  Chest is  clear to auscultation with normal expansion.  Cardiovascular exam is regular rate and rhythm.  Abdominal exam nontender or distended. No masses palpated. Extremities show no edema. neuro grossly intact  ECG sinus bradycardia at a rate of 58. Left ventricular hypertrophy. Prior inferior infarct.

## 2013-02-27 NOTE — Assessment & Plan Note (Signed)
Extremely difficult to evaluate. Continue medical therapy.

## 2013-02-27 NOTE — Assessment & Plan Note (Signed)
Continue aspirin and statin. His symptoms are difficult to evaluate. He has chronic dyspnea, chest pain and other complaints. I am not convinced all of his symptoms are cardiac related. He had a recent cardiac catheterization as outlined above. Continue medical therapy.

## 2013-02-27 NOTE — Assessment & Plan Note (Signed)
Continue present blood pressure medications. 

## 2013-02-27 NOTE — Patient Instructions (Addendum)
Your physician wants you to follow-up in: 6 MONTHS WITH DR CRENSHAW You will receive a reminder letter in the mail two months in advance. If you don't receive a letter, please call our office to schedule the follow-up appointment.  

## 2013-02-27 NOTE — Assessment & Plan Note (Signed)
Continue statin. 

## 2013-03-31 ENCOUNTER — Telehealth: Payer: Self-pay | Admitting: Cardiology

## 2013-03-31 NOTE — Telephone Encounter (Signed)
Spoke with pt, okay given for tooth extraction. He will make sure they know he is taking plavix.

## 2013-03-31 NOTE — Telephone Encounter (Signed)
New Prob     Pt wants to know if its ok for him to get his tooth extracted. Scheduled today 2:00. Please call.

## 2013-04-20 ENCOUNTER — Telehealth: Payer: Self-pay | Admitting: Cardiology

## 2013-04-20 NOTE — Telephone Encounter (Signed)
Received document from [facility]. ** May stop Plavix at end of Sept 2014; cannot stop ASA-signed B. Crenshaw Faxed to [phone]/ 8.7.2014 11:45 am/djc

## 2013-04-21 ENCOUNTER — Other Ambulatory Visit: Payer: Self-pay | Admitting: Cardiology

## 2013-05-15 ENCOUNTER — Other Ambulatory Visit: Payer: Self-pay | Admitting: Cardiovascular Disease

## 2013-05-15 NOTE — Telephone Encounter (Signed)
Medication refill refused, as patient is no longer under prescriber care.

## 2013-06-10 ENCOUNTER — Other Ambulatory Visit: Payer: Self-pay | Admitting: Cardiovascular Disease

## 2013-06-11 ENCOUNTER — Other Ambulatory Visit: Payer: Self-pay | Admitting: Cardiology

## 2013-06-11 NOTE — Telephone Encounter (Signed)
Patient is now a patient of Dr. Jens Som

## 2013-06-12 ENCOUNTER — Other Ambulatory Visit: Payer: Self-pay | Admitting: Cardiovascular Disease

## 2013-06-12 ENCOUNTER — Other Ambulatory Visit (HOSPITAL_COMMUNITY): Payer: Self-pay | Admitting: Nurse Practitioner

## 2013-06-13 ENCOUNTER — Other Ambulatory Visit: Payer: Self-pay | Admitting: Cardiology

## 2013-06-16 ENCOUNTER — Telehealth: Payer: Self-pay | Admitting: *Deleted

## 2013-06-16 MED ORDER — CLOPIDOGREL BISULFATE 75 MG PO TABS: 75.0000 mg | ORAL_TABLET | Freq: Every day | ORAL | Status: DC

## 2013-06-16 NOTE — Telephone Encounter (Signed)
Spoke with Beatris Ship for Dr Jens Som, patient is to continue plavix, will refill, I notified patient.

## 2013-07-19 ENCOUNTER — Other Ambulatory Visit: Payer: Self-pay | Admitting: Cardiology

## 2013-08-27 ENCOUNTER — Ambulatory Visit (INDEPENDENT_AMBULATORY_CARE_PROVIDER_SITE_OTHER): Payer: 59 | Admitting: Cardiology

## 2013-08-27 ENCOUNTER — Encounter: Payer: Self-pay | Admitting: Cardiology

## 2013-08-27 VITALS — BP 169/68 | HR 56 | Ht 67.0 in | Wt 208.6 lb

## 2013-08-27 DIAGNOSIS — E7800 Pure hypercholesterolemia, unspecified: Secondary | ICD-10-CM

## 2013-08-27 DIAGNOSIS — I2510 Atherosclerotic heart disease of native coronary artery without angina pectoris: Secondary | ICD-10-CM

## 2013-08-27 DIAGNOSIS — I10 Essential (primary) hypertension: Secondary | ICD-10-CM

## 2013-08-27 MED ORDER — ATORVASTATIN CALCIUM 80 MG PO TABS: 80.0000 mg | ORAL_TABLET | Freq: Every day | ORAL | Status: DC

## 2013-08-27 NOTE — Assessment & Plan Note (Signed)
Continue aspirin and statin. His symptoms are difficult to evaluate but unchanged. He has chronic dyspnea, chest pain and other complaints. I am not convinced all of his symptoms are cardiac related. He had a previous cardiac catheterization as outlined above. Continue medical therapy.

## 2013-08-27 NOTE — Assessment & Plan Note (Signed)
Blood pressure is mildly elevated.however he states it is controlled at home. I have asked him to follow this and we will increase medications as needed.

## 2013-08-27 NOTE — Progress Notes (Signed)
HPI: FU coronary artery disease. Patient suffered a myocardial infarction in May of 2011. Attempt at PCI of an occluded RCA was not successful and the patient was treated medically. Ejection fraction was 45-50%. Patient had an echocardiogram in May of 2011 that showed an ejection fraction of 55-60%. There was mild hypokinesis of the basal and mid inferior wall. The left atrium was mildly dilated. The right ventricle was mildly dilated. Patient continued to have symptoms and had cath 9/13 that revealed Normal LM, 40 mid and 60 distal LAD, 30 diagonal, 40 lcx, 99 mid RCA; EF 55; patient had DES to RCA at that time. Patient again admitted with continuing CP in March of 2013; enzymes neg; myoview 3/14 with EF 61; anteroseptal ischemia; inferior MI vs diaphragm. Cath 3/14 - LM luminal irreg, LAD 50 prox, 50 and 60 mid, 70 distal, D1 with 80 distal, Ramus with 70 mid, OM1 with 50, 50 distal left circ; RCA with patent stent, 90 PDA; medical therapy recommended. I last saw him in June of 2014. Since then, he had some chest pain and dyspnea with activities which is unchanged. No syncope.   Current Outpatient Prescriptions  Medication Sig Dispense Refill  . amLODipine (NORVASC) 10 MG tablet TAKE 1 TABLET BY MOUTH EVERY DAY  30 tablet  1  . aspirin EC 81 MG tablet Take 81 mg by mouth daily.      Marland Kitchen BYSTOLIC 20 MG TABS TAKE 1 TABLET BY MOUTH DAILY  30 tablet  0  . cholecalciferol (VITAMIN D) 1000 UNITS tablet Take 50,000 Units by mouth once a week.       . clopidogrel (PLAVIX) 75 MG tablet Take 1 tablet (75 mg total) by mouth daily.  30 tablet  6  . isosorbide mononitrate (IMDUR) 30 MG 24 hr tablet TAKE 1 TABLET BY MOUTH ONCE DAILY  30 tablet  3  . lisinopril (PRINIVIL,ZESTRIL) 40 MG tablet Take 40 mg by mouth daily.      Marland Kitchen METFORMIN HCL PO Take 1 tablet by mouth daily.      . niacin-simvastatin (SIMCOR) 1000-20 MG 24 hr tablet Take 1 tablet by mouth at bedtime.  30 tablet  2  . nitroGLYCERIN (NITROSTAT)  0.4 MG SL tablet Place 1 tablet (0.4 mg total) under the tongue every 5 (five) minutes as needed for chest pain (up to 3 doses).      Marland Kitchen spironolactone (ALDACTONE) 25 MG tablet Take 25 mg by mouth daily.        No current facility-administered medications for this visit.     Past Medical History  Diagnosis Date  . Hypertension   . CAD (coronary artery disease)     a. 01/2010 - MI with unsuccessful PCI of RCA. b. s/p PCI/DES to mid RCA 05/2012. c. Cath 11/2012 for abnl stress test - diffuse branch and distal vessel CAD for med rx.    . Hyperlipidemia   . OSA on CPAP   . Paresthesias     a. bilat upper & lower extremities - intermittently present since 2011.  . Diabetes mellitus     a. A1c 6.9 in 11/2012.    Past Surgical History  Procedure Laterality Date  . Coronary angioplasty with stent placement  05/14/2012    "1"  . Patellar tendon repair  1990's    left    History   Social History  . Marital Status: Married    Spouse Name: N/A    Number of Children: 2  .  Years of Education: N/A   Occupational History  .  Unemployed   Social History Main Topics  . Smoking status: Former Smoker -- 0.50 packs/day for 18 years    Types: Cigarettes    Quit date: 11/26/2003  . Smokeless tobacco: Never Used  . Alcohol Use: No  . Drug Use: No  . Sexual Activity: Yes   Other Topics Concern  . Not on file   Social History Narrative  . No narrative on file    ROS: no fevers or chills, productive cough, hemoptysis, dysphasia, odynophagia, melena, hematochezia, dysuria, hematuria, rash, seizure activity, orthopnea, PND, pedal edema, claudication. Remaining systems are negative.  Physical Exam: Well-developed well-nourished in no acute distress.  Skin is warm and dry.  HEENT is normal.  Neck is supple.  Chest is clear to auscultation with normal expansion.  Cardiovascular exam is regular rate and rhythm.  Abdominal exam nontender or distended. No masses palpated. Extremities show no  edema. neuro grossly intact  ECG sinus rhythm at a rate of 56. Left ventricular hypertrophy. Inferior infarct.

## 2013-08-27 NOTE — Patient Instructions (Signed)
Your physician wants you to follow-up in: 6 MONTHS WITH DR Jens Som You will receive a reminder letter in the mail two months in advance. If you don't receive a letter, please call our office to schedule the follow-up appointment.   STOP SIMCOR  START LIPITOR 80 MG ONCE DAILY  Your physician recommends that you return for lab work in: 6 WEEKS- DO NOT EAT PRIOR TO LAB WORK

## 2013-08-27 NOTE — Assessment & Plan Note (Signed)
Discontinue Simcor. Begin Lipitor 80 mg daily. Check lipids and liver in 6 weeks.

## 2013-08-31 ENCOUNTER — Other Ambulatory Visit: Payer: Self-pay | Admitting: Cardiology

## 2013-10-05 ENCOUNTER — Other Ambulatory Visit: Payer: Self-pay | Admitting: Cardiology

## 2013-10-06 ENCOUNTER — Other Ambulatory Visit (INDEPENDENT_AMBULATORY_CARE_PROVIDER_SITE_OTHER): Payer: 59

## 2013-10-06 DIAGNOSIS — I2510 Atherosclerotic heart disease of native coronary artery without angina pectoris: Secondary | ICD-10-CM

## 2013-10-06 DIAGNOSIS — E7800 Pure hypercholesterolemia, unspecified: Secondary | ICD-10-CM

## 2013-10-06 LAB — HEPATIC FUNCTION PANEL
ALBUMIN: 3.7 g/dL (ref 3.5–5.2)
ALT: 30 U/L (ref 0–53)
AST: 13 U/L (ref 0–37)
Alkaline Phosphatase: 60 U/L (ref 39–117)
Bilirubin, Direct: 0 mg/dL (ref 0.0–0.3)
TOTAL PROTEIN: 7 g/dL (ref 6.0–8.3)
Total Bilirubin: 0.6 mg/dL (ref 0.3–1.2)

## 2013-10-06 LAB — LIPID PANEL
Cholesterol: 123 mg/dL (ref 0–200)
HDL: 28.9 mg/dL — ABNORMAL LOW (ref >39.00)
LDL CALC: 58 mg/dL (ref 0–99)
TRIGLYCERIDES: 183 mg/dL — AB (ref 0.0–149.0)
Total CHOL/HDL Ratio: 4
VLDL: 36.6 mg/dL (ref 0.0–40.0)

## 2013-10-07 ENCOUNTER — Encounter: Payer: Self-pay | Admitting: *Deleted

## 2013-10-12 ENCOUNTER — Other Ambulatory Visit: Payer: Self-pay | Admitting: Cardiology

## 2014-01-12 ENCOUNTER — Encounter: Payer: Self-pay | Admitting: *Deleted

## 2014-01-12 ENCOUNTER — Encounter: Payer: 59 | Attending: Physician Assistant | Admitting: *Deleted

## 2014-01-12 VITALS — Ht 67.0 in | Wt 210.4 lb

## 2014-01-12 DIAGNOSIS — Z713 Dietary counseling and surveillance: Secondary | ICD-10-CM | POA: Insufficient documentation

## 2014-01-12 DIAGNOSIS — I519 Heart disease, unspecified: Secondary | ICD-10-CM | POA: Insufficient documentation

## 2014-01-12 DIAGNOSIS — E119 Type 2 diabetes mellitus without complications: Secondary | ICD-10-CM

## 2014-01-12 DIAGNOSIS — Z9861 Coronary angioplasty status: Secondary | ICD-10-CM | POA: Insufficient documentation

## 2014-01-12 NOTE — Patient Instructions (Addendum)
Plan:  Aim for 3 Carb Choices per meal (45 grams) +/- 1 either way  Aim for 0-15 Carbs per snack if hungry  Include protein in moderation with your meals and snacks Consider reading food labels for Total Carbohydrate and Fat Grams of foods Consider  increasing your activity level by walking for 30 minutes daily as tolerated Continue checking BG at alternate times per day to include fasting and 2 hours after dinner as directed by MD  Continue taking medication as directed by MD  Consider [facility] CorporationProtein Bar Obtain plastic measuring cups to measure food Consider shopping at [employer] Always have protein with your carbohydrates
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
## 2014-01-26 ENCOUNTER — Other Ambulatory Visit: Payer: Self-pay | Admitting: *Deleted

## 2014-01-26 MED ORDER — SPIRONOLACTONE 25 MG PO TABS: 25.0000 mg | ORAL_TABLET | Freq: Every day | ORAL | Status: DC

## 2014-02-02 ENCOUNTER — Ambulatory Visit (INDEPENDENT_AMBULATORY_CARE_PROVIDER_SITE_OTHER): Payer: 59 | Admitting: Cardiovascular Disease

## 2014-02-02 ENCOUNTER — Encounter: Payer: Self-pay | Admitting: Cardiovascular Disease

## 2014-02-02 VITALS — BP 130/92 | HR 62 | Ht 67.0 in | Wt 209.7 lb

## 2014-02-02 DIAGNOSIS — I7090 Unspecified atherosclerosis: Secondary | ICD-10-CM

## 2014-02-02 DIAGNOSIS — Z9989 Dependence on other enabling machines and devices: Secondary | ICD-10-CM

## 2014-02-02 DIAGNOSIS — I2510 Atherosclerotic heart disease of native coronary artery without angina pectoris: Secondary | ICD-10-CM

## 2014-02-02 DIAGNOSIS — G4733 Obstructive sleep apnea (adult) (pediatric): Secondary | ICD-10-CM

## 2014-02-02 DIAGNOSIS — E785 Hyperlipidemia, unspecified: Secondary | ICD-10-CM

## 2014-02-02 DIAGNOSIS — I10 Essential (primary) hypertension: Secondary | ICD-10-CM
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
## 2014-02-02 NOTE — Assessment & Plan Note (Signed)
On statin therapy with his most recent lipid profile performed 10/06/13 revealing a total cholesterol of 123, LDL 58 and HDL of 29

## 2014-02-02 NOTE — Assessment & Plan Note (Signed)
Controlled on current medications 

## 2014-02-02 NOTE — Progress Notes (Signed)
02/02/2014 John May   [birth year]  [id number]  Primary Physician MASSENBURG,O'LAF, PA-C Primary Cardiologist: Runell GessJonathan J. Berry MD [name],[name],[name], [name]   HPI:  The patient is seen by Dr. Olga MillersBrian May in the office. 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 His EF subsequent to that normalized with mild hypokinesia of the inferior wall. Respiratory symptoms he was recathed 9/13 and had stenting of his RCA with a DES with moderate disease in his other vessels. A subsequent cardiac catheterization performed March 2014 showed moderate disease and medical therapy was recommended. Problems include hypertension, hyperlipidemia and diabetes. He said does not smoke nor is there a family history of heart disease. He saw Dr. Jens May 08/27/13 with complaints of occasional chest pain and dyspnea which have remained the same since that time. He was referred by his primary care physician for evaluation of fatigue, dyspnea and occasional chest pain.   Current Outpatient Prescriptions  Medication Sig Dispense Refill  . amLODipine (NORVASC) 10 MG tablet TAKE 1 TABLET BY MOUTH EVERY DAY  30 tablet  1  . aspirin EC 81 MG tablet Take 81 mg by mouth daily.      Marland Kitchen. atorvastatin (LIPITOR) 80 MG tablet Take 1 tablet (80 mg total) by mouth daily.  30 tablet  12  . BYSTOLIC 20 MG TABS TAKE 1 TABLET BY MOUTH EVERY DAY  30 tablet  3  . cholecalciferol (VITAMIN D) 1000 UNITS tablet Take 50,000 Units by mouth once a week.       . clopidogrel (PLAVIX) 75 MG tablet Take 1 tablet (75 mg total) by mouth daily.  30 tablet  6  . glyBURIDE (DIABETA) 1.25 MG tablet Take 1.25 mg by mouth daily with breakfast.      . isosorbide  mononitrate (IMDUR) 30 MG 24 hr tablet TAKE 1 TABLET BY MOUTH ONCE DAILY  30 tablet  3  . lisinopril (PRINIVIL,ZESTRIL) 40 MG tablet Take 40 mg by mouth daily.      Marland Kitchen. METFORMIN HCL PO Take 1 tablet by mouth daily.      . nitroGLYCERIN (NITROSTAT) 0.4 MG SL tablet Place 1 tablet (0.4 mg total) under the tongue every 5 (five) minutes as needed for chest pain (up to 3 doses).      Marland Kitchen. NITROSTAT 0.4 MG SL tablet PLACE 1 TABLET UNDER TONGUE EVERY 5 MINUTES X3 DOSES IF NO BETTER CALL 911  25 tablet  0  . sertraline (ZOLOFT) 50 MG tablet Take 50 mg by mouth daily.      Marland Kitchen. spironolactone (ALDACTONE) 25 MG tablet Take 1 tablet (25 mg total) by mouth daily.  60 tablet  6   No current facility-administered medications for this visit.    Allergies  Allergen Reactions  . Bee Venom Swelling  . Food Allergy Formula Swelling    "Crab in stuffing; I eat shrimp and I don't have problems"  . Crab [Shellfish Allergy]   . Nutritional Supplements Swelling    unknown    History   Social History  . Marital Status: Married    Spouse Name: N/A    Number of Children: 2  . Years of Education: N/A   Occupational History  .  Unemployed   Social History Main Topics  . Smoking status: Former Smoker -- 0.50 packs/day for 18 years    Types: Cigarettes    Quit date: 11/26/2003  . Smokeless tobacco: Never Used  . Alcohol Use: No  . Drug Use: No  . Sexual Activity: Yes   Other Topics Concern  . Not on file   Social History Narrative  . No narrative on file     Review of Systems: General: negative for chills, fever, night sweats or weight changes.  Cardiovascular: negative for chest pain, dyspnea on exertion, edema, orthopnea, palpitations, paroxysmal nocturnal dyspnea or shortness of breath Dermatological: negative for rash Respiratory: negative for cough or wheezing Urologic: negative for hematuria Abdominal: negative for nausea, vomiting, diarrhea, bright red blood per rectum, melena, or  hematemesis Neurologic: negative for visual changes, syncope, or dizziness All other systems reviewed and are otherwise negative except as noted above.    Blood pressure 130/92, pulse 62, height 5\' 7"  (1.702 m), weight 209 lb 11.2 oz (95.119 kg).  General appearance: alert and no distress Neck: no adenopathy, no carotid bruit, no JVD, supple, symmetrical, trachea midline and thyroid not enlarged, symmetric, no tenderness/mass/nodules Lungs: clear to auscultation bilaterally Heart: regular rate and rhythm, S1, S2 normal, no murmur, click, rub or gallop Extremities: extremities normal, atraumatic, no cyanosis or edema and 2+ pedal pulses bilaterally  EKG sinus rhythm at 62 without ST or T wave changes  ASSESSMENT AND PLAN:   CAD (coronary artery disease) The patient is seen by Dr. Olga Millers in the office. 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 His EF subsequent to that normalized with mild hypokinesia of the inferior wall. Respiratory symptoms he was recathed 9/13 and had stenting of his RCA with a DES with moderate disease in his other vessels. A subsequent cardiac catheterization performed March 2014 showed moderate disease and medical therapy was recommended. Problems include hypertension, hyperlipidemia and diabetes. He said does not smoke nor is there a family history of heart disease. He saw Dr. Jens Som 08/27/13 with complaints of occasional chest pain and dyspnea which have remained the same since that time.  Hyperlipidemia On statin therapy with his most recent lipid profile performed 10/06/13 revealing a total cholesterol of 123, LDL 58 and HDL of 29  Hypertension Controlled on current medications  OSA on CPAP On CPAP      Runell Gess MD [facility], [facility] 02/02/2014 9:04 AM

## 2014-02-02 NOTE — Patient Instructions (Signed)
See Dr Jens Som in 6 months

## 2014-02-02 NOTE — Assessment & Plan Note (Signed)
On CPAP. ?

## 2014-03-19 ENCOUNTER — Other Ambulatory Visit: Payer: Self-pay | Admitting: *Deleted

## 2014-03-19 MED ORDER — ISOSORBIDE MONONITRATE ER 30 MG PO TB24: ORAL_TABLET | ORAL | Status: DC

## 2014-03-19 MED ORDER — AMLODIPINE BESYLATE 10 MG PO TABS: ORAL_TABLET | ORAL | Status: DC

## 2014-03-24 ENCOUNTER — Other Ambulatory Visit: Payer: Self-pay | Admitting: *Deleted

## 2014-03-24 MED ORDER — LISINOPRIL 40 MG PO TABS: 40.0000 mg | ORAL_TABLET | Freq: Every day | ORAL | Status: DC

## 2014-05-21 ENCOUNTER — Other Ambulatory Visit: Payer: Self-pay | Admitting: *Deleted

## 2014-05-21 MED ORDER — CLOPIDOGREL BISULFATE 75 MG PO TABS: 75.0000 mg | ORAL_TABLET | Freq: Every day | ORAL | Status: DC

## 2014-08-19 ENCOUNTER — Encounter (HOSPITAL_COMMUNITY): Payer: Self-pay | Admitting: Cardiovascular Disease

## 2014-10-09 NOTE — Progress Notes (Signed)
HPI: FU coronary artery disease. Patient suffered a myocardial infarction in May of 2011. Attempt at PCI of an occluded RCA was not successful and the patient was treated medically. Ejection fraction was 45-50%. Patient had an echocardiogram in May of 2011 that showed an ejection fraction of 55-60%. There was mild hypokinesis of the basal and mid inferior wall. The left atrium was mildly dilated. The right ventricle was mildly dilated. Patient continued to have symptoms and had cath 9/13 that revealed Normal LM, 40 mid and 60 distal LAD, 30 diagonal, 40 lcx, 99 mid RCA; EF 55; patient had DES to RCA at that time. Patient again admitted with continuing CP in March of 2013; enzymes neg; myoview 3/14 with EF 61; anteroseptal ischemia; inferior MI vs diaphragm. Cath 3/14 - LM luminal irreg, LAD 50 prox, 50 and 60 mid, 70 distal, D1 with 80 distal, Ramus with 70 mid, OM1 with 50, 50 distal left circ; RCA with patent stent, 90 PDA; medical therapy recommended. Since I last saw him, his symptoms are essentially unchanged. He notes dyspnea with more moderate activities but not routine activities. No orthopnea, PND or pedal edema. No syncope. Occasional chest pain with moderate activities. Symptoms are also unchanged.  Current Outpatient Prescriptions  Medication Sig Dispense Refill  . amLODipine (NORVASC) 10 MG tablet TAKE 1 TABLET BY MOUTH EVERY DAY 30 tablet 3  . aspirin EC 81 MG tablet Take 81 mg by mouth daily.    Marland Kitchen atorvastatin (LIPITOR) 80 MG tablet Take 1 tablet (80 mg total) by mouth daily. 30 tablet 12  . cholecalciferol (VITAMIN D) 1000 UNITS tablet Take 50,000 Units by mouth once a week.     . clopidogrel (PLAVIX) 75 MG tablet Take 1 tablet (75 mg total) by mouth daily. 30 tablet 1  . isosorbide mononitrate (IMDUR) 30 MG 24 hr tablet TAKE 1 TABLET BY MOUTH ONCE DAILY 30 tablet 3  . lisinopril (PRINIVIL,ZESTRIL) 40 MG tablet Take 1 tablet (40 mg total) by mouth daily. 30 tablet 3  . METFORMIN  HCL PO Take 1 tablet by mouth daily.    . nitroGLYCERIN (NITROSTAT) 0.4 MG SL tablet Place 1 tablet (0.4 mg total) under the tongue every 5 (five) minutes as needed for chest pain (up to 3 doses).    Marland Kitchen NITROSTAT 0.4 MG SL tablet PLACE 1 TABLET UNDER TONGUE EVERY 5 MINUTES X3 DOSES IF NO BETTER CALL 911 25 tablet 0  . sertraline (ZOLOFT) 50 MG tablet Take 50 mg by mouth daily.    Marland Kitchen spironolactone (ALDACTONE) 25 MG tablet Take 1 tablet (25 mg total) by mouth daily. 60 tablet 6  . BYSTOLIC 20 MG TABS TAKE 1 TABLET BY MOUTH EVERY DAY 30 tablet 3  . glyBURIDE (DIABETA) 1.25 MG tablet Take 1.25 mg by mouth daily with breakfast.     No current facility-administered medications for this visit.     Past Medical History  Diagnosis Date  . Hypertension   . CAD (coronary artery disease)     a. 01/2010 - MI with unsuccessful PCI of RCA. b. s/p PCI/DES to mid RCA 05/2012. c. Cath 11/2012 for abnl stress test - diffuse branch and distal vessel CAD for med rx.    . Hyperlipidemia   . OSA on CPAP   . Paresthesias     a. bilat upper & lower extremities - intermittently present since 2011.  . Diabetes mellitus     a. A1c 6.9 in 11/2012.  Past Surgical History  Procedure Laterality Date  . Coronary angioplasty with stent placement  05/14/2012    "1"  . Patellar tendon repair  1990's    left  . Percutaneous coronary stent intervention (pci-s) N/A 05/14/2012    Procedure: PERCUTANEOUS CORONARY STENT INTERVENTION (PCI-S);  Surgeon: Kathleene Hazelhristopher D McAlhany, MD;  [facility];  Service: Cardiovascular;  Laterality: N/A;  . Left heart catheterization with coronary angiogram N/A 11/25/2012    Procedure: LEFT HEART CATHETERIZATION WITH CORONARY ANGIOGRAM;  Surgeon: Laurey Moralealton S McLean, MD;  [facility];  Service: Cardiovascular;  Laterality: N/A;    History   Social History  . Marital Status: Married    Spouse Name: N/A    Number of Children: 2  . Years of Education: N/A   Occupational  History  .  Unemployed   Social History Main Topics  . Smoking status: Former Smoker -- 0.50 packs/day for 18 years    Types: Cigarettes    Quit date: 11/26/2003  . Smokeless tobacco: Never Used  . Alcohol Use: No  . Drug Use: No  . Sexual Activity: Yes   Other Topics Concern  . Not on file   Social History Narrative    ROS: fatigue but no fevers or chills, productive cough, hemoptysis, dysphasia, odynophagia, melena, hematochezia, dysuria, hematuria, rash, seizure activity, orthopnea, PND, pedal edema, claudication. Remaining systems are negative.  Physical Exam: Well-developed well-nourished in no acute distress.  Skin is warm and dry.  HEENT is normal.  Neck is supple.  Chest is clear to auscultation with normal expansion.  Cardiovascular exam is regular rate and rhythm.  Abdominal exam nontender or distended. No masses palpated. Extremities show no edema. neuro grossly intact  ECG sinus rhythm at a rate of 61. Left ventricular hypertrophy. Prior inferior infarct. Inferior lateral T-wave inversion.

## 2014-10-11 ENCOUNTER — Encounter: Payer: Self-pay | Admitting: Cardiology

## 2014-10-11 ENCOUNTER — Ambulatory Visit (INDEPENDENT_AMBULATORY_CARE_PROVIDER_SITE_OTHER): Payer: 59 | Admitting: Cardiology

## 2014-10-11 VITALS — BP 140/70 | HR 61 | Ht 67.0 in | Wt 203.9 lb

## 2014-10-11 DIAGNOSIS — R072 Precordial pain: Secondary | ICD-10-CM

## 2014-10-11 DIAGNOSIS — I2510 Atherosclerotic heart disease of native coronary artery without angina pectoris: Secondary | ICD-10-CM

## 2014-10-11 DIAGNOSIS — I2583 Coronary atherosclerosis due to lipid rich plaque: Secondary | ICD-10-CM

## 2014-10-11 DIAGNOSIS — E785 Hyperlipidemia, unspecified: Secondary | ICD-10-CM

## 2014-10-11 NOTE — Assessment & Plan Note (Signed)
Symptoms are difficult to evaluate as they are chronic. However his electrocardiogram does show increased T-wave inversion in the inferior lateral leads. I will arrange a stress nuclear study for risk stratification.

## 2014-10-11 NOTE — Assessment & Plan Note (Signed)
Continue statin. Lipids and liver monitored by primary care. 

## 2014-10-11 NOTE — Assessment & Plan Note (Signed)
Continue aspirin, Plavix and statin. 

## 2014-10-11 NOTE — Assessment & Plan Note (Signed)
Continue present medications. Potassium and renal function monitored by primary care. 

## 2014-10-11 NOTE — Patient Instructions (Signed)
Your physician wants you to follow-up in: 6 MONTHS WITH DR Jens SomRENSHAW You will receive a reminder letter in the mail two months in advance. If you don't receive a letter, please call our office to schedule the follow-up appointment.   Your physician has requested that you have en exercise stress myoview. For further information please visit [email or web address]. Please follow instruction sheet, as given.  TAKE ALL MEDICINE

## 2014-10-25 ENCOUNTER — Other Ambulatory Visit: Payer: Self-pay | Admitting: Nurse Practitioner

## 2014-10-25 ENCOUNTER — Other Ambulatory Visit: Payer: Self-pay

## 2014-10-25 MED ORDER — AMLODIPINE BESYLATE 10 MG PO TABS: ORAL_TABLET | ORAL | Status: DC

## 2014-10-25 MED ORDER — ISOSORBIDE MONONITRATE ER 30 MG PO TB24: ORAL_TABLET | ORAL | Status: DC

## 2014-10-27 ENCOUNTER — Telehealth (HOSPITAL_COMMUNITY): Payer: Self-pay

## 2014-10-27 ENCOUNTER — Encounter (HOSPITAL_COMMUNITY): Payer: 59

## 2014-10-27 NOTE — Telephone Encounter (Signed)
Encounter complete. 

## 2014-10-28 ENCOUNTER — Encounter (HOSPITAL_COMMUNITY): Payer: 59

## 2014-10-29 ENCOUNTER — Ambulatory Visit (HOSPITAL_COMMUNITY)
Admission: RE | Admit: 2014-10-29 | Discharge: 2014-10-29 | Disposition: A | Discharge status: Home / Self Care | Payer: 59 | Source: Ambulatory Visit | Attending: Cardiology | Admitting: Cardiology

## 2014-10-29 DIAGNOSIS — R079 Chest pain, unspecified: Secondary | ICD-10-CM | POA: Insufficient documentation

## 2014-10-29 DIAGNOSIS — E785 Hyperlipidemia, unspecified: Secondary | ICD-10-CM | POA: Insufficient documentation

## 2014-10-29 DIAGNOSIS — R42 Dizziness and giddiness: Secondary | ICD-10-CM | POA: Insufficient documentation

## 2014-10-29 DIAGNOSIS — I2510 Atherosclerotic heart disease of native coronary artery without angina pectoris: Secondary | ICD-10-CM

## 2014-10-29 DIAGNOSIS — I10 Essential (primary) hypertension: Secondary | ICD-10-CM | POA: Diagnosis not present

## 2014-10-29 DIAGNOSIS — R0609 Other forms of dyspnea: Secondary | ICD-10-CM | POA: Diagnosis not present

## 2014-10-29 DIAGNOSIS — E119 Type 2 diabetes mellitus without complications: Secondary | ICD-10-CM | POA: Diagnosis not present

## 2014-10-29 DIAGNOSIS — Z8249 Family history of ischemic heart disease and other diseases of the circulatory system: Secondary | ICD-10-CM | POA: Insufficient documentation

## 2014-10-29 DIAGNOSIS — I2583 Coronary atherosclerosis due to lipid rich plaque: Secondary | ICD-10-CM

## 2014-10-29 DIAGNOSIS — R9431 Abnormal electrocardiogram [ECG] [EKG]: Secondary | ICD-10-CM | POA: Insufficient documentation

## 2014-10-29 DIAGNOSIS — R002 Palpitations: Secondary | ICD-10-CM | POA: Diagnosis not present

## 2014-10-29 DIAGNOSIS — Z87891 Personal history of nicotine dependence: Secondary | ICD-10-CM | POA: Insufficient documentation

## 2014-10-29 MED ORDER — TECHNETIUM TC 99M SESTAMIBI GENERIC - CARDIOLITE
10.5000 | Freq: Once | INTRAVENOUS | Status: AC | PRN
  Administered 2014-10-29: 11 via INTRAVENOUS

## 2014-10-29 MED ORDER — TECHNETIUM TC 99M SESTAMIBI GENERIC - CARDIOLITE
30.9000 | Freq: Once | INTRAVENOUS | Status: AC | PRN
  Administered 2014-10-29: 30.9 via INTRAVENOUS

## 2014-10-29 NOTE — Procedures (Addendum)
[name] [address] [place] [address] [phone]  Cardiology Nuclear Med Cammie SickleStudy  John May is a 49 y.o. male     MRN : [id number]     DOB: May 05, 1966  Procedure Date: 10/29/2014  Nuclear Med Background Indication for Stress Test:  Evaluation for Ischemia, Abnormal EKG and Abnormal ETT;Follow up CAD History:  CAD;MI x2;STENT/PTCA;Last NUC MPI on 11/24/2012-ischemia;EF=61% Cardiac Risk Factors: Family History - CAD, History of Smoking, Hypertension, Lipids, NIDDM and Overweight  Symptoms:  Chest Pain, Dizziness, DOE, Fatigue, Light-Headedness, Nausea and Palpitations   Nuclear Pre-Procedure Caffeine/Decaff Intake:  7:00pm NPO After: 5:00am   IV Site: R Forearm  IV 0.9% NS with Angio Cath:  22g  Chest Size (in):  46" IV Started by: Berdie OgrenAmanda Wease, RN  Height: 5\' 7"  (1.702 m)  Cup Size: n/a  BMI:  Body mass index is 31.79 kg/(m^2). Weight:  203 lb (92.08 kg)   Tech Comments:  n/a    Nuclear Med Study 1 or 2 day study: 1 day  Stress Test Type:  Stress  Order Authorizing Provider:  Olga MillersBrian Crenshaw, MD   Resting Radionuclide: Technetium 3569m Sestamibi  Resting Radionuclide Dose: 10.5 mCi   Stress Radionuclide:  Technetium 2469m Sestamibi  Stress Radionuclide Dose: 30.9 mCi           Stress Protocol Rest HR: 59 Stress HR: 137  Rest BP: 156/100 Stress BP: 187/99  Exercise Time (min): 9:33 METS: 10.9   Predicted Max HR: 174 bpm % Max HR: 78.74 bpm Rate Pressure Product: [id number]  Dose of Adenosine (mg):  n/a Dose of Lexiscan: n/a mg  Dose of Atropine (mg): n/a Dose of Dobutamine: n/a mcg/kg/min (at max HR)  Stress Test Technologist: Esperanza Sheetserry-Marie Martin, CCT Nuclear Technologist: Gonzella LexPam Phillips, CNMT   Rest Procedure:  Myocardial perfusion imaging was performed at rest 45 minutes following the intravenous administration of Technetium 7769m Sestamibi. Stress Procedure:  The patient performed treadmill exercise using a Bruce   Protocol for 9:33 minutes. The patient stopped due to SOB, Fatigue and Leg Fatigue and denied any chest pain.  There were no significant ST-T wave changes.  Technetium 1169m Sestamibi was injected IV at peak exercise and myocardial perfusion imaging was performed after a brief delay.  Transient Ischemic Dilatation (Normal <1.22):  1.12  QGS EDV:  82 ml QGS ESV:  39 ml LV Ejection Fraction: 53%   Rest ECG: NSR, anterior MI, inferior lateral TWI.  Stress ECG: No significant ST segment change suggestive of ischemia.  QPS Raw Data Images:  Acquisition technically good; normal left ventricular size. Stress Images:  There is decreased uptake in the distal anterior wall, apex and inferior wall. Rest Images:  There is decreased uptake in the inferior wall. Subtraction (SDS):  These findings are consistent with small prior inferior infarct and mild ischemia in the distal anterior wall/apex.  Impression Exercise Capacity:  Good exercise capacity. BP Response:  Normal blood pressure response. Clinical Symptoms:  The patient complained of chest pain. ECG Impression:  No significant ST segment change suggestive of ischemia. Comparison with Prior Nuclear Study: Compared to 11/24/12, distal anterior and apical ischemia new.  Overall Impression:  Low risk stress nuclear study with a small, mild intensity, reversible distal anterior/apical defect and small, moderate intensity, fixed inferior defect; findings c/w mild distal apical/anterior ischemia and small previous inferior infarct.  LV Wall Motion:  NL LV Function; NL Wall Motion   Olga MillersBrian Crenshaw, MD  10/29/2014  1:11 PM

## 2014-12-05 NOTE — Progress Notes (Signed)
HPI: FU coronary artery disease. Patient suffered a myocardial infarction in May of 2011. Attempt at PCI of an occluded RCA was not successful and the patient was treated medically. Ejection fraction was 45-50%. Patient had an echocardiogram in May of 2011 that showed an ejection fraction of 55-60%. There was mild hypokinesis of the basal and mid inferior wall. The left atrium was mildly dilated. The right ventricle was mildly dilated. Patient continued to have symptoms and had cath 9/13 that revealed Normal LM, 40 mid and 60 distal LAD, 30 diagonal, 40 lcx, 99 mid RCA; EF 55; patient had DES to RCA at that time. Patient again admitted with continuing CP in March of 2013; enzymes neg; myoview 3/14 with EF 61; anteroseptal ischemia; inferior MI vs diaphragm. Cath 3/14 - LM luminal irreg, LAD 50 prox, 50 and 60 mid, 70 distal, D1 with 80 distal, Ramus with 70 mid, OM1 with 50, 50 distal left circ; RCA with patent stent, 90 PDA; medical therapy recommended. Nuclear study 2/16 showed EF 53, mild distal anterior ischemia and small previous infarct. Since I last saw him, he continues to have problems with fatigue, dyspnea and intermittent chest pain. His symptoms are unchanged compared to previous.  Current Outpatient Prescriptions  Medication Sig Dispense Refill  . amLODipine (NORVASC) 10 MG tablet TAKE 1 TABLET BY MOUTH EVERY DAY 30 tablet 6  . aspirin EC 81 MG tablet Take 81 mg by mouth daily.    Marland Kitchen. atorvastatin (LIPITOR) 80 MG tablet Take 1 tablet (80 mg total) by mouth daily. 30 tablet 12  . BYSTOLIC 20 MG TABS TAKE 1 TABLET BY MOUTH EVERY DAY 30 tablet 3  . cholecalciferol (VITAMIN D) 1000 UNITS tablet Take 50,000 Units by mouth once a week.     . clopidogrel (PLAVIX) 75 MG tablet Take 1 tablet (75 mg total) by mouth daily. 30 tablet 1  . glyBURIDE (DIABETA) 1.25 MG tablet Take 1.25 mg by mouth daily with breakfast.    . isosorbide mononitrate (IMDUR) 30 MG 24 hr tablet TAKE 1 TABLET BY MOUTH  ONCE DAILY 30 tablet 6  . lisinopril (PRINIVIL,ZESTRIL) 40 MG tablet Take 1 tablet (40 mg total) by mouth daily. 30 tablet 3  . METFORMIN HCL PO Take 1 tablet by mouth daily.    . nitroGLYCERIN (NITROSTAT) 0.4 MG SL tablet Place 1 tablet (0.4 mg total) under the tongue every 5 (five) minutes as needed for chest pain (up to 3 doses).    Marland Kitchen. NITROSTAT 0.4 MG SL tablet PLACE 1 TABLET UNDER TONGUE EVERY 5 MINUTES X3 DOSES IF NO BETTER CALL 911 25 tablet 0  . sertraline (ZOLOFT) 50 MG tablet Take 50 mg by mouth daily.    Marland Kitchen. spironolactone (ALDACTONE) 25 MG tablet Take 1 tablet (25 mg total) by mouth daily. 60 tablet 6   No current facility-administered medications for this visit.     Past Medical History  Diagnosis Date  . Hypertension   . CAD (coronary artery disease)     a. 01/2010 - MI with unsuccessful PCI of RCA. b. s/p PCI/DES to mid RCA 05/2012. c. Cath 11/2012 for abnl stress test - diffuse branch and distal vessel CAD for med rx.    . Hyperlipidemia   . OSA on CPAP   . Paresthesias     a. bilat upper & lower extremities - intermittently present since 2011.  . Diabetes mellitus     a. A1c 6.9 in 11/2012.    Past  Surgical History  Procedure Laterality Date  . Coronary angioplasty with stent placement  05/14/2012    "1"  . Patellar tendon repair  1990's    left  . Percutaneous coronary stent intervention (pci-s) N/A 05/14/2012    Procedure: PERCUTANEOUS CORONARY STENT INTERVENTION (PCI-S);  Surgeon: Kathleene Hazel, MD;  Location: [facility] CATH LAB;  Service: Cardiovascular;  Laterality: N/A;  . Left heart catheterization with coronary angiogram N/A 11/25/2012    Procedure: LEFT HEART CATHETERIZATION WITH CORONARY ANGIOGRAM;  Surgeon: Laurey Morale, MD;  Location: [facility] CATH LAB;  Service: Cardiovascular;  Laterality: N/A;    History   Social History  . Marital Status: Married    Spouse Name: N/A  . Number of Children: 2  . Years of Education: N/A   Occupational History  .   Unemployed   Social History Main Topics  . Smoking status: Former Smoker -- 0.50 packs/day for 18 years    Types: Cigarettes    Quit date: 11/26/2003  . Smokeless tobacco: Never Used  . Alcohol Use: No  . Drug Use: No  . Sexual Activity: Yes   Other Topics Concern  . Not on file   Social History Narrative    ROS: no fevers or chills, productive cough, hemoptysis, dysphasia, odynophagia, melena, hematochezia, dysuria, hematuria, rash, seizure activity, orthopnea, PND, pedal edema, claudication. Remaining systems are negative.  Physical Exam: Well-developed well-nourished in no acute distress.  Skin is warm and dry.  HEENT is normal.  Neck is supple.  Chest is clear to auscultation with normal expansion.  Cardiovascular exam is regular rate and rhythm.  Abdominal exam nontender or distended. No masses palpated. Extremities show no edema. neuro grossly intact

## 2014-12-07 ENCOUNTER — Ambulatory Visit (INDEPENDENT_AMBULATORY_CARE_PROVIDER_SITE_OTHER): Payer: 59 | Admitting: Cardiology

## 2014-12-07 ENCOUNTER — Encounter: Payer: Self-pay | Admitting: Cardiology

## 2014-12-07 VITALS — BP 150/104 | HR 86 | Ht 67.0 in | Wt 208.9 lb

## 2014-12-07 DIAGNOSIS — I10 Essential (primary) hypertension: Secondary | ICD-10-CM | POA: Diagnosis not present

## 2014-12-07 DIAGNOSIS — E785 Hyperlipidemia, unspecified: Secondary | ICD-10-CM | POA: Diagnosis not present

## 2014-12-07 DIAGNOSIS — R9439 Abnormal result of other cardiovascular function study: Secondary | ICD-10-CM

## 2014-12-07 DIAGNOSIS — I2583 Coronary atherosclerosis due to lipid rich plaque: Secondary | ICD-10-CM

## 2014-12-07 DIAGNOSIS — I2510 Atherosclerotic heart disease of native coronary artery without angina pectoris: Secondary | ICD-10-CM | POA: Diagnosis not present

## 2014-12-07 NOTE — Assessment & Plan Note (Signed)
I reviewed his nuclear study with him in detail today. It appears to be low risk. Very difficult situation. He has known disease but also chronic symptoms that have not improved with stenting previously. I consider the stress test to be low risk. I discussed catheterization versus medical therapy and we will plan medical therapy for now. If his symptoms worsen we will proceed with cardiac catheterization. He is in agreement.

## 2014-12-07 NOTE — Assessment & Plan Note (Signed)
Patient has not taken his blood pressure medications today. I have asked him to follow his blood pressure at home and we will add medications if needed.

## 2014-12-07 NOTE — Patient Instructions (Signed)
Your physician recommends that you schedule a follow-up appointment in: 3 MONTHS WITH DR CRENSHAW  

## 2014-12-07 NOTE — Assessment & Plan Note (Signed)
Continue aspirin and statin. 

## 2014-12-07 NOTE — Assessment & Plan Note (Signed)
Continue statin. 

## 2015-05-03 NOTE — Progress Notes (Signed)
HPI: FU coronary artery disease. Patient suffered a myocardial infarction in May of 2011. Attempt at PCI of an occluded RCA was not successful and the patient was treated medically. Ejection fraction was 45-50%. Patient had an echocardiogram in May of 2011 that showed an ejection fraction of 55-60%. There was mild hypokinesis of the basal and mid inferior wall. The left atrium was mildly dilated. The right ventricle was mildly dilated. Patient continued to have symptoms and had cath 9/13 that revealed Normal LM, 40 mid and 60 distal LAD, 30 diagonal, 40 lcx, 99 mid RCA; EF 55; patient had DES to RCA at that time. Patient again admitted with continuing CP in March of 2013; enzymes neg; myoview 3/14 with EF 61; anteroseptal ischemia; inferior MI vs diaphragm. Cath 3/14 - LM luminal irreg, LAD 50 prox, 50 and 60 mid, 70 distal, D1 with 80 distal, Ramus with 70 mid, OM1 with 50, 50 distal left circ; RCA with patent stent, 90 PDA; medical therapy recommended. Nuclear study 2/16 showed EF 53, mild distal anterior ischemia and small previous infarct. Since I last saw him, he continues to have some dyspnea on exertion and exertional chest pain but unchanged in severity and frequency.  Current Outpatient Prescriptions  Medication Sig Dispense Refill  . amLODipine (NORVASC) 10 MG tablet TAKE 1 TABLET BY MOUTH EVERY DAY 30 tablet 6  . aspirin EC 81 MG tablet Take 81 mg by mouth daily.    Marland Kitchen atorvastatin (LIPITOR) 80 MG tablet Take 1 tablet (80 mg total) by mouth daily. 30 tablet 12  . BYSTOLIC 20 MG TABS TAKE 1 TABLET BY MOUTH EVERY DAY 30 tablet 3  . clopidogrel (PLAVIX) 75 MG tablet Take 1 tablet (75 mg total) by mouth daily. 30 tablet 1  . glyBURIDE (DIABETA) 2.5 MG tablet Take 1 tablet by mouth 2 (two) times daily.  3  . isosorbide mononitrate (IMDUR) 30 MG 24 hr tablet TAKE 1 TABLET BY MOUTH ONCE DAILY 30 tablet 6  . lisinopril (PRINIVIL,ZESTRIL) 40 MG tablet Take 1 tablet (40 mg total) by mouth daily.  30 tablet 3  . METFORMIN HCL PO Take 1 tablet by mouth daily.    . nitroGLYCERIN (NITROSTAT) 0.4 MG SL tablet Place 1 tablet (0.4 mg total) under the tongue every 5 (five) minutes as needed for chest pain (up to 3 doses).    Marland Kitchen NITROSTAT 0.4 MG SL tablet PLACE 1 TABLET UNDER TONGUE EVERY 5 MINUTES X3 DOSES IF NO BETTER CALL 911 25 tablet 0  . sertraline (ZOLOFT) 50 MG tablet Take 50 mg by mouth daily.    Marland Kitchen spironolactone (ALDACTONE) 25 MG tablet Take 1 tablet (25 mg total) by mouth daily. 60 tablet 6   No current facility-administered medications for this visit.     Past Medical History  Diagnosis Date  . Hypertension   . CAD (coronary artery disease)     a. 01/2010 - MI with unsuccessful PCI of RCA. b. s/p PCI/DES to mid RCA 05/2012. c. Cath 11/2012 for abnl stress test - diffuse branch and distal vessel CAD for med rx.    . Hyperlipidemia   . OSA on CPAP   . Paresthesias     a. bilat upper & lower extremities - intermittently present since 2011.  . Diabetes mellitus     a. A1c 6.9 in 11/2012.    Past Surgical History  Procedure Laterality Date  . Coronary angioplasty with stent placement  05/14/2012    "1"  .  Patellar tendon repair  1990's    left  . Percutaneous coronary stent intervention (pci-s) N/A 05/14/2012    Procedure: PERCUTANEOUS CORONARY STENT INTERVENTION (PCI-S);  Surgeon: Kathleene Hazel, MD;  Location: [facility] CATH LAB;  Service: Cardiovascular;  Laterality: N/A;  . Left heart catheterization with coronary angiogram N/A 11/25/2012    Procedure: LEFT HEART CATHETERIZATION WITH CORONARY ANGIOGRAM;  Surgeon: Laurey Morale, MD;  Location: [facility] CATH LAB;  Service: Cardiovascular;  Laterality: N/A;    Social History   Social History  . Marital Status: Married    Spouse Name: N/A  . Number of Children: 2  . Years of Education: N/A   Occupational History  .  Unemployed   Social History Main Topics  . Smoking status: Former Smoker -- 0.50 packs/day for 18 years     Types: Cigarettes    Quit date: 11/26/2003  . Smokeless tobacco: Never Used  . Alcohol Use: No  . Drug Use: No  . Sexual Activity: Yes   Other Topics Concern  . Not on file   Social History Narrative    ROS: no fevers or chills, productive cough, hemoptysis, dysphasia, odynophagia, melena, hematochezia, dysuria, hematuria, rash, seizure activity, orthopnea, PND, pedal edema, claudication. Remaining systems are negative.  Physical Exam: Well-developed well-nourished in no acute distress.  Skin is warm and dry.  HEENT is normal.  Neck is supple.  Chest is clear to auscultation with normal expansion.  Cardiovascular exam is regular rate and rhythm.  Abdominal exam nontender or distended. No masses palpated. Extremities show no edema. neuro grossly intact  ECG sinus rhythm at a rate of 64. Normal axis. Inferior lateral T-wave inversion. Prior inferior infarct. No change compared to 10/11/2014.

## 2015-05-06 ENCOUNTER — Encounter: Payer: Self-pay | Admitting: Cardiology

## 2015-05-06 ENCOUNTER — Ambulatory Visit (INDEPENDENT_AMBULATORY_CARE_PROVIDER_SITE_OTHER): Payer: Commercial Managed Care - HMO | Admitting: Cardiology

## 2015-05-06 DIAGNOSIS — I10 Essential (primary) hypertension: Secondary | ICD-10-CM | POA: Diagnosis not present

## 2015-05-06 DIAGNOSIS — I2510 Atherosclerotic heart disease of native coronary artery without angina pectoris: Secondary | ICD-10-CM | POA: Diagnosis not present

## 2015-05-06 MED ORDER — ISOSORBIDE MONONITRATE ER 60 MG PO TB24: ORAL_TABLET | ORAL | Status: DC

## 2015-05-06 NOTE — Assessment & Plan Note (Signed)
Blood pressure mildly elevated. Continue present medications. Increase imdur to 60 mg daily.

## 2015-05-06 NOTE — Assessment & Plan Note (Signed)
Continue aspirin. Discontinue Plavix. Continue statin. Previous nuclear study low risk. He has known disease but also chronic symptoms that have not improved with stenting previously. I consider the stress test to be low risk. We discussed catheterization previously but given that his symptoms have not changed we will plan medical therapy. We can proceed with catheterization in the future if his symptoms worsen.

## 2015-05-06 NOTE — Patient Instructions (Signed)
Your physician wants you to follow-up in: 6 MONTHS WITH DR Jens Som You will receive a reminder letter in the mail two months in advance. If you don't receive a letter, please call our office to schedule the follow-up appointment.   STOP PLKAVIX  INCREASE ISOSORBIDE TO 60 MG ONCE DAILY=2 OF THE 30 MG TABLETS ONCE DAILY

## 2015-05-06 NOTE — Assessment & Plan Note (Signed)
Continue statin. 

## 2015-10-26 NOTE — Progress Notes (Signed)
HPI: FU coronary artery disease. Patient suffered a myocardial infarction in May of 2011. Attempt at PCI of an occluded RCA was not successful and the patient was treated medically. Ejection fraction was 45-50%. Patient had an echocardiogram in May of 2011 that showed an ejection fraction of 55-60%. There was mild hypokinesis of the basal and mid inferior wall. The left atrium was mildly dilated. The right ventricle was mildly dilated. Patient continued to have symptoms and had cath 9/13 that revealed Normal LM, 40 mid and 60 distal LAD, 30 diagonal, 40 Lcx, 99 mid RCA; EF 55; patient had DES to RCA at that time. Patient again admitted with continuing CP in March of 2013; enzymes neg; myoview 3/14 with EF 61; anteroseptal ischemia; inferior MI vs diaphragm. Cath 3/14 - LM luminal irreg, LAD 50 prox, 50 and 60 mid, 70 distal, D1 with 80 distal, Ramus with 70 mid, OM1 with 50, 50 distal left circ; RCA with patent stent, 90 PDA; medical therapy recommended. Nuclear study 2/16 showed EF 53, mild distal anterior ischemia and small previous infarct. Since I last saw him, His symptoms are similar. He continues to describe fatigue, dyspnea on exertion. These are chronic complaints. No orthopnea, PND or pedal edema. He has occasional chest tightness with activities and at rest. His symptoms are unchanged compared to August and have been present since his previous infarct.  Current Outpatient Prescriptions  Medication Sig Dispense Refill  . amLODipine (NORVASC) 10 MG tablet TAKE 1 TABLET BY MOUTH EVERY DAY 30 tablet 6  . aspirin EC 81 MG tablet Take 81 mg by mouth daily.    Marland Kitchen atorvastatin (LIPITOR) 80 MG tablet Take 1 tablet (80 mg total) by mouth daily. 30 tablet 12  . BYSTOLIC 20 MG TABS TAKE 1 TABLET BY MOUTH EVERY DAY 30 tablet 3  . glyBURIDE (DIABETA) 2.5 MG tablet Take 1 tablet by mouth 2 (two) times daily.  3  . isosorbide mononitrate (IMDUR) 60 MG 24 hr tablet TAKE 1 TABLET BY MOUTH ONCE DAILY 90  tablet 3  . lisinopril (PRINIVIL,ZESTRIL) 40 MG tablet Take 1 tablet (40 mg total) by mouth daily. 30 tablet 3  . METFORMIN HCL PO Take 500 mg by mouth 2 (two) times daily.     . nitroGLYCERIN (NITROSTAT) 0.4 MG SL tablet Place 1 tablet (0.4 mg total) under the tongue every 5 (five) minutes as needed for chest pain (up to 3 doses).    Marland Kitchen spironolactone (ALDACTONE) 25 MG tablet Take 1 tablet (25 mg total) by mouth daily. 60 tablet 6   No current facility-administered medications for this visit.     Past Medical History  Diagnosis Date  . Hypertension   . CAD (coronary artery disease)     a. 01/2010 - MI with unsuccessful PCI of RCA. b. s/p PCI/DES to mid RCA 05/2012. c. Cath 11/2012 for abnl stress test - diffuse branch and distal vessel CAD for med rx.    . Hyperlipidemia   . OSA on CPAP   . Paresthesias     a. bilat upper & lower extremities - intermittently present since 2011.  . Diabetes mellitus (HCC)     a. A1c 6.9 in 11/2012.    Past Surgical History  Procedure Laterality Date  . Coronary angioplasty with stent placement  05/14/2012    "1"  . Patellar tendon repair  1990's    left  . Percutaneous coronary stent intervention (pci-s) N/A 05/14/2012    Procedure: PERCUTANEOUS  CORONARY STENT INTERVENTION (PCI-S);  Surgeon: Kathleene Hazel, MD;  Location: [facility] CATH LAB;  Service: Cardiovascular;  Laterality: N/A;  . Left heart catheterization with coronary angiogram N/A 11/25/2012    Procedure: LEFT HEART CATHETERIZATION WITH CORONARY ANGIOGRAM;  Surgeon: Laurey Morale, MD;  Location: [facility] CATH LAB;  Service: Cardiovascular;  Laterality: N/A;    Social History   Social History  . Marital Status: Married    Spouse Name: N/A  . Number of Children: 2  . Years of Education: N/A   Occupational History  .  Unemployed   Social History Main Topics  . Smoking status: Former Smoker -- 0.50 packs/day for 18 years    Types: Cigarettes    Quit date: 11/26/2003  . Smokeless tobacco:  Never Used  . Alcohol Use: No  . Drug Use: No  . Sexual Activity: Yes   Other Topics Concern  . Not on file   Social History Narrative    Family History  Problem Relation Age of Onset  . Hypertension Mother   . Cancer Mother   . Diabetes Mother   . Cancer Father   . Stroke Mother     ROS: Fatigue but no fevers or chills, productive cough, hemoptysis, dysphasia, odynophagia, melena, hematochezia, dysuria, hematuria, rash, seizure activity, orthopnea, PND, pedal edema, claudication. Remaining systems are negative.  Physical Exam: Well-developed well-nourished in no acute distress.  Skin is warm and dry.  HEENT is normal.  Neck is supple.  Chest is clear to auscultation with normal expansion.  Cardiovascular exam is regular rate and rhythm.  Abdominal exam nontender or distended. No masses palpated. Extremities show no edema. neuro grossly intact  ECG Sinus rhythm at a rate of 62. Inferior infarct.

## 2015-11-03 ENCOUNTER — Encounter: Payer: Self-pay | Admitting: Cardiology

## 2015-11-03 ENCOUNTER — Ambulatory Visit (INDEPENDENT_AMBULATORY_CARE_PROVIDER_SITE_OTHER): Payer: Commercial Managed Care - HMO | Admitting: Cardiology

## 2015-11-03 VITALS — BP 160/90 | HR 62 | Ht 67.0 in | Wt 209.7 lb

## 2015-11-03 DIAGNOSIS — I10 Essential (primary) hypertension: Secondary | ICD-10-CM | POA: Diagnosis not present

## 2015-11-03 DIAGNOSIS — I2510 Atherosclerotic heart disease of native coronary artery without angina pectoris: Secondary | ICD-10-CM | POA: Diagnosis not present

## 2015-11-03 DIAGNOSIS — I2583 Coronary atherosclerosis due to lipid rich plaque: Secondary | ICD-10-CM

## 2015-11-03 MED ORDER — ISOSORBIDE MONONITRATE ER 60 MG PO TB24: 90.0000 mg | ORAL_TABLET | Freq: Every day | ORAL | Status: DC

## 2015-11-03 NOTE — Assessment & Plan Note (Addendum)
Continue aspirin and statin. Previous nuclear study low risk. He has known disease but also chronic symptoms that have not improved with stenting previously. I consider previous stress test to be low risk. We discussed catheterization again today but given that his symptoms have not changed we will plan medical therapy. We can proceed with catheterization in the future if his symptoms worsen. Increase imdur to 90 mg daily.

## 2015-11-03 NOTE — Patient Instructions (Signed)
Medication Instructions:   INCREASE ISOSORBIDE TO 90 MG ONCE DAILY= 1 AND 1/2 OF THE 60 MG TABLET ONCE DAILY  Follow-Up:  Your physician wants you to follow-up in: 6 MONTHS WITH DR Jens Som You will receive a reminder letter in the mail two months in advance. If you don't receive a letter, please call our office to schedule the follow-up appointment.   If you need a refill on your cardiac medications before your next appointment, please call your pharmacy.

## 2015-11-03 NOTE — Assessment & Plan Note (Signed)
Continue statin. 

## 2015-11-03 NOTE — Assessment & Plan Note (Signed)
Blood pressure is mildly elevated today. However he has not taken his a.m. Medications. We will continue with present regimen but I will increase imdur to 90 mg daily. I have asked him to follow his blood pressure at home and we will add additional medications as needed. Note his potassium, renal function, lipids and liver are monitored by primary care.

## 2016-05-17 ENCOUNTER — Ambulatory Visit: Payer: Commercial Managed Care - HMO | Admitting: Cardiology

## 2016-06-11 NOTE — Progress Notes (Signed)
HPI: FU coronary artery disease. Patient suffered a myocardial infarction in May of 2011. Attempt at PCI of an occluded RCA was not successful and the patient was treated medically. Ejection fraction was 45-50%. Patient had an echocardiogram in May of 2011 that showed an ejection fraction of 55-60%. There was mild hypokinesis of the basal and mid inferior wall. The left atrium was mildly dilated. The right ventricle was mildly dilated. Patient continued to have symptoms and had cath 9/13 that revealed Normal LM, 40 mid and 60 distal LAD, 30 diagonal, 40 Lcx, 99 mid RCA; EF 55; patient had DES to RCA at that time. Patient again admitted with continuing CP in March of 2013; enzymes neg; myoview 3/14 with EF 61; anteroseptal ischemia; inferior MI vs diaphragm. Cath 3/14 - LM luminal irreg, LAD 50 prox, 50 and 60 mid, 70 distal, D1 with 80 distal, Ramus with 70 mid, OM1 with 50, 50 distal left circ; RCA with patent stent, 90 PDA; medical therapy recommended. Nuclear study 2/16 showed EF 53, mild distal anterior ischemia and small previous infarct. Since I last saw him, he notes dyspnea with exertion and some chest pain with exertion. His symptoms are unchanged compared to previous. He does not have symptoms at rest. No syncope. He also describes occasional palpitations described as heart racing leaving him fatigued.  Current Outpatient Prescriptions  Medication Sig Dispense Refill  . amLODipine (NORVASC) 10 MG tablet TAKE 1 TABLET BY MOUTH EVERY DAY 30 tablet 6  . aspirin EC 81 MG tablet Take 81 mg by mouth daily.    Marland Kitchen. atorvastatin (LIPITOR) 80 MG tablet Take 1 tablet (80 mg total) by mouth daily. 30 tablet 12  . BYSTOLIC 20 MG TABS TAKE 1 TABLET BY MOUTH EVERY DAY 30 tablet 3  . glyBURIDE (DIABETA) 2.5 MG tablet Take 1 tablet by mouth 2 (two) times daily.  3  . isosorbide mononitrate (IMDUR) 60 MG 24 hr tablet Take 1.5 tablets (90 mg total) by mouth daily. 135 tablet 3  . lisinopril  (PRINIVIL,ZESTRIL) 40 MG tablet Take 1 tablet (40 mg total) by mouth daily. 30 tablet 3  . METFORMIN HCL PO Take 500 mg by mouth 2 (two) times daily.     . nitroGLYCERIN (NITROSTAT) 0.4 MG SL tablet Place 1 tablet (0.4 mg total) under the tongue every 5 (five) minutes as needed for chest pain (up to 3 doses).    Marland Kitchen. spironolactone (ALDACTONE) 25 MG tablet Take 1 tablet (25 mg total) by mouth daily. 60 tablet 6   No current facility-administered medications for this visit.      Past Medical History:  Diagnosis Date  . CAD (coronary artery disease)    a. 01/2010 - MI with unsuccessful PCI of RCA. b. s/p PCI/DES to mid RCA 05/2012. c. Cath 11/2012 for abnl stress test - diffuse branch and distal vessel CAD for med rx.    . Diabetes mellitus (HCC)    a. A1c 6.9 in 11/2012.  Marland Kitchen. Hyperlipidemia   . Hypertension   . OSA on CPAP   . Paresthesias    a. bilat upper & lower extremities - intermittently present since 2011.    Past Surgical History:  Procedure Laterality Date  . CORONARY ANGIOPLASTY WITH STENT PLACEMENT  05/14/2012   "1"  . LEFT HEART CATHETERIZATION WITH CORONARY ANGIOGRAM N/A 11/25/2012   Procedure: LEFT HEART CATHETERIZATION WITH CORONARY ANGIOGRAM;  Surgeon: Laurey Moralealton S McLean, MD;  [facility];  Service: Cardiovascular;  Laterality: N/A;  . PATELLAR TENDON REPAIR  1990's   left  . PERCUTANEOUS CORONARY STENT INTERVENTION (PCI-S) N/A 05/14/2012   Procedure: PERCUTANEOUS CORONARY STENT INTERVENTION (PCI-S);  Surgeon: Kathleene Hazel, MD;  Location: [facility] CATH LAB;  Service: Cardiovascular;  Laterality: N/A;    Social History   Social History  . Marital status: Married    Spouse name: N/A  . Number of children: 2  . Years of education: N/A   Occupational History  .  Unemployed   Social History Main Topics  . Smoking status: Former Smoker    Packs/day: 0.50    Years: 18.00    Types: Cigarettes    Quit date: 11/26/2003  . Smokeless tobacco: Never Used  . Alcohol  use No  . Drug use: No  . Sexual activity: Yes   Other Topics Concern  . Not on file   Social History Narrative  . No narrative on file    Family History  Problem Relation Age of Onset  . Hypertension Mother   . Cancer Mother   . Diabetes Mother   . Stroke Mother   . Cancer Father     ROS: fatigue but no fevers or chills, productive cough, hemoptysis, dysphasia, odynophagia, melena, hematochezia, dysuria, hematuria, rash, seizure activity, orthopnea, PND, pedal edema, claudication. Remaining systems are negative.  Physical Exam: Well-developed well-nourished in no acute distress.  Skin is warm and dry.  HEENT is normal.  Neck is supple.  Chest is clear to auscultation with normal expansion.  Cardiovascular exam is regular rate and rhythm.  Abdominal exam nontender or distended. No masses palpated. Extremities show no edema. neuro grossly intact  ECG-sinus bradycardia at a rate of 58. Prior inferior infarct. Anterior infarct versus lead placement. Left ventricular hypertrophy.  A/P  1 coronary artery disease-continue aspirin and statin. Patient has chronic dyspnea and chest pain which is essentially unchanged. We will plan to continue with medical therapy.  2 hyperlipidemia-continue statin.   3 hypertension-blood pressure elevated but typically controlled at home; continue present meds and follow  4 palpitations-schedule event monitor.  Olga Millers, MD

## 2016-06-15 ENCOUNTER — Encounter: Payer: Self-pay | Admitting: Cardiology

## 2016-06-15 ENCOUNTER — Ambulatory Visit (INDEPENDENT_AMBULATORY_CARE_PROVIDER_SITE_OTHER): Payer: Commercial Managed Care - HMO | Admitting: Cardiology

## 2016-06-15 ENCOUNTER — Ambulatory Visit (INDEPENDENT_AMBULATORY_CARE_PROVIDER_SITE_OTHER): Payer: Commercial Managed Care - HMO

## 2016-06-15 VITALS — BP 158/92 | HR 58 | Ht 67.0 in | Wt 204.0 lb

## 2016-06-15 DIAGNOSIS — R002 Palpitations: Secondary | ICD-10-CM | POA: Diagnosis not present

## 2016-06-15 DIAGNOSIS — E7849 Other hyperlipidemia: Secondary | ICD-10-CM

## 2016-06-15 DIAGNOSIS — I10 Essential (primary) hypertension: Secondary | ICD-10-CM | POA: Diagnosis not present

## 2016-06-15 DIAGNOSIS — E784 Other hyperlipidemia: Secondary | ICD-10-CM | POA: Diagnosis not present

## 2016-06-15 DIAGNOSIS — I2510 Atherosclerotic heart disease of native coronary artery without angina pectoris: Secondary | ICD-10-CM

## 2016-06-15 NOTE — Patient Instructions (Signed)
Medication Instructions:   NO CHANGE  Testing/Procedures:  Your physician has recommended that you wear an event monitor. Event monitors are medical devices that record the heart's electrical activity. Doctors most often us these monitors to diagnose arrhythmias. Arrhythmias are problems with the speed or rhythm of the heartbeat. The monitor is a small, portable device. You can wear one while you do your normal daily activities. This is usually used to diagnose what is causing palpitations/syncope (passing out).    Follow-Up:  Your physician wants you to follow-up in: 6 MONTHS WITH DR CRENSHAW You will receive a reminder letter in the mail two months in advance. If you don't receive a letter, please call our office to schedule the follow-up appointment.   If you need a refill on your cardiac medications before your next appointment, please call your pharmacy.    

## 2016-07-14 DIAGNOSIS — R002 Palpitations: Secondary | ICD-10-CM | POA: Diagnosis not present

## 2016-11-21 ENCOUNTER — Other Ambulatory Visit: Payer: Self-pay | Admitting: Cardiology

## 2016-11-21 DIAGNOSIS — I10 Essential (primary) hypertension: Secondary | ICD-10-CM

## 2016-11-21 DIAGNOSIS — I2510 Atherosclerotic heart disease of native coronary artery without angina pectoris: Secondary | ICD-10-CM

## 2016-11-21 NOTE — Telephone Encounter (Signed)
Rx(s) sent to pharmacy electronically.  

## 2016-12-11 NOTE — Progress Notes (Signed)
HPI: FU coronary artery disease. Patient suffered a myocardial infarction in May of 2011. Attempt at PCI of an occluded RCA was not successful and the patient was treated medically. Ejection fraction was 45-50%. Patient had an echocardiogram in May of 2011 that showed an ejection fraction of 55-60%. There was mild hypokinesis of the basal and mid inferior wall. The left atrium was mildly dilated. The right ventricle was mildly dilated. Patient continued to have symptoms and had cath 9/13 that revealed Normal LM, 40 mid and 60 distal LAD, 30 diagonal, 40 Lcx, 99 mid RCA; EF 55; patient had DES to RCA at that time. Patient again admitted with continuing CP in March of 2013; enzymes neg; myoview 3/14 with EF 61; anteroseptal ischemia; inferior MI vs diaphragm. Cath 3/14 - LM luminal irreg, LAD 50 prox, 50 and 60 mid, 70 distal, D1 with 80 distal, Ramus with 70 mid, OM1 with 50, 50 distal left circ; RCA with patent stent, 90 PDA; medical therapy recommended. Nuclear study 2/16 showed EF 53, mild distal anterior ischemia and small previous infarct. Monitor 10/17 showed sinus to sinus tach. Since I last saw him, he has some dyspnea on exertion and occasional pain in his chest with climbing stairs both of which are unchanged and he states actually improved. He continues to have occasional palpitations. They were described as his heart racing. He did have symptoms with monitor in place.  Current Outpatient Prescriptions  Medication Sig Dispense Refill  . amLODipine (NORVASC) 10 MG tablet TAKE 1 TABLET BY MOUTH EVERY DAY 30 tablet 6  . aspirin EC 81 MG tablet Take 81 mg by mouth daily.    Marland Kitchen atorvastatin (LIPITOR) 80 MG tablet Take 1 tablet (80 mg total) by mouth daily. 30 tablet 12  . BYSTOLIC 20 MG TABS TAKE 1 TABLET BY MOUTH EVERY DAY 30 tablet 3  . glyBURIDE (DIABETA) 2.5 MG tablet Take 1 tablet by mouth 2 (two) times daily.  3  . isosorbide mononitrate (IMDUR) 60 MG 24 hr tablet Take 1.5 tablets (90 mg  total) by mouth daily. 135 tablet 2  . lisinopril (PRINIVIL,ZESTRIL) 40 MG tablet Take 1 tablet (40 mg total) by mouth daily. 30 tablet 3  . METFORMIN HCL PO Take 500 mg by mouth 2 (two) times daily.     . nitroGLYCERIN (NITROSTAT) 0.4 MG SL tablet Place 1 tablet (0.4 mg total) under the tongue every 5 (five) minutes as needed for chest pain (up to 3 doses).    Marland Kitchen spironolactone (ALDACTONE) 25 MG tablet Take 1 tablet (25 mg total) by mouth daily. 60 tablet 6   No current facility-administered medications for this visit.      Past Medical History:  Diagnosis Date  . CAD (coronary artery disease)    a. 01/2010 - MI with unsuccessful PCI of RCA. b. s/p PCI/DES to mid RCA 05/2012. c. Cath 11/2012 for abnl stress test - diffuse branch and distal vessel CAD for med rx.    . Diabetes mellitus (HCC)    a. A1c 6.9 in 11/2012.  Marland Kitchen Hyperlipidemia   . Hypertension   . OSA on CPAP   . Paresthesias    a. bilat upper & lower extremities - intermittently present since 2011.    Past Surgical History:  Procedure Laterality Date  . CORONARY ANGIOPLASTY WITH STENT PLACEMENT  05/14/2012   "1"  . LEFT HEART CATHETERIZATION WITH CORONARY ANGIOGRAM N/A 11/25/2012   Procedure: LEFT HEART CATHETERIZATION WITH CORONARY ANGIOGRAM;  Surgeon: Laurey Morale, MD;  Location[facility] CATH LAB;  Service: Cardiovascular;  Laterality: N/A;  . PATELLAR TENDON REPAIR  1990's   left  . PERCUTANEOUS CORONARY STENT INTERVENTION (PCI-S) N/A 05/14/2012   Procedure: PERCUTANEOUS CORONARY STENT INTERVENTION (PCI-S);  Surgeon: Kathleene Hazel, MD;  Location: [facility] CATH LAB;  Service: Cardiovascular;  Laterality: N/A;    Social History   Social History  . Marital status: Married    Spouse name: N/A  . Number of children: 2  . Years of education: N/A   Occupational History  .  Unemployed   Social History Main Topics  . Smoking status: Former Smoker    Packs/day: 0.50    Years: 18.00    Types: Cigarettes    Quit date:  11/26/2003  . Smokeless tobacco: Never Used  . Alcohol use No  . Drug use: No  . Sexual activity: Yes   Other Topics Concern  . Not on file   Social History Narrative  . No narrative on file    Family History  Problem Relation Age of Onset  . Hypertension Mother   . Cancer Mother   . Diabetes Mother   . Stroke Mother   . Cancer Father     ROS: no fevers or chills, productive cough, hemoptysis, dysphasia, odynophagia, melena, hematochezia, dysuria, hematuria, rash, seizure activity, orthopnea, PND, pedal edema, claudication. Remaining systems are negative.  Physical Exam: Well-developed well-nourished in no acute distress.  Skin is warm and dry.  HEENT is normal.  Neck is supple. No bruits Chest is clear to auscultation with normal expansion.  Cardiovascular exam is regular rate and rhythm.  Abdominal exam nontender or distended. No masses palpated. Extremities show no edema. neuro grossly intact  ECG- normal sinus rhythm at a rate of 68. Left ventricular hypertrophy. Inferior infarct. Cannot rule out anterior infarct. personally reviewed  A/P  1 coronary artery disease-continue aspirin and statin.  2 chest pain/dyspnea-symptoms are unchanged and chronic. He actually states there is some improvement. We will continue medical therapy for now.  3 hyperlipidemia-continue statin. Lipids and liver monitored by primary care.   4 hypertension-blood pressure elevated. However patient states he has not taken his medications this morning. I have asked him to purchase a blood pressure cuff and keep track of his readings. He will bring his cuff with next office visit and will correlate with ours. Advance regimen as needed.   5 palpitations-Patient continues to have palpitations. He did have a recent monitor and have palpitations with it in place. No significant arrhythmias noted. Continue beta blocker.   Olga Millers, MD

## 2016-12-18 ENCOUNTER — Ambulatory Visit (INDEPENDENT_AMBULATORY_CARE_PROVIDER_SITE_OTHER): Payer: Medicare Other | Admitting: Cardiology

## 2016-12-18 ENCOUNTER — Encounter: Payer: Self-pay | Admitting: Cardiology

## 2016-12-18 VITALS — BP 160/98 | HR 68 | Ht 67.0 in | Wt 207.0 lb

## 2016-12-18 DIAGNOSIS — I10 Essential (primary) hypertension: Secondary | ICD-10-CM

## 2016-12-18 DIAGNOSIS — E7800 Pure hypercholesterolemia, unspecified: Secondary | ICD-10-CM

## 2016-12-18 DIAGNOSIS — R002 Palpitations: Secondary | ICD-10-CM

## 2016-12-18 DIAGNOSIS — I2510 Atherosclerotic heart disease of native coronary artery without angina pectoris: Secondary | ICD-10-CM

## 2016-12-18 NOTE — Patient Instructions (Signed)
Your physician wants you to follow-up in: 6 MONTHS WITH DR CRENSHAW You will receive a reminder letter in the mail two months in advance. If you don't receive a letter, please call our office to schedule the follow-up appointment.   If you need a refill on your cardiac medications before your next appointment, please call your pharmacy.  

## 2017-06-17 NOTE — Progress Notes (Signed)
HPI: FU coronary artery disease. Patient suffered a myocardial infarction in May of 2011. Attempt at PCI of an occluded RCA was not successful and the patient was treated medically. Ejection fraction was 45-50%. Patient had an echocardiogram in May of 2011 that showed an ejection fraction of 55-60%. There was mild hypokinesis of the basal and mid inferior wall. The left atrium was mildly dilated. The right ventricle was mildly dilated. Patient continued to have symptoms and had cath 9/13 that revealed Normal LM, 40 mid and 60 distal LAD, 30 diagonal, 40 Lcx, 99 mid RCA; EF 55; patient had DES to RCA at that time. Patient again admitted with continuing CP in March of 2013; enzymes neg; myoview 3/14 with EF 61; anteroseptal ischemia; inferior MI vs diaphragm. Cath 3/14 - LM luminal irreg, LAD 50 prox, 50 and 60 mid, 70 distal, D1 with 80 distal, Ramus with 70 mid, OM1 with 50, 50 distal left circ; RCA with patent stent, 90 PDA; medical therapy recommended. Nuclear study 2/16 showed EF 53, mild distal anterior ischemia and small previous infarct. Monitor 10/17 showed sinus to sinus tach. Since I last saw him, he has some dyspnea on exertion and occasional chest pain which is unchanged in severity or frequency. No syncope.  Current Outpatient Prescriptions  Medication Sig Dispense Refill  . amLODipine (NORVASC) 10 MG tablet TAKE 1 TABLET BY MOUTH EVERY DAY 30 tablet 6  . aspirin EC 81 MG tablet Take 81 mg by mouth daily.    Marland Kitchen lisinopril (PRINIVIL,ZESTRIL) 40 MG tablet Take 1 tablet (40 mg total) by mouth daily. 30 tablet 3  . nitroGLYCERIN (NITROSTAT) 0.4 MG SL tablet Place 1 tablet (0.4 mg total) under the tongue every 5 (five) minutes as needed for chest pain (up to 3 doses).    . pioglitazone (ACTOS) 30 MG tablet Take 30 mg by mouth daily.  5  . Vitamin D, Ergocalciferol, (DRISDOL) 50000 units CAPS capsule Take 50,000 Units by mouth once a week.  5   No current facility-administered medications  for this visit.      Past Medical History:  Diagnosis Date  . CAD (coronary artery disease)    a. 01/2010 - MI with unsuccessful PCI of RCA. b. s/p PCI/DES to mid RCA 05/2012. c. Cath 11/2012 for abnl stress test - diffuse branch and distal vessel CAD for med rx.    . Diabetes mellitus (HCC)    a. A1c 6.9 in 11/2012.  Marland Kitchen Hyperlipidemia   . Hypertension   . OSA on CPAP   . Paresthesias    a. bilat upper & lower extremities - intermittently present since 2011.    Past Surgical History:  Procedure Laterality Date  . CORONARY ANGIOPLASTY WITH STENT PLACEMENT  05/14/2012   "1"  . LEFT HEART CATHETERIZATION WITH CORONARY ANGIOGRAM N/A 11/25/2012   Procedure: LEFT HEART CATHETERIZATION WITH CORONARY ANGIOGRAM;  Surgeon: Laurey Morale, MD;  Location: [facility] CATH LAB;  Service: Cardiovascular;  Laterality: N/A;  . PATELLAR TENDON REPAIR  1990's   left  . PERCUTANEOUS CORONARY STENT INTERVENTION (PCI-S) N/A 05/14/2012   Procedure: PERCUTANEOUS CORONARY STENT INTERVENTION (PCI-S);  Surgeon: Kathleene Hazel, MD;  Location: [facility] CATH LAB;  Service: Cardiovascular;  Laterality: N/A;    Social History   Social History  . Marital status: Married    Spouse name: N/A  . Number of children: 2  . Years of education: N/A   Occupational History  .  Unemployed   Social  History Main Topics  . Smoking status: Former Smoker    Packs/day: 0.50    Years: 18.00    Types: Cigarettes    Quit date: 11/26/2003  . Smokeless tobacco: Never Used  . Alcohol use No  . Drug use: No  . Sexual activity: Yes   Other Topics Concern  . Not on file   Social History Narrative  . No narrative on file    Family History  Problem Relation Age of Onset  . Hypertension Mother   . Cancer Mother   . Diabetes Mother   . Stroke Mother   . Cancer Father     ROS: no fevers or chills, productive cough, hemoptysis, dysphasia, odynophagia, melena, hematochezia, dysuria, hematuria, rash, seizure activity, orthopnea,  PND, pedal edema, claudication. Remaining systems are negative.  Physical Exam: Well-developed well-nourished in no acute distress.  Skin is warm and dry.  HEENT is normal.  Neck is supple.  Chest is clear to auscultation with normal expansion.  Cardiovascular exam is regular rate and rhythm.  Abdominal exam nontender or distended. No masses palpated. Extremities show no edema. neuro grossly intact  ECG- sinus rhythm at a rate of 66, left ventricular hypertrophy, prior inferior infarct. No change compared to 12/18/2016.  personally reviewed  A/P  1 coronary artery disease-plan to continue aspirin and resume statin.  2 chest pain/dyspnea-patient continues to have these symptoms which are chronic and unchanged. We will continue medical therapy at this point.  3 hypertension-blood pressure is elevated. However he is not taking all of his medications. Continue amlodipine. Continue lisinopril. Resume isosorbide 90 mg daily and bysystolic 10 mg daily. Follow blood pressure and advance regimen as needed.  4 hyperlipidemia-Resume lipitor 80 mg daily. Check lipids and liver in 4 weeks.  5 palpitations-resume beta blocker.  Olga Millers, MD

## 2017-06-28 ENCOUNTER — Ambulatory Visit (INDEPENDENT_AMBULATORY_CARE_PROVIDER_SITE_OTHER): Payer: Medicare Other | Admitting: Cardiology

## 2017-06-28 ENCOUNTER — Encounter: Payer: Self-pay | Admitting: Cardiology

## 2017-06-28 VITALS — BP 180/90 | HR 66 | Ht 67.0 in | Wt 212.6 lb

## 2017-06-28 DIAGNOSIS — E7800 Pure hypercholesterolemia, unspecified: Secondary | ICD-10-CM | POA: Diagnosis not present

## 2017-06-28 DIAGNOSIS — I10 Essential (primary) hypertension: Secondary | ICD-10-CM

## 2017-06-28 DIAGNOSIS — I2510 Atherosclerotic heart disease of native coronary artery without angina pectoris: Secondary | ICD-10-CM | POA: Diagnosis not present

## 2017-06-28 MED ORDER — ISOSORBIDE MONONITRATE ER 60 MG PO TB24
60.0000 mg | ORAL_TABLET | Freq: Every day | ORAL | 3 refills | Status: DC
Start: 2017-06-28 — End: 2017-06-28

## 2017-06-28 MED ORDER — ATORVASTATIN CALCIUM 80 MG PO TABS: 80.0000 mg | ORAL_TABLET | Freq: Every day | ORAL | 3 refills | Status: DC

## 2017-06-28 MED ORDER — ISOSORBIDE MONONITRATE ER 60 MG PO TB24: 90.0000 mg | ORAL_TABLET | Freq: Every day | ORAL | 3 refills | Status: DC

## 2017-06-28 MED ORDER — NEBIVOLOL HCL 10 MG PO TABS: 10.0000 mg | ORAL_TABLET | Freq: Every day | ORAL | 3 refills | Status: DC

## 2017-06-28 NOTE — Patient Instructions (Signed)
Medication Instructions:   START ISOSORBIDE 90 MG ONCE DAILY= 1 AND 1/2 OF THE 60 MG TABLET ONCE DAILY  START  NEBIVOLOL 10 MG ONCE DAILY  START ATORVASTATIN 80 MG ONCE DAILY  Labwork:  Your physician recommends that you return for lab work in: 4 WEEKS PRIOR TO EATING  Follow-Up:  Your physician wants you to follow-up in: 6 MONTHS WITH DR Jens SomRENSHAW You will receive a reminder letter in the mail two months in advance. If you don't receive a letter, please call our office to schedule the follow-up appointment.   If you need a refill on your cardiac medications before your next appointment, please call your pharmacy.   TRACK BLOOD PRESSURE AND CALL IF CONSISTENTLY ABOVE 135/85

## 2017-07-27 LAB — BASIC METABOLIC PANEL
BUN / CREAT RATIO: 12 (ref 9–20)
BUN: 14 mg/dL (ref 6–24)
CO2: 23 mmol/L (ref 20–29)
Calcium: 9.2 mg/dL (ref 8.7–10.2)
Chloride: 103 mmol/L (ref 96–106)
Creatinine, Ser: 1.21 mg/dL (ref 0.76–1.27)
GFR, EST AFRICAN AMERICAN: 81 mL/min/{1.73_m2} (ref >59)
GFR, EST NON AFRICAN AMERICAN: 70 mL/min/{1.73_m2} (ref >59)
Glucose: 158 mg/dL — ABNORMAL HIGH (ref 65–99)
Potassium: 4.9 mmol/L (ref 3.5–5.2)
Sodium: 142 mmol/L (ref 134–144)

## 2017-07-27 LAB — HEPATIC FUNCTION PANEL
ALBUMIN: 4.2 g/dL (ref 3.5–5.5)
ALT: 24 IU/L (ref 0–44)
AST: 20 IU/L (ref 0–40)
Alkaline Phosphatase: 77 IU/L (ref 39–117)
BILIRUBIN TOTAL: 0.2 mg/dL (ref 0.0–1.2)
Bilirubin, Direct: 0.04 mg/dL (ref 0.00–0.40)
Total Protein: 7.2 g/dL (ref 6.0–8.5)

## 2017-07-27 LAB — LIPID PANEL
Chol/HDL Ratio: 5.1 ratio — ABNORMAL HIGH (ref 0.0–5.0)
Cholesterol, Total: 159 mg/dL (ref 100–199)
HDL: 31 mg/dL — ABNORMAL LOW (ref >39)
LDL Calculated: 101 mg/dL — ABNORMAL HIGH (ref 0–99)
TRIGLYCERIDES: 134 mg/dL (ref 0–149)
VLDL Cholesterol Cal: 27 mg/dL (ref 5–40)

## 2017-07-31 ENCOUNTER — Telehealth: Payer: Self-pay | Admitting: *Deleted

## 2017-07-31 DIAGNOSIS — E7800 Pure hypercholesterolemia, unspecified: Secondary | ICD-10-CM

## 2017-07-31 MED ORDER — EZETIMIBE 10 MG PO TABS
10.0000 mg | ORAL_TABLET | Freq: Every day | ORAL | 6 refills | Status: DC
Start: 2017-07-31 — End: 2018-03-16

## 2017-07-31 NOTE — Telephone Encounter (Signed)
Spoke with pt, aware of results. New script sent to the pharmacy and Lab orders mailed to the pt  

## 2017-07-31 NOTE — Telephone Encounter (Signed)
F/u message ° °Pt returning call °

## 2017-07-31 NOTE — Telephone Encounter (Addendum)
-----   Message from Lewayne BuntingBrian S Crenshaw, MD sent at 07/27/2017  9:47 AM EST ----- Add zetia 10 mg daily; lipids and liver 4 weeks Olga MillersBrian Crenshaw   Left message for pt to call

## 2017-09-05 ENCOUNTER — Encounter: Payer: Self-pay | Admitting: *Deleted

## 2017-09-13 LAB — HEPATIC FUNCTION PANEL
ALT: 32 IU/L (ref 0–44)
AST: 22 IU/L (ref 0–40)
Albumin: 4.3 g/dL (ref 3.5–5.5)
Alkaline Phosphatase: 77 IU/L (ref 39–117)
BILIRUBIN, DIRECT: 0.08 mg/dL (ref 0.00–0.40)
Bilirubin Total: 0.3 mg/dL (ref 0.0–1.2)
TOTAL PROTEIN: 7.3 g/dL (ref 6.0–8.5)

## 2017-09-13 LAB — LIPID PANEL
CHOL/HDL RATIO: 4.5 ratio (ref 0.0–5.0)
Cholesterol, Total: 148 mg/dL (ref 100–199)
HDL: 33 mg/dL — ABNORMAL LOW (ref >39)
LDL Calculated: 93 mg/dL (ref 0–99)
Triglycerides: 109 mg/dL (ref 0–149)
VLDL Cholesterol Cal: 22 mg/dL (ref 5–40)

## 2017-09-16 ENCOUNTER — Telehealth: Payer: Self-pay | Admitting: Cardiology

## 2017-09-16 NOTE — Telephone Encounter (Signed)
New Message  Patient is returning call that he received on 1/4 in reference to his lab results. Please call.

## 2017-09-16 NOTE — Telephone Encounter (Signed)
Notes recorded by Lewayne Buntingrenshaw, Brian S, MD on 09/15/2017 at 9:33 AM EST Add zetia 10 mg daily Lipids and liver 4 weeks Olga MillersBrian Crenshaw  He had lab work in November 2018 and zetia was added at that time.   Will defer to MD for further recommendations since he is currently on this medication

## 2017-09-16 NOTE — Telephone Encounter (Signed)
Patient called. He is agreeable to lipid clinic appointment. Scheduled to see Dr. Rennis GoldenHilty Jan 15 @ 10am.

## 2017-09-16 NOTE — Telephone Encounter (Signed)
Lipid clinic referral John MillersBrian Sheana May

## 2017-09-24 ENCOUNTER — Ambulatory Visit (INDEPENDENT_AMBULATORY_CARE_PROVIDER_SITE_OTHER): Payer: Medicare Other | Admitting: Internal Medicine

## 2017-09-24 ENCOUNTER — Encounter: Payer: Self-pay | Admitting: Internal Medicine

## 2017-09-24 VITALS — BP 158/88 | HR 72 | Ht 67.0 in | Wt 213.4 lb

## 2017-09-24 DIAGNOSIS — I2510 Atherosclerotic heart disease of native coronary artery without angina pectoris: Secondary | ICD-10-CM | POA: Diagnosis not present

## 2017-09-24 DIAGNOSIS — E782 Mixed hyperlipidemia: Secondary | ICD-10-CM

## 2017-09-24 DIAGNOSIS — I10 Essential (primary) hypertension: Secondary | ICD-10-CM | POA: Diagnosis not present

## 2017-09-24 NOTE — Progress Notes (Signed)
OFFICE NOTE  Chief Complaint:  Management of dyslipidemia  Primary Care Physician: Pa, [facility]  HPI:  John May is a 52 y.o. male patient of Dr. Jens Som with a past medial history significant for coronary artery disease with MI in 2011 status post PCI to the RCA, subsequently drug-eluting stent placement to the mid RCA 2013.  Unfortunately he has become disabled and is no longer working.  Previously had worked in Air traffic controller for the city for about 20 years.  After this he says his wife left him and he has a disabled child for which she cares for so he is under a lot of stress.  This is important because his diet is somewhat awful.  He says he is not really learn to cook for himself and is just starting to understand what healthy cooking is.  He generally eats fish in his diet but this may consist of fried fish a couple times a week.  He does not eat a lot of beef but does seem to get some saturated fats in his diet.  I recently his lipid profile showed total cholesterol 148, triglycerides 109, HDL 33 and LDL 93.  This is a typical profile of diabetic patient with low HDL, elevated triglycerides in the past and high LDL.  He is currently on atorvastatin 80 mg and ezetimibe and was referred to lipid clinic for further management since his goal LDL is at least less than 70.  PMHx:  Past Medical History:  Diagnosis Date  . CAD (coronary artery disease)    a. 01/2010 - MI with unsuccessful PCI of RCA. b. s/p PCI/DES to mid RCA 05/2012. c. Cath 11/2012 for abnl stress test - diffuse branch and distal vessel CAD for med rx.    . Diabetes mellitus (HCC)    a. A1c 6.9 in 11/2012.  Marland Kitchen Hyperlipidemia   . Hypertension   . OSA on CPAP   . Paresthesias    a. bilat upper & lower extremities - intermittently present since 2011.    Past Surgical History:  Procedure Laterality Date  . CORONARY ANGIOPLASTY WITH STENT PLACEMENT  05/14/2012   "1"  . LEFT HEART CATHETERIZATION WITH CORONARY ANGIOGRAM N/A  11/25/2012   Procedure: LEFT HEART CATHETERIZATION WITH CORONARY ANGIOGRAM;  Surgeon: Laurey Morale, MD;  Location: [facility] CATH LAB;  Service: Cardiovascular;  Laterality: N/A;  . PATELLAR TENDON REPAIR  1990's   left  . PERCUTANEOUS CORONARY STENT INTERVENTION (PCI-S) N/A 05/14/2012   Procedure: PERCUTANEOUS CORONARY STENT INTERVENTION (PCI-S);  Surgeon: Kathleene Hazel, MD;  Location: [facility] CATH LAB;  Service: Cardiovascular;  Laterality: N/A;    FAMHx:  Family History  Problem Relation Age of Onset  . Hypertension Mother   . Cancer Mother   . Diabetes Mother   . Stroke Mother   . Cancer Father     SOCHx:   reports that he quit smoking about 13 years ago. His smoking use included cigarettes. He has a 9.00 pack-year smoking history. he has never used smokeless tobacco. He reports that he does not drink alcohol or use drugs.  ALLERGIES:  Allergies  Allergen Reactions  . Bee Venom Swelling  . Food Allergy Formula Swelling    "Crab in stuffing; I eat shrimp and I don't have problems"  . Crab [Shellfish Allergy]   . Nutritional Supplements Swelling    unknown    ROS: Pertinent items noted in HPI and remainder of comprehensive ROS otherwise negative.  HOME MEDS: Current  Outpatient Medications on File Prior to Visit  Medication Sig Dispense Refill  . amLODipine (NORVASC) 10 MG tablet TAKE 1 TABLET BY MOUTH EVERY DAY 30 tablet 6  . aspirin EC 81 MG tablet Take 81 mg by mouth daily.    Marland Kitchen atorvastatin (LIPITOR) 80 MG tablet Take 1 tablet (80 mg total) by mouth daily. 90 tablet 3  . ezetimibe (ZETIA) 10 MG tablet Take 1 tablet (10 mg total) by mouth daily. 30 tablet 6  . isosorbide mononitrate (IMDUR) 60 MG 24 hr tablet Take 1.5 tablets (90 mg total) by mouth daily. 135 tablet 3  . lisinopril (PRINIVIL,ZESTRIL) 40 MG tablet Take 1 tablet (40 mg total) by mouth daily. 30 tablet 3  . nebivolol (BYSTOLIC) 10 MG tablet Take 1 tablet (10 mg total) by mouth daily. 90 tablet 3  .  nitroGLYCERIN (NITROSTAT) 0.4 MG SL tablet Place 1 tablet (0.4 mg total) under the tongue every 5 (five) minutes as needed for chest pain (up to 3 doses).    . pioglitazone (ACTOS) 30 MG tablet Take 30 mg by mouth daily.  5  . Vitamin D, Ergocalciferol, (DRISDOL) 50000 units CAPS capsule Take 50,000 Units by mouth once a week.  5   No current facility-administered medications on file prior to visit.     LABS/IMAGING: No results found for this or any previous visit (from the past 48 hour(s)). No results found.  LIPID PANEL:    Component Value Date/Time   CHOL 148 09/13/2017 0946   TRIG 109 09/13/2017 0946   HDL 33 (L) 09/13/2017 0946   CHOLHDL 4.5 09/13/2017 0946   CHOLHDL 4 10/06/2013 0809   VLDL 36.6 10/06/2013 0809   LDLCALC 93 09/13/2017 0946     WEIGHTS: Wt Readings from Last 3 Encounters:  09/24/17 213 lb 6.4 oz (96.8 kg)  06/28/17 212 lb 9.6 oz (96.4 kg)  12/18/16 207 lb (93.9 kg)    VITALS: BP (!) 158/88   Pulse 72   Ht 5\' 7"  (1.702 m)   Wt 213 lb 6.4 oz (96.8 kg)   BMI 33.42 kg/m   EXAM: General appearance: alert and no distress Neck: no carotid bruit, no JVD and thyroid not enlarged, symmetric, no tenderness/mass/nodules Lungs: clear to auscultation bilaterally Heart: regular rate and rhythm, S1, S2 normal, no murmur, click, rub or gallop Abdomen: soft, non-tender; bowel sounds normal; no masses,  no organomegaly Extremities: extremities normal, atraumatic, no cyanosis or edema Pulses: 2+ and symmetric Skin: Skin color, texture, turgor normal. No rashes or lesions Neurologic: Grossly normal Psych: Pleasant  EKG: Deferred  ASSESSMENT: 1. Mixed dyslipidemia (high LDL, triglycerides and low HDL) 2. ASCVD with prior coronary artery intervention 3. Hypertension 4. Diabetes type 2  PLAN: 1.   Mr. Astorga has a mixed dyslipidemia which is primarily a diabetic type.  He has had a marked improvement in his lipid profile with an LDL that has decreased to  around 93.  His triglycerides were very elevated and have improved as well.  Unfortunately he is still not at goal LDL less than 70.  I think he can reach his however with dietary changes.  He has been under a lot of stress and says that he will make significant changes to his diet.  We talked about diet options today that may be best for his cholesterol and I provided him with some literature on that.  We will plan on another 6 months of aggressive dietary intervention and increased exercise which she is committed  to doing.  We will reassess his lipid profile at that time and if he is not at goal we could consider adding WelChol or possibly a PCSK9.  The WelChol would be more affordable and actually help with his diabetes.  With regards to diabetes, we also discussed the SGL T-2 inhibitor Jardiance.  This medication could be costly for him at this time however data suggest that he would derive mortality benefit on it and I would consider starting it.  TIME SPENT WITH PATIENT: 25 minutes of direct patient care. More than 50% of that time was spent on coordination of care and counseling regarding nutrition, dyslipidemia management and physical activity recommendations.  Follow-up 6 months.  Chrystie NoseKenneth C. Hilty, MD, [facility] D[facility] Orthopaedic Surgery CenterFACC, FACP  [place]  [facility]  Medical Director of the [facility]  [facility] Diplomate of the American Board of Clinical Lipidology Attending Cardiologist  Direct Dial: [phone]  Fax: [phone]  Website:  [email or web address]  Kenneth C Hilty 09/24/2017, 10:03 AM

## 2017-09-24 NOTE — Patient Instructions (Signed)
Your physician wants you to follow-up in: 6 months with Dr. Rennis GoldenHilty. You will receive a reminder letter in the mail two months in advance. If you don't receive a letter, please call our office to schedule the follow-up appointment. Please have fasting blood work about 1 week prior to your next visit with Dr. Rennis GoldenHilty.

## 2017-09-25 ENCOUNTER — Encounter: Payer: Self-pay | Admitting: Internal Medicine

## 2017-12-31 ENCOUNTER — Other Ambulatory Visit: Payer: Self-pay | Admitting: *Deleted

## 2017-12-31 ENCOUNTER — Other Ambulatory Visit: Payer: Self-pay | Admitting: Cardiology

## 2017-12-31 DIAGNOSIS — I2510 Atherosclerotic heart disease of native coronary artery without angina pectoris: Secondary | ICD-10-CM

## 2017-12-31 MED ORDER — NITROGLYCERIN 0.4 MG SL SUBL: 0.4000 mg | SUBLINGUAL_TABLET | SUBLINGUAL | 4 refills | Status: DC | PRN

## 2017-12-31 MED ORDER — AMLODIPINE BESYLATE 10 MG PO TABS: ORAL_TABLET | ORAL | 3 refills | Status: DC

## 2017-12-31 MED ORDER — ATORVASTATIN CALCIUM 80 MG PO TABS: 80.0000 mg | ORAL_TABLET | Freq: Every day | ORAL | 3 refills | Status: DC

## 2018-02-24 ENCOUNTER — Encounter: Payer: Self-pay | Admitting: Cardiology

## 2018-02-24 ENCOUNTER — Ambulatory Visit (INDEPENDENT_AMBULATORY_CARE_PROVIDER_SITE_OTHER): Payer: Medicare Other | Admitting: Cardiology

## 2018-02-24 VITALS — BP 148/90 | HR 61 | Ht 67.0 in | Wt 206.0 lb

## 2018-02-24 DIAGNOSIS — I2510 Atherosclerotic heart disease of native coronary artery without angina pectoris: Secondary | ICD-10-CM | POA: Diagnosis not present

## 2018-02-24 DIAGNOSIS — R002 Palpitations: Secondary | ICD-10-CM | POA: Diagnosis not present

## 2018-02-24 DIAGNOSIS — E7800 Pure hypercholesterolemia, unspecified: Secondary | ICD-10-CM | POA: Diagnosis not present

## 2018-02-24 DIAGNOSIS — I10 Essential (primary) hypertension: Secondary | ICD-10-CM

## 2018-02-24 LAB — LIPID PANEL
CHOLESTEROL TOTAL: 126 mg/dL (ref 100–199)
Chol/HDL Ratio: 4.3 ratio (ref 0.0–5.0)
HDL: 29 mg/dL — AB (ref >39)
LDL CALC: 67 mg/dL (ref 0–99)
TRIGLYCERIDES: 149 mg/dL (ref 0–149)
VLDL Cholesterol Cal: 30 mg/dL (ref 5–40)

## 2018-02-24 LAB — HEPATIC FUNCTION PANEL
ALT: 19 IU/L (ref 0–44)
AST: 15 IU/L (ref 0–40)
Albumin: 4 g/dL (ref 3.5–5.5)
Alkaline Phosphatase: 57 IU/L (ref 39–117)
BILIRUBIN, DIRECT: 0.07 mg/dL (ref 0.00–0.40)
Bilirubin Total: <0.2 mg/dL (ref 0.0–1.2)
Total Protein: 6.6 g/dL (ref 6.0–8.5)

## 2018-02-24 MED ORDER — NEBIVOLOL HCL 10 MG PO TABS: 15.0000 mg | ORAL_TABLET | Freq: Every day | ORAL | 3 refills | Status: DC

## 2018-02-24 NOTE — Patient Instructions (Signed)
Medication Instructions:   INCREASE NEBIVOLOL TO 15 MG ONCE DAILY= 1 AND 1/2 OF THE 10 MG TABLET ONCE DAILY  Labwork:  Your physician recommends that you HAVE LAB WORK TODAY  Follow-Up:  Your physician wants you to follow-up in: 6 MONTHS WITH DR Jens SomRENSHAW You will receive a reminder letter in the mail two months in advance. If you don't receive a letter, please call our office to schedule the follow-up appointment.   If you need a refill on your cardiac medications before your next appointment, please call your pharmacy.

## 2018-02-24 NOTE — Progress Notes (Signed)
HPI: FU coronary artery disease. Patient suffered a myocardial infarction in May of 2011. Attempt at PCI of an occluded RCA was not successful and the patient was treated medically. Ejection fraction was 45-50%. Patient had an echocardiogram in May of 2011 that showed an ejection fraction of 55-60%. There was mild hypokinesis of the basal and mid inferior wall. The left atrium was mildly dilated. The right ventricle was mildly dilated. Patient continued to have symptoms and had cath 9/13 that revealed Normal LM, 40 mid and 60 distal LAD, 30 diagonal, 40 Lcx, 99 mid RCA; EF 55; patient had DES to RCA at that time. Patient again admitted with continuing CP in March of 2013; enzymes neg; myoview 3/14 with EF 61; anteroseptal ischemia; inferior MI vs diaphragm. Cath 3/14 - LM luminal irreg, LAD 50 prox, 50 and 60 mid, 70 distal, D1 with 80 distal, Ramus with 70 mid, OM1 with 50, 50 distal left circ; RCA with patent stent, 90 PDA; medical therapy recommended. Nuclear study 2/16 showed EF 53, mild distal anterior ischemia and small previous infarct. Monitor 10/17 showed sinus to sinus tach.Since I last saw him,  he continues to have some dyspnea on exertion and chest pain with vigorous activities relieved with rest.  Symptoms are unchanged.  No syncope.  Current Outpatient Medications  Medication Sig Dispense Refill  . amLODipine (NORVASC) 10 MG tablet TAKE 1 TABLET BY MOUTH EVERY DAY 90 tablet 3  . aspirin EC 81 MG tablet Take 81 mg by mouth daily.    Marland Kitchen atorvastatin (LIPITOR) 80 MG tablet Take 1 tablet (80 mg total) by mouth daily. 90 tablet 3  . lisinopril (PRINIVIL,ZESTRIL) 40 MG tablet TAKE 1 TABLET BY MOUTH EVERY DAY 30 tablet 11  . nebivolol (BYSTOLIC) 10 MG tablet Take 1 tablet (10 mg total) by mouth daily. 90 tablet 3  . nitroGLYCERIN (NITROSTAT) 0.4 MG SL tablet Place 1 tablet (0.4 mg total) under the tongue every 5 (five) minutes as needed for chest pain (up to 3 doses). 75 tablet 4  .  pioglitazone (ACTOS) 30 MG tablet Take 30 mg by mouth daily.  5  . Vitamin D, Ergocalciferol, (DRISDOL) 50000 units CAPS capsule Take 50,000 Units by mouth once a week.  5  . ezetimibe (ZETIA) 10 MG tablet Take 1 tablet (10 mg total) by mouth daily. 30 tablet 6  . isosorbide mononitrate (IMDUR) 60 MG 24 hr tablet Take 1.5 tablets (90 mg total) by mouth daily. 135 tablet 3   No current facility-administered medications for this visit.      Past Medical History:  Diagnosis Date  . CAD (coronary artery disease)    a. 01/2010 - MI with unsuccessful PCI of RCA. b. s/p PCI/DES to mid RCA 05/2012. c. Cath 11/2012 for abnl stress test - diffuse branch and distal vessel CAD for med rx.    . Diabetes mellitus (HCC)    a. A1c 6.9 in 11/2012.  Marland Kitchen Hyperlipidemia   . Hypertension   . OSA on CPAP   . Paresthesias    a. bilat upper & lower extremities - intermittently present since 2011.    Past Surgical History:  Procedure Laterality Date  . CORONARY ANGIOPLASTY WITH STENT PLACEMENT  05/14/2012   "1"  . LEFT HEART CATHETERIZATION WITH CORONARY ANGIOGRAM N/A 11/25/2012   Procedure: LEFT HEART CATHETERIZATION WITH CORONARY ANGIOGRAM;  Surgeon: Laurey Morale, MD;  Location: [facility] CATH LAB;  Service: Cardiovascular;  Laterality: N/A;  . PATELLAR TENDON  REPAIR  1990's   left  . PERCUTANEOUS CORONARY STENT INTERVENTION (PCI-S) N/A 05/14/2012   Procedure: PERCUTANEOUS CORONARY STENT INTERVENTION (PCI-S);  Surgeon: Kathleene Hazelhristopher D McAlhany, MD;  [facility];  Service: Cardiovascular;  Laterality: N/A;    Social History   Socioeconomic History  . Marital status: Married    Spouse name: Not on file  . Number of children: 2  . Years of education: Not on file  . Highest education level: Not on file  Occupational History    Employer: UNEMPLOYED  Social Needs  . Financial resource strain: Not on file  . Food insecurity:    Worry: Not on file    Inability: Not on file  . Transportation needs:     Medical: Not on file    Non-medical: Not on file  Tobacco Use  . Smoking status: Former Smoker    Packs/day: 0.50    Years: 18.00    Pack years: 9.00    Types: Cigarettes    Last attempt to quit: 11/26/2003    Years since quitting: 14.2  . Smokeless tobacco: Never Used  Substance and Sexual Activity  . Alcohol use: No    Alcohol/week: 0.0 oz  . Drug use: No  . Sexual activity: Yes  Lifestyle  . Physical activity:    Days per week: Not on file    Minutes per session: Not on file  . Stress: Not on file  Relationships  . Social connections:    Talks on phone: Not on file    Gets together: Not on file    Attends religious service: Not on file    Active member of club or organization: Not on file    Attends meetings of clubs or organizations: Not on file    Relationship status: Not on file  . Intimate partner violence:    Fear of current or ex partner: Not on file    Emotionally abused: Not on file    Physically abused: Not on file    Forced sexual activity: Not on file  Other Topics Concern  . Not on file  Social History Narrative  . Not on file    Family History  Problem Relation Age of Onset  . Hypertension Mother   . Cancer Mother   . Diabetes Mother   . Stroke Mother   . Cancer Father     ROS: no fevers or chills, productive cough, hemoptysis, dysphasia, odynophagia, melena, hematochezia, dysuria, hematuria, rash, seizure activity, orthopnea, PND, pedal edema, claudication. Remaining systems are negative.  Physical Exam: Well-developed well-nourished in no acute distress.  Skin is warm and dry.  HEENT is normal.  Neck is supple.  Chest is clear to auscultation with normal expansion.  Cardiovascular exam is regular rate and rhythm.  Abdominal exam nontender or distended. No masses palpated. Extremities show no edema. neuro grossly intact  ECG-normal sinus rhythm at a rate of 61.  Inferior infarct.  Personally reviewed  A/P  1 coronary artery  disease-patient's symptoms are unchanged.  We will plan to continue medical therapy including aspirin and statin.  2 chest pain-symptoms unchanged compared to previous.  Continue medical therapy at this point.  3 hypertension-blood pressure is elevated.  Continue present medications but increase nebivolol to 15 mg daily and follow.  4 hyperlipidemia-continue statin.  Check lipids and liver.  5 palpitations-continue beta-blocker.  Symptoms are reasonably well controlled.  Olga MillersBrian Bron Snellings, MD

## 2018-02-25 ENCOUNTER — Encounter: Payer: Self-pay | Admitting: *Deleted

## 2018-03-16 ENCOUNTER — Other Ambulatory Visit: Payer: Self-pay | Admitting: Cardiology

## 2018-03-16 DIAGNOSIS — E7800 Pure hypercholesterolemia, unspecified: Secondary | ICD-10-CM

## 2018-03-17 NOTE — Telephone Encounter (Signed)
Rx sent to pharmacy   

## 2018-04-07 ENCOUNTER — Ambulatory Visit (INDEPENDENT_AMBULATORY_CARE_PROVIDER_SITE_OTHER): Payer: Medicare Other | Admitting: Internal Medicine

## 2018-04-07 ENCOUNTER — Encounter: Payer: Self-pay | Admitting: Internal Medicine

## 2018-04-07 VITALS — BP 150/90 | HR 69 | Ht 67.0 in | Wt 204.0 lb

## 2018-04-07 DIAGNOSIS — E782 Mixed hyperlipidemia: Secondary | ICD-10-CM

## 2018-04-07 DIAGNOSIS — I2510 Atherosclerotic heart disease of native coronary artery without angina pectoris: Secondary | ICD-10-CM

## 2018-04-07 NOTE — Patient Instructions (Signed)
Your physician recommends that you schedule a follow-up appointment as needed with Dr. Hilty.  

## 2018-04-07 NOTE — Progress Notes (Signed)
OFFICE NOTE  Chief Complaint:  Follow-up lipid profile  Primary Care Physician: Pa, [facility]  HPI:  John May is a 52 y.o. male patient of Dr. Jens Som with a past medial history significant for coronary artery disease with MI in 2011 status post PCI to the RCA, subsequently drug-eluting stent placement to the mid RCA 2013.  Unfortunately he has become disabled and is no longer working.  Previously had worked in Air traffic controller for the city for about 20 years.  After this he says his wife left him and he has a disabled child for which she cares for so he is under a lot of stress.  This is important because his diet is somewhat awful.  He says he is not really learn to cook for himself and is just starting to understand what healthy cooking is.  He generally eats fish in his diet but this may consist of fried fish a couple times a week.  He does not eat a lot of beef but does seem to get some saturated fats in his diet.  I recently his lipid profile showed total cholesterol 148, triglycerides 109, HDL 33 and LDL 93.  This is a typical profile of diabetic patient with low HDL, elevated triglycerides in the past and high LDL.  He is currently on atorvastatin 80 mg and ezetimibe and was referred to lipid clinic for further management since his goal LDL is at least less than 70.  04/07/2018  John May returns today for follow-up of his dyslipidemia.  He has had a nice improvement in his lipid profile after aggressive dietary changes.  Total cholesterol now 126, triglycerides 149, and HDL 29 and LDL 67.  He is now at target.  He does remain on high intensity atorvastatin 80 mg and ezetimibe 10 mg daily.  In addition he has been started on Jardiance which is guideline directed therapy to reduce his coronary artery risk.  He says he has had an A1c benefit from that as well with improvement in blood sugars.  He was counseled to remain hydrated and watch for any increased risk of urinary tract  infections.  PMHx:  Past Medical History:  Diagnosis Date  . CAD (coronary artery disease)    a. 01/2010 - MI with unsuccessful PCI of RCA. b. s/p PCI/DES to mid RCA 05/2012. c. Cath 11/2012 for abnl stress test - diffuse branch and distal vessel CAD for med rx.    . Diabetes mellitus (HCC)    a. A1c 6.9 in 11/2012.  Marland Kitchen Hyperlipidemia   . Hypertension   . OSA on CPAP   . Paresthesias    a. bilat upper & lower extremities - intermittently present since 2011.    Past Surgical History:  Procedure Laterality Date  . CORONARY ANGIOPLASTY WITH STENT PLACEMENT  05/14/2012   "1"  . LEFT HEART CATHETERIZATION WITH CORONARY ANGIOGRAM N/A 11/25/2012   Procedure: LEFT HEART CATHETERIZATION WITH CORONARY ANGIOGRAM;  Surgeon: Laurey Morale, MD;  Location: [facility] CATH LAB;  Service: Cardiovascular;  Laterality: N/A;  . PATELLAR TENDON REPAIR  1990's   left  . PERCUTANEOUS CORONARY STENT INTERVENTION (PCI-S) N/A 05/14/2012   Procedure: PERCUTANEOUS CORONARY STENT INTERVENTION (PCI-S);  Surgeon: Kathleene Hazel, MD;  Location: [facility] CATH LAB;  Service: Cardiovascular;  Laterality: N/A;    FAMHx:  Family History  Problem Relation Age of Onset  . Hypertension Mother   . Cancer Mother   . Diabetes Mother   . Stroke Mother   .  Cancer Father     SOCHx:   reports that he quit smoking about 14 years ago. His smoking use included cigarettes. He has a 9.00 pack-year smoking history. He has never used smokeless tobacco. He reports that he does not drink alcohol or use drugs.  ALLERGIES:  Allergies  Allergen Reactions  . Bee Venom Swelling  . Food Allergy Formula Swelling    "Crab in stuffing; I eat shrimp and I don't have problems"  . Crab [Shellfish Allergy]   . Nutritional Supplements Swelling    unknown    ROS: Pertinent items noted in HPI and remainder of comprehensive ROS otherwise negative.  HOME MEDS: Current Outpatient Medications on File Prior to Visit  Medication Sig Dispense Refill   . amLODipine (NORVASC) 10 MG tablet TAKE 1 TABLET BY MOUTH EVERY DAY 90 tablet 3  . aspirin EC 81 MG tablet Take 81 mg by mouth daily.    Marland Kitchen. ezetimibe (ZETIA) 10 MG tablet TAKE 1 TABLET BY MOUTH EVERY DAY 30 tablet 6  . hydrochlorothiazide (HYDRODIURIL) 25 MG tablet Take 25 mg by mouth daily.  5  . isosorbide mononitrate (IMDUR) 60 MG 24 hr tablet Take 90 mg by mouth daily.  3  . JARDIANCE 25 MG TABS tablet Take 25 mg by mouth daily.  5  . lisinopril (PRINIVIL,ZESTRIL) 40 MG tablet TAKE 1 TABLET BY MOUTH EVERY DAY 30 tablet 11  . metFORMIN (GLUCOPHAGE) 1000 MG tablet Take 1,000 mg by mouth 2 (two) times daily.  5  . nebivolol (BYSTOLIC) 10 MG tablet Take 1.5 tablets (15 mg total) by mouth daily. 135 tablet 3  . nitroGLYCERIN (NITROSTAT) 0.4 MG SL tablet Place 1 tablet (0.4 mg total) under the tongue every 5 (five) minutes as needed for chest pain (up to 3 doses). 75 tablet 4  . pioglitazone (ACTOS) 30 MG tablet Take 30 mg by mouth daily.  5  . Vitamin D, Ergocalciferol, (DRISDOL) 50000 units CAPS capsule Take 50,000 Units by mouth once a week.  5  . atorvastatin (LIPITOR) 80 MG tablet Take 1 tablet (80 mg total) by mouth daily. 90 tablet 3  . isosorbide mononitrate (IMDUR) 60 MG 24 hr tablet Take 1.5 tablets (90 mg total) by mouth daily. (Patient taking differently: Take 60 mg by mouth daily. ) 135 tablet 3   No current facility-administered medications on file prior to visit.     LABS/IMAGING: No results found for this or any previous visit (from the past 48 hour(s)). No results found.  LIPID PANEL:    Component Value Date/Time   CHOL 126 02/24/2018 0926   TRIG 149 02/24/2018 0926   HDL 29 (L) 02/24/2018 0926   CHOLHDL 4.3 02/24/2018 0926   CHOLHDL 4 10/06/2013 0809   VLDL 36.6 10/06/2013 0809   LDLCALC 67 02/24/2018 0926     WEIGHTS: Wt Readings from Last 3 Encounters:  04/07/18 204 lb (92.5 kg)  02/24/18 206 lb (93.4 kg)  09/24/17 213 lb 6.4 oz (96.8 kg)    VITALS: BP  (!) 150/90   Pulse 69   Ht 5\' 7"  (1.702 m)   Wt 204 lb (92.5 kg)   BMI 31.95 kg/m   EXAM: Deferred  EKG: Deferred  ASSESSMENT: 1. Mixed dyslipidemia (high LDL, triglycerides and low HDL) 2. ASCVD with prior coronary artery intervention 3. Hypertension 4. Diabetes type 2  PLAN: 1.   John May has had significant improvement in his dyslipidemia with dietary intervention.  He is now on high intensity statin  and ezetimibe.  He is also on Jardiance.  This is all evidence-based treatment for maximal cardiovascular risk reduction.  Overall he is doing well and have encouraged continued physical activity as well.  No changes in medications today.  He can follow-up with me in the lipid clinic as needed.  Follow-up PRN. Thanks for allowing me to participate in his care.  Chrystie Nose, MD, [facility], FACP  [place]  [facility]  Medical Director of the [facility] &  Cardiovascular Risk Reduction Clinic Diplomate of the American Board of Clinical Lipidology Attending Cardiologist  Direct Dial: [phone]  Fax: [phone]  Website:  [email or web address] 04/07/2018, 8:46 AM

## 2018-04-27 ENCOUNTER — Other Ambulatory Visit: Payer: Self-pay | Admitting: Cardiology

## 2018-04-27 DIAGNOSIS — I2510 Atherosclerotic heart disease of native coronary artery without angina pectoris: Secondary | ICD-10-CM

## 2018-05-19 ENCOUNTER — Telehealth: Payer: Self-pay

## 2018-05-19 NOTE — Telephone Encounter (Signed)
Spoke with pt who states he was told by his pcp and pharmacy on the days he take sildenafil to hold imdur. Pt advised per Dr. Jens Som is he to completely stop the Imdur as the two shouldn't be taking together. Pt verbalized understanding.

## 2018-05-19 NOTE — Telephone Encounter (Signed)
Called pt to verify whether he is currently taking Sildenafil. Per Dr. Jens Som pt is to d/c Imdur or sildenafil as the two should not be taking together. Left message to call back.

## 2018-07-31 ENCOUNTER — Other Ambulatory Visit: Payer: Self-pay | Admitting: Cardiology

## 2018-07-31 DIAGNOSIS — I2510 Atherosclerotic heart disease of native coronary artery without angina pectoris: Secondary | ICD-10-CM

## 2018-09-23 NOTE — Progress Notes (Signed)
HPI: FU coronary artery disease. Patient suffered a myocardial infarction in May of 2011. Attempt at PCI of an occluded RCA was not successful and the patient was treated medically. Ejection fraction was 45-50%. Patient had an echocardiogram in May of 2011 that showed an ejection fraction of 55-60%. There was mild hypokinesis of the basal and mid inferior wall. The left atrium was mildly dilated. The right ventricle was mildly dilated. Patient continued to have symptoms and had cath 9/13 that revealed Normal LM, 40 mid and 60 distal LAD, 30 diagonal, 40 Lcx, 99 mid RCA; EF 55; patient had DES to RCA at that time. Patient again admitted with continuing CP in March of 2013; enzymes neg; myoview 3/14 with EF 61; anteroseptal ischemia; inferior MI vs diaphragm. Cath 3/14 - LM luminal irreg, LAD 50 prox, 50 and 60 mid, 70 distal, D1 with 80 distal, Ramus with 70 mid, OM1 with 50, 50 distal left circ; RCA with patent stent, 90 PDA; medical therapy recommended. Nuclear study 2/16 showed EF 53, mild distal anterior ischemia and small previous infarct. Monitor 10/17 showed sinus to sinus tach.Since I last saw him, occasional dyspnea, fatigue and chest pain unchanged compared to previous.  Current Outpatient Medications  Medication Sig Dispense Refill  . amLODipine (NORVASC) 10 MG tablet TAKE 1 TABLET BY MOUTH EVERY DAY 90 tablet 3  . aspirin EC 81 MG tablet Take 81 mg by mouth daily.    Marland Kitchen atorvastatin (LIPITOR) 80 MG tablet Take 1 tablet (80 mg total) by mouth daily. 90 tablet 3  . ezetimibe (ZETIA) 10 MG tablet TAKE 1 TABLET BY MOUTH EVERY DAY 30 tablet 6  . hydrochlorothiazide (HYDRODIURIL) 25 MG tablet Take 25 mg by mouth daily.  5  . JARDIANCE 25 MG TABS tablet Take 25 mg by mouth daily.  5  . lisinopril (PRINIVIL,ZESTRIL) 40 MG tablet TAKE 1 TABLET BY MOUTH EVERY DAY 30 tablet 11  . metFORMIN (GLUCOPHAGE) 1000 MG tablet Take 1,000 mg by mouth 2 (two) times daily.  5  . nebivolol (BYSTOLIC) 10 MG  tablet Take 1.5 tablets (15 mg total) by mouth daily. 45 tablet 5  . nitroGLYCERIN (NITROSTAT) 0.4 MG SL tablet Place 1 tablet (0.4 mg total) under the tongue every 5 (five) minutes as needed for chest pain (up to 3 doses). 75 tablet 4  . pioglitazone (ACTOS) 30 MG tablet Take 30 mg by mouth daily.  5  . Vitamin D, Ergocalciferol, (DRISDOL) 50000 units CAPS capsule Take 50,000 Units by mouth once a week.  5   No current facility-administered medications for this visit.      Past Medical History:  Diagnosis Date  . CAD (coronary artery disease)    a. 01/2010 - MI with unsuccessful PCI of RCA. b. s/p PCI/DES to mid RCA 05/2012. c. Cath 11/2012 for abnl stress test - diffuse branch and distal vessel CAD for med rx.    . Diabetes mellitus (HCC)    a. A1c 6.9 in 11/2012.  Marland Kitchen Hyperlipidemia   . Hypertension   . OSA on CPAP   . Paresthesias    a. bilat upper & lower extremities - intermittently present since 2011.    Past Surgical History:  Procedure Laterality Date  . CORONARY ANGIOPLASTY WITH STENT PLACEMENT  05/14/2012   "1"  . LEFT HEART CATHETERIZATION WITH CORONARY ANGIOGRAM N/A 11/25/2012   Procedure: LEFT HEART CATHETERIZATION WITH CORONARY ANGIOGRAM;  Surgeon: Laurey Morale, MD;  Location: [facility] CATH LAB;  Service: Cardiovascular;  Laterality: N/A;  . PATELLAR TENDON REPAIR  1990's   left  . PERCUTANEOUS CORONARY STENT INTERVENTION (PCI-S) N/A 05/14/2012   Procedure: PERCUTANEOUS CORONARY STENT INTERVENTION (PCI-S);  Surgeon: Kathleene Hazelhristopher D McAlhany, MD;  [facility];  Service: Cardiovascular;  Laterality: N/A;    Social History   Socioeconomic History  . Marital status: Married    Spouse name: Not on file  . Number of children: 2  . Years of education: Not on file  . Highest education level: Not on file  Occupational History    Employer: UNEMPLOYED  Social Needs  . Financial resource strain: Not on file  . Food insecurity:    Worry: Not on file    Inability: Not on  file  . Transportation needs:    Medical: Not on file    Non-medical: Not on file  Tobacco Use  . Smoking status: Former Smoker    Packs/day: 0.50    Years: 18.00    Pack years: 9.00    Types: Cigarettes    Last attempt to quit: 11/26/2003    Years since quitting: 14.8  . Smokeless tobacco: Never Used  Substance and Sexual Activity  . Alcohol use: No    Alcohol/week: 0.0 standard drinks  . Drug use: No  . Sexual activity: Yes  Lifestyle  . Physical activity:    Days per week: Not on file    Minutes per session: Not on file  . Stress: Not on file  Relationships  . Social connections:    Talks on phone: Not on file    Gets together: Not on file    Attends religious service: Not on file    Active member of club or organization: Not on file    Attends meetings of clubs or organizations: Not on file    Relationship status: Not on file  . Intimate partner violence:    Fear of current or ex partner: Not on file    Emotionally abused: Not on file    Physically abused: Not on file    Forced sexual activity: Not on file  Other Topics Concern  . Not on file  Social History Narrative  . Not on file    Family History  Problem Relation Age of Onset  . Hypertension Mother   . Cancer Mother   . Diabetes Mother   . Stroke Mother   . Cancer Father     ROS: no fevers or chills, productive cough, hemoptysis, dysphasia, odynophagia, melena, hematochezia, dysuria, hematuria, rash, seizure activity, orthopnea, PND, pedal edema, claudication. Remaining systems are negative.  Physical Exam: Well-developed well-nourished in no acute distress.  Skin is warm and dry.  HEENT is normal.  Neck is supple.  Chest is clear to auscultation with normal expansion.  Cardiovascular exam is regular rate and rhythm.  Abdominal exam nontender or distended. No masses palpated. Extremities show no edema. neuro grossly intact  ECG-sinus rhythm at a rate of 71, left ventricular hypertrophy, inferior  infarct, nonspecific ST changes.  Personally reviewed  A/P  1 coronary artery disease-patient continues with occasional chest pain and dyspnea unchanged compared to previous visit.  These have been chronic symptoms.  We will continue medical therapy with aspirin and statin.  2 hypertension-patient's blood pressure is elevated today.  He has not taken all of his medications.  He will follow and we will adjust regimen as needed.  Check potassium and renal function.  3 hyperlipidemia-continue present regimen.  Check lipids and  liver.  4 palpitations-continue beta-blocker.  No recent symptoms.  Olga MillersBrian Crenshaw, MD

## 2018-09-29 ENCOUNTER — Ambulatory Visit (INDEPENDENT_AMBULATORY_CARE_PROVIDER_SITE_OTHER): Payer: Medicare Other | Admitting: Cardiology

## 2018-09-29 ENCOUNTER — Encounter (INDEPENDENT_AMBULATORY_CARE_PROVIDER_SITE_OTHER): Payer: Self-pay

## 2018-09-29 ENCOUNTER — Encounter: Payer: Self-pay | Admitting: Cardiology

## 2018-09-29 VITALS — BP 150/80 | HR 71 | Ht 67.0 in | Wt 208.6 lb

## 2018-09-29 DIAGNOSIS — I10 Essential (primary) hypertension: Secondary | ICD-10-CM

## 2018-09-29 DIAGNOSIS — E782 Mixed hyperlipidemia: Secondary | ICD-10-CM

## 2018-09-29 DIAGNOSIS — I2510 Atherosclerotic heart disease of native coronary artery without angina pectoris: Secondary | ICD-10-CM | POA: Diagnosis not present

## 2018-09-29 LAB — COMPREHENSIVE METABOLIC PANEL
ALBUMIN: 4.3 g/dL (ref 4.0–5.0)
ALK PHOS: 65 IU/L (ref 39–117)
ALT: 28 IU/L (ref 0–44)
AST: 13 IU/L (ref 0–40)
Albumin/Globulin Ratio: 1.4 (ref 1.2–2.2)
BUN / CREAT RATIO: 17 (ref 9–20)
BUN: 21 mg/dL (ref 6–24)
Bilirubin Total: 0.3 mg/dL (ref 0.0–1.2)
CHLORIDE: 99 mmol/L (ref 96–106)
CO2: 25 mmol/L (ref 20–29)
Calcium: 9.7 mg/dL (ref 8.7–10.2)
Creatinine, Ser: 1.22 mg/dL (ref 0.76–1.27)
GFR calc Af Amer: 79 mL/min/{1.73_m2} (ref >59)
GFR calc non Af Amer: 69 mL/min/{1.73_m2} (ref >59)
Globulin, Total: 3 g/dL (ref 1.5–4.5)
Glucose: 126 mg/dL — ABNORMAL HIGH (ref 65–99)
Potassium: 4.4 mmol/L (ref 3.5–5.2)
Sodium: 141 mmol/L (ref 134–144)
Total Protein: 7.3 g/dL (ref 6.0–8.5)

## 2018-09-29 LAB — LIPID PANEL
CHOLESTEROL TOTAL: 199 mg/dL (ref 100–199)
Chol/HDL Ratio: 6.4 ratio — ABNORMAL HIGH (ref 0.0–5.0)
HDL: 31 mg/dL — AB (ref >39)
LDL Calculated: 135 mg/dL — ABNORMAL HIGH (ref 0–99)
Triglycerides: 164 mg/dL — ABNORMAL HIGH (ref 0–149)
VLDL Cholesterol Cal: 33 mg/dL (ref 5–40)

## 2018-09-29 NOTE — Patient Instructions (Signed)
Medication Instructions:  NO CHANGE If you need a refill on your cardiac medications before your next appointment, please call your pharmacy.   Lab work: Your physician recommends that you HAVE LAB WORK TODAY If you have labs (blood work) drawn today and your tests are completely normal, you will receive your results only by: Marland Kitchen MyChart Message (if you have MyChart) OR . A paper copy in the mail If you have any lab test that is abnormal or we need to change your treatment, we will call you to review the results.  Follow-Up: At [facility], you and your health needs are our priority.  As part of our continuing mission to provide you with exceptional heart care, we have created designated Provider Care Teams.  These Care Teams include your primary Cardiologist (physician) and Advanced Practice Providers (APPs -  Physician Assistants and Nurse Practitioners) who all work together to provide you with the care you need, when you need it. You will need a follow up appointment in 6 months.  Please call our office 2 months in advance to schedule this appointment.  You may see Olga Millers MD or one of the following Advanced Practice Providers on your designated Care Team:   Corine Shelter, PA-C Judy Pimple, [place] . Marjie Skiff, PA-C  CALL IN MAY TO SCHEDULE APPOINTMENT IN [place]

## 2018-10-02 ENCOUNTER — Ambulatory Visit (INDEPENDENT_AMBULATORY_CARE_PROVIDER_SITE_OTHER): Payer: Medicare Other | Admitting: Pharmacist Clinician (PhC)/ Clinical Pharmacy Specialist

## 2018-10-02 DIAGNOSIS — I2510 Atherosclerotic heart disease of native coronary artery without angina pectoris: Secondary | ICD-10-CM

## 2018-10-02 DIAGNOSIS — E782 Mixed hyperlipidemia: Secondary | ICD-10-CM | POA: Diagnosis not present

## 2018-10-02 MED ORDER — ROSUVASTATIN CALCIUM 40 MG PO TABS: 40.0000 mg | ORAL_TABLET | Freq: Every day | ORAL | 3 refills | Status: DC

## 2018-10-02 NOTE — Patient Instructions (Signed)
When you run out of atorvastatin you can switch to rosuvastatin 40 mg once daily.    We will repeat your cholesterol labs in early April.     Cholesterol  Cholesterol is a fat. Your body needs a small amount of cholesterol. Cholesterol (plaque) may build up in your blood vessels (arteries). That makes you more likely to have a heart attack or stroke. You cannot feel your cholesterol level. Having a blood test is the only way to find out if your level is high. Keep your test results. Work with your doctor to keep your cholesterol at a good level. What do the results mean?  Total cholesterol is how much cholesterol is in your blood.  LDL is bad cholesterol. This is the type that can build up. Try to have low LDL.  HDL is good cholesterol. It cleans your blood vessels and carries LDL away. Try to have high HDL.  Triglycerides are fat that the body can store or burn for energy. What are good levels of cholesterol?  Total cholesterol below 200.  LDL below 100 is good for people who have health risks. LDL below 70 is good for people who have very high risks.  HDL above 40 is good. It is best to have HDL of 60 or higher.  Triglycerides below 150. How can I lower my cholesterol? Diet Follow your diet program as told by your doctor.  Choose fish, white meat chicken, or [place] that is roasted or baked. Try not to eat red meat, fried foods, sausage, or lunch meats.  Eat lots of fresh fruits and vegetables.  Choose whole grains, beans, pasta, potatoes, and cereals.  Choose olive oil, corn oil, or canola oil. Only use small amounts.  Try not to eat butter, mayonnaise, shortening, or palm kernel oils.  Try not to eat foods with trans fats.  Choose low-fat or nonfat dairy foods. ? Drink skim or nonfat milk. ? Eat low-fat or nonfat yogurt and cheeses. ? Try not to drink whole milk or cream. ? Try not to eat ice cream, egg yolks, or full-fat cheeses.  Healthy desserts include angel  food cake, ginger snaps, animal crackers, hard candy, popsicles, and low-fat or nonfat frozen yogurt. Try not to eat pastries, cakes, pies, and cookies.  Exercise Follow your exercise program as told by your doctor.  Be more active. Try gardening, walking, and taking the stairs.  Ask your doctor about ways that you can be more active. Medicine  Take over-the-counter and prescription medicines only as told by your doctor. This information is not intended to replace advice given to you by your health care provider. Make sure you discuss any questions you have with your health care provider. Document Released: 11/23/2008 Document Revised: 03/28/2016 Document Reviewed: 03/08/2016 Elsevier Interactive Patient Education  2019 [employer].

## 2018-10-02 NOTE — Progress Notes (Signed)
10/02/2018 John May C Harrel [birth year] [id number]   HPI:  John GreaserRon C Zirkelbach is a 53 y.o. male patient of Dr Jens Somrenshaw, who presents today for a lipid clinic evaluation.  In addition to hyperlipidemia, his medical history is significant for ASCVD with prior MI, hypertension, DM2 (no current A1c available) and OSA (on CPAP).   Mr Chestine SporeClark suffered his first MI in 2011 at the age of 53.  Two years later a heart cath was done with DES to RCA.  He continued to have chest pain and a repeat cath was done in 2014.  Medical therapy was recommended.  A renal artery duplex scan was also done in 2013 which showed normal renal arteries.    Today he tells me that he has not had any problems with compliance, but does admit the atorvastatin tablets are difficult to swallow.  He believes the jump in his LDL cholesterol (was 67 in June 2019) is due to poor eating habits from Thanksgiving to [place].  He states he ate out quite a bit, which he doesn't normally do, and also ate chitlins quite regularly.    Current Medications: atorvastatin 80 mg qd, ezetimibe 10 mg qd  Cholesterol Goals: LDL < 70  Family history: mgf died from MI in late 5760's  Father died at 6470  Mother doing well at hypertension, diabetes  5 siblings (all older) - none with events, several with hypertension  2 children, no issues at this time  Diet: admits to eating poorly from Thanksgiving to Christmas. Lots of chitlins.  Normally eats mostly chicken, rare beef or pork.  No fried foods; plenty of fruits/vegetables, salads  Exercise:  Walks regularly about 1/2 mile daily before starts to have problems.  Is hoping to get to gym soon for some time on stationary bikes  Labs:  09/2018:  TC 199, TG 164, HDL 31, LDL 135   Current Outpatient Medications  Medication Sig Dispense Refill  . amLODipine (NORVASC) 10 MG tablet TAKE 1 TABLET BY MOUTH EVERY DAY 90 tablet 3  . aspirin EC 81 MG tablet Take 81 mg by mouth daily.    Marland Kitchen. atorvastatin (LIPITOR) 80 MG tablet  Take 1 tablet (80 mg total) by mouth daily. 90 tablet 3  . ezetimibe (ZETIA) 10 MG tablet TAKE 1 TABLET BY MOUTH EVERY DAY 30 tablet 6  . hydrochlorothiazide (HYDRODIURIL) 25 MG tablet Take 25 mg by mouth daily.  5  . JARDIANCE 25 MG TABS tablet Take 25 mg by mouth daily.  5  . lisinopril (PRINIVIL,ZESTRIL) 40 MG tablet TAKE 1 TABLET BY MOUTH EVERY DAY 30 tablet 11  . metFORMIN (GLUCOPHAGE) 1000 MG tablet Take 1,000 mg by mouth 2 (two) times daily.  5  . nebivolol (BYSTOLIC) 10 MG tablet Take 1.5 tablets (15 mg total) by mouth daily. 45 tablet 5  . nitroGLYCERIN (NITROSTAT) 0.4 MG SL tablet Place 1 tablet (0.4 mg total) under the tongue every 5 (five) minutes as needed for chest pain (up to 3 doses). 75 tablet 4  . pioglitazone (ACTOS) 30 MG tablet Take 30 mg by mouth daily.  5  . rosuvastatin (CRESTOR) 40 MG tablet Take 1 tablet (40 mg total) by mouth daily. 90 tablet 3  . Vitamin D, Ergocalciferol, (DRISDOL) 50000 units CAPS capsule Take 50,000 Units by mouth once a week.  5   No current facility-administered medications for this visit.     Allergies  Allergen Reactions  . Bee Venom Swelling  . Food Allergy Formula Swelling    "  Crab in stuffing; I eat shrimp and I don't have problems"  . Crab [Shellfish Allergy]   . Nutritional Supplements Swelling    unknown    Past Medical History:  Diagnosis Date  . CAD (coronary artery disease)    a. 01/2010 - MI with unsuccessful PCI of RCA. b. s/p PCI/DES to mid RCA 05/2012. c. Cath 11/2012 for abnl stress test - diffuse branch and distal vessel CAD for med rx.    . Diabetes mellitus (HCC)    a. A1c 6.9 in 11/2012.  Marland Kitchen Hyperlipidemia   . Hypertension   . OSA on CPAP   . Paresthesias    a. bilat upper & lower extremities - intermittently present since 2011.    There were no vitals taken for this visit.   Mixed hyperlipidemia Patient with early onset CAD (MI at 53), currently on atorvastatin 80 plus ezetimibe 10.  He is doing well with both  and not currently interested in further treatment.  He would prefer to get back to his normal eating habits and repeat labs in 3 months.  We discussed the options of adding PCSK-9 inhibitor and potentially using bempedoic acid.  He has no interest in giving himself injections and does not want to be a "[place] pig" for any new drugs.  I have switched his atorvastatin to rosuvastatin 40 mg for easier swallowing and we will have him repeat labs in April.  Based on those results we can determine if he would need further therapy and see if he would re-consider further treatment.     Phillips Hay PharmD CPP [facility]

## 2018-10-02 NOTE — Assessment & Plan Note (Signed)
Patient with early onset CAD (MI at 58), currently on atorvastatin 80 plus ezetimibe 10.  He is doing well with both and not currently interested in further treatment.  He would prefer to get back to his normal eating habits and repeat labs in 3 months.  We discussed the options of adding PCSK-9 inhibitor and potentially using bempedoic acid.  He has no interest in giving himself injections and does not want to be a "[place] pig" for any new drugs.  I have switched his atorvastatin to rosuvastatin 40 mg for easier swallowing and we will have him repeat labs in April.  Based on those results we can determine if he would need further therapy and see if he would re-consider further treatment.

## 2018-11-24 ENCOUNTER — Other Ambulatory Visit: Payer: Self-pay | Admitting: Cardiology

## 2018-11-24 DIAGNOSIS — E7800 Pure hypercholesterolemia, unspecified: Secondary | ICD-10-CM

## 2019-01-08 ENCOUNTER — Other Ambulatory Visit: Payer: Self-pay | Admitting: Cardiology

## 2019-01-08 NOTE — Telephone Encounter (Signed)
Lisinopril refilled.

## 2019-01-19 ENCOUNTER — Other Ambulatory Visit: Payer: Self-pay

## 2019-01-19 MED ORDER — AMLODIPINE BESYLATE 10 MG PO TABS: ORAL_TABLET | ORAL | 3 refills | Status: DC

## 2019-03-09 ENCOUNTER — Other Ambulatory Visit: Payer: Self-pay | Admitting: Cardiology

## 2019-03-09 DIAGNOSIS — I2510 Atherosclerotic heart disease of native coronary artery without angina pectoris: Secondary | ICD-10-CM

## 2019-04-07 NOTE — Progress Notes (Signed)
Virtual Visit via Video Note   This visit type was conducted due to national recommendations for restrictions regarding the COVID-19 Pandemic (e.g. social distancing) in an effort to limit this patient's exposure and mitigate transmission in our community.  Due to his co-morbid illnesses, this patient is at least at moderate risk for complications without adequate follow up.  This format is felt to be most appropriate for this patient at this time.  All issues noted in this document were discussed and addressed.  A limited physical exam was performed with this format.  Please refer to the patient's chart for his consent to telehealth for [facility].   Date:  04/09/2019   ID:  John May, DOB March 26, 1966, MRN [id number]  Patient Location:Home Provider Location: Home  PCP:  [facility]  Cardiologist:  Dr Stanford Breed  Evaluation Performed:  Follow-Up Visit  Chief Complaint:  FU CAD  History of Present Illness:    FU coronary artery disease. Patient suffered a myocardial infarction in May of 2011. Attempt at PCI of an occluded RCA was not successful and the patient was treated medically. Ejection fraction was 45-50%. Patient had an echocardiogram in May of 2011 that showed an ejection fraction of 55-60%. There was mild hypokinesis of the basal and mid inferior wall. The left atrium was mildly dilated. The right ventricle was mildly dilated. Patient continued to have symptoms and had cath 9/13 that revealed Normal LM, 40 mid and 60 distal LAD, 30 diagonal, 40 Lcx, 99 mid RCA; EF 55; patient had DES to RCA at that time. Patient again admitted with continuing CP in March of 2013; enzymes neg; myoview 3/14 with EF 61; anteroseptal ischemia; inferior MI vs diaphragm. Cath 3/14 - LM luminal irreg, LAD 50 prox, 50 and 60 mid, 70 distal, D1 with 80 distal, Ramus with 70 mid, OM1 with 50, 50 distal left circ; RCA with patent stent, 90 PDA; medical therapy recommended. Nuclear study 2/16 showed EF 53,  mild distal anterior ischemia and small previous infarct. Monitor 10/17 showed sinus to sinus tach.Since I last saw him, he has some dyspnea on exertion and occasional chest pain which is unchanged compared to previous.  His symptoms have not worsened.  He has not had syncope.  The patient does not have symptoms concerning for COVID-19 infection (fever, chills, cough, or new shortness of breath).    Past Medical History:  Diagnosis Date   CAD (coronary artery disease)    a. 01/2010 - MI with unsuccessful PCI of RCA. b. s/p PCI/DES to mid RCA 05/2012. c. Cath 11/2012 for abnl stress test - diffuse branch and distal vessel CAD for med rx.     Diabetes mellitus ([place])    a. A1c 6.9 in 11/2012.   Hyperlipidemia    Hypertension    OSA on CPAP    Paresthesias    a. bilat upper & lower extremities - intermittently present since 2011.   Past Surgical History:  Procedure Laterality Date   CORONARY ANGIOPLASTY WITH STENT PLACEMENT  05/14/2012   "1"   LEFT HEART CATHETERIZATION WITH CORONARY ANGIOGRAM N/A 11/25/2012   Procedure: LEFT HEART CATHETERIZATION WITH CORONARY ANGIOGRAM;  Surgeon: Larey Dresser, MD;  Location: [facility] CATH LAB;  Service: Cardiovascular;  Laterality: N/A;   PATELLAR TENDON REPAIR  1990's   left   PERCUTANEOUS CORONARY STENT INTERVENTION (PCI-S) N/A 05/14/2012   Procedure: PERCUTANEOUS CORONARY STENT INTERVENTION (PCI-S);  Surgeon: Burnell Blanks, MD;  Location: [facility] CATH LAB;  Service:  Cardiovascular;  Laterality: N/A;     Current Meds  Medication Sig   amLODipine (NORVASC) 10 MG tablet TAKE 1 TABLET BY MOUTH EVERY DAY   aspirin EC 81 MG tablet Take 81 mg by mouth daily.   atorvastatin (LIPITOR) 80 MG tablet Take 1 tablet (80 mg total) by mouth daily.   ezetimibe (ZETIA) 10 MG tablet TAKE 1 TABLET BY MOUTH EVERY DAY   hydrochlorothiazide (HYDRODIURIL) 25 MG tablet Take 25 mg by mouth daily.   JARDIANCE 25 MG TABS tablet Take 25 mg by mouth daily.    lisinopril (ZESTRIL) 40 MG tablet TAKE 1 TABLET BY MOUTH EVERY DAY   metFORMIN (GLUCOPHAGE) 1000 MG tablet Take 1,000 mg by mouth 2 (two) times daily.   nitroGLYCERIN (NITROSTAT) 0.4 MG SL tablet Place 1 tablet (0.4 mg total) under the tongue every 5 (five) minutes as needed for chest pain (up to 3 doses).   pioglitazone (ACTOS) 30 MG tablet Take 30 mg by mouth daily.   rosuvastatin (CRESTOR) 40 MG tablet Take 1 tablet (40 mg total) by mouth daily.   Vitamin D, Ergocalciferol, (DRISDOL) 50000 units CAPS capsule Take 50,000 Units by mouth once a week.     Allergies:   Bee venom, Food allergy formula, Crab [shellfish allergy], and Nutritional supplements   Social History   Tobacco Use   Smoking status: Former Smoker    Packs/day: 0.50    Years: 18.00    Pack years: 9.00    Types: Cigarettes    Quit date: 11/26/2003    Years since quitting: 15.3   Smokeless tobacco: Never Used  Substance Use Topics   Alcohol use: No    Alcohol/week: 0.0 standard drinks   Drug use: No     Family Hx: The patient's family history includes Cancer in his father and mother; Diabetes in his mother; Hypertension in his mother; Stroke in his mother.  ROS:   Please see the history of present illness.    No Fever, chills  or productive cough All other systems reviewed and are negative.  Recent Labs: 09/29/2018: ALT 28; BUN 21; Creatinine, Ser 1.22; Potassium 4.4; Sodium 141   Recent Lipid Panel Lab Results  Component Value Date/Time   CHOL 199 09/29/2018 08:33 AM   TRIG 164 (H) 09/29/2018 08:33 AM   HDL 31 (L) 09/29/2018 08:33 AM   CHOLHDL 6.4 (H) 09/29/2018 08:33 AM   CHOLHDL 4 10/06/2013 08:09 AM   LDLCALC 135 (H) 09/29/2018 08:33 AM    Wt Readings from Last 3 Encounters:  04/09/19 210 lb (95.3 kg)  09/29/18 208 lb 9.6 oz (94.6 kg)  04/07/18 204 lb (92.5 kg)     Objective:    Vital Signs:  BP 129/89    Ht 5\' 7"  (1.702 m)    Wt 210 lb (95.3 kg)    BMI 32.89 kg/m    VITAL SIGNS:   reviewed NAD Answers questions appropriately Normal affect Remainder of physical examination not performed (telehealth visit; coronavirus pandemic)  ASSESSMENT & PLAN:    1. Coronary artery disease-patient symptoms are unchanged.  He does have occasional dyspnea and chest pain but similar to previous symptoms.  We will continue medical therapy with aspirin and statin.  He discontinued his Bystolic on his own.  I will add Toprol 25 mg daily as I would like for him to be on a beta-blocker long-term. 2. Hypertension-patient's blood pressure is controlled.  Continue present medications and follow.  Check potassium and renal function. 3. Hyperlipidemia-continue  present medications.  Check lipids and liver. 4. History of palpitations-occasional elevated heart rate.  Add Toprol as outlined above and follow.  COVID-19 Education: The importance of social distancing was discussed today.  Time:   Today, I have spent 18 minutes with the patient with telehealth technology discussing the above problems.     Medication Adjustments/Labs and Tests Ordered: Current medicines are reviewed at length with the patient today.  Concerns regarding medicines are outlined above.   Tests Ordered: No orders of the defined types were placed in this encounter.   Medication Changes: No orders of the defined types were placed in this encounter.   Follow Up:  Virtual Visit or In Person in 6 month(s)  Signed, Olga MillersBrian Keirstyn Aydt, MD  04/09/2019 9:26 AM    [facility]

## 2019-04-08 ENCOUNTER — Telehealth: Payer: Self-pay | Admitting: Cardiology

## 2019-04-08 NOTE — Telephone Encounter (Signed)

## 2019-04-09 ENCOUNTER — Telehealth (INDEPENDENT_AMBULATORY_CARE_PROVIDER_SITE_OTHER): Payer: Medicare Other | Admitting: Cardiology

## 2019-04-09 VITALS — BP 129/89 | Ht 67.0 in | Wt 210.0 lb

## 2019-04-09 DIAGNOSIS — E782 Mixed hyperlipidemia: Secondary | ICD-10-CM

## 2019-04-09 DIAGNOSIS — R002 Palpitations: Secondary | ICD-10-CM | POA: Diagnosis not present

## 2019-04-09 DIAGNOSIS — I2510 Atherosclerotic heart disease of native coronary artery without angina pectoris: Secondary | ICD-10-CM

## 2019-04-09 DIAGNOSIS — I10 Essential (primary) hypertension: Secondary | ICD-10-CM

## 2019-04-09 MED ORDER — METOPROLOL SUCCINATE ER 25 MG PO TB24: 25.0000 mg | ORAL_TABLET | Freq: Every day | ORAL | 3 refills | Status: DC

## 2019-04-09 NOTE — Patient Instructions (Signed)
Medication Instructions:  START METOPROLOL SUCC ER 25 MG ONCE DAILY AT BEDTIME  If you need a refill on your cardiac medications before your next appointment, please call your pharmacy.   Lab work: Your physician recommends that you return for lab work PRIOR TO EATING  If you have labs (blood work) drawn today and your tests are completely normal, you will receive your results only by: Marland Kitchen MyChart Message (if you have MyChart) OR . A paper copy in the mail If you have any lab test that is abnormal or we need to change your treatment, we will call you to review the results.  Follow-Up: At [facility], you and your health needs are our priority.  As part of our continuing mission to provide you with exceptional heart care, we have created designated Provider Care Teams.  These Care Teams include your primary Cardiologist (physician) and Advanced Practice Providers (APPs -  Physician Assistants and Nurse Practitioners) who all work together to provide you with the care you need, when you need it. You will need a follow up appointment in 6 months.  Please call our office 2 months in advance to schedule this appointment.  You may see Kirk Ruths MD or one of the following Advanced Practice Providers on your designated Care Team:   Kerin Ransom, PA-C Roby Lofts, [place] . Sande Rives, PA-C

## 2019-04-14 LAB — COMPREHENSIVE METABOLIC PANEL
ALT: 23 IU/L (ref 0–44)
AST: 16 IU/L (ref 0–40)
Albumin/Globulin Ratio: 1.7 (ref 1.2–2.2)
Albumin: 4.3 g/dL (ref 4.0–5.0)
Alkaline Phosphatase: 72 IU/L (ref 39–117)
BUN/Creatinine Ratio: 13 (ref 9–20)
BUN: 14 mg/dL (ref 6–24)
Bilirubin Total: 0.3 mg/dL (ref 0.0–1.2)
CO2: 24 mmol/L (ref 20–29)
Calcium: 9.1 mg/dL (ref 8.7–10.2)
Chloride: 102 mmol/L (ref 96–106)
Creatinine, Ser: 1.11 mg/dL (ref 0.76–1.27)
GFR calc Af Amer: 89 mL/min/{1.73_m2} (ref >59)
GFR calc non Af Amer: 77 mL/min/{1.73_m2} (ref >59)
Globulin, Total: 2.5 g/dL (ref 1.5–4.5)
Glucose: 132 mg/dL — ABNORMAL HIGH (ref 65–99)
Potassium: 4.4 mmol/L (ref 3.5–5.2)
Sodium: 141 mmol/L (ref 134–144)
Total Protein: 6.8 g/dL (ref 6.0–8.5)

## 2019-04-14 LAB — LIPID PANEL
Chol/HDL Ratio: 5.4 ratio — ABNORMAL HIGH (ref 0.0–5.0)
Cholesterol, Total: 173 mg/dL (ref 100–199)
HDL: 32 mg/dL — ABNORMAL LOW (ref >39)
LDL Calculated: 113 mg/dL — ABNORMAL HIGH (ref 0–99)
Triglycerides: 139 mg/dL (ref 0–149)
VLDL Cholesterol Cal: 28 mg/dL (ref 5–40)

## 2019-04-16 ENCOUNTER — Other Ambulatory Visit: Payer: Self-pay | Admitting: *Deleted

## 2019-04-16 DIAGNOSIS — E7800 Pure hypercholesterolemia, unspecified: Secondary | ICD-10-CM

## 2019-05-28 ENCOUNTER — Other Ambulatory Visit: Payer: Self-pay | Admitting: Cardiology

## 2019-10-05 NOTE — Progress Notes (Signed)
Virtual Visit via Video Note   This visit type was conducted due to national recommendations for restrictions regarding the COVID-19 Pandemic (e.g. social distancing) in an effort to limit this patient's exposure and mitigate transmission in our community.  Due to her co-morbid illnesses, this patient is at least at moderate risk for complications without adequate follow up.  This format is felt to be most appropriate for this patient at this time.  All issues noted in this document were discussed and addressed.  A limited physical exam was performed with this format.  Please refer to the patient's chart for her consent to telehealth for [facility].   HPI: FU coronary artery disease. Patient suffered a myocardial infarction in May of 2011. Attempt at PCI of an occluded RCA was not successful and the patient was treated medically. Ejection fraction was 45-50%. Patient had an echocardiogram in May of 2011 that showed an ejection fraction of 55-60%. There was mild hypokinesis of the basal and mid inferior wall. The left atrium was mildly dilated. The right ventricle was mildly dilated. Patient continued to have symptoms and had cath 9/13 that revealed Normal LM, 40 mid and 60 distal LAD, 30 diagonal, 40 Lcx, 99 mid RCA; EF 55; patient had DES to RCA at that time. Patient again admitted with continuing CP in March of 2013; enzymes neg; myoview 3/14 with EF 61; anteroseptal ischemia; inferior MI vs diaphragm. Cath 3/14 - LM luminal irreg, LAD 50 prox, 50 and 60 mid, 70 distal, D1 with 80 distal, Ramus with 70 mid, OM1 with 50, 50 distal left circ; RCA with patent stent, 90 PDA; medical therapy recommended. Nuclear study 2/16 showed EF 53, mild distal anterior ischemia and small previous infarct. Monitor 10/17 showed sinus to sinus tach.Since I last saw him,  patient has dyspnea with more vigorous activities.  Occasional brief chest pain unchanged.  No palpitations or syncope.  Current Outpatient  Medications  Medication Sig Dispense Refill  . amLODipine (NORVASC) 10 MG tablet TAKE 1 TABLET BY MOUTH EVERY DAY 90 tablet 3  . aspirin EC 81 MG tablet Take 81 mg by mouth daily.    Marland Kitchen atorvastatin (LIPITOR) 80 MG tablet Take 1 tablet (80 mg total) by mouth daily. 90 tablet 3  . ezetimibe (ZETIA) 10 MG tablet TAKE 1 TABLET BY MOUTH EVERY DAY 30 tablet 6  . hydrochlorothiazide (HYDRODIURIL) 25 MG tablet Take 25 mg by mouth daily.  5  . JARDIANCE 25 MG TABS tablet Take 25 mg by mouth daily.  5  . lisinopril (ZESTRIL) 40 MG tablet TAKE 1 TABLET BY MOUTH EVERY DAY 30 tablet 11  . metFORMIN (GLUCOPHAGE) 1000 MG tablet Take 1,000 mg by mouth 2 (two) times daily.  5  . metoprolol succinate (TOPROL XL) 25 MG 24 hr tablet Take 1 tablet (25 mg total) by mouth at bedtime. 90 tablet 3  . nitroGLYCERIN (NITROSTAT) 0.4 MG SL tablet Place 1 tablet (0.4 mg total) under the tongue every 5 (five) minutes as needed for chest pain (up to 3 doses). 75 tablet 4  . pioglitazone (ACTOS) 30 MG tablet Take 30 mg by mouth daily.  5  . rosuvastatin (CRESTOR) 40 MG tablet Take 1 tablet (40 mg total) by mouth daily. 90 tablet 3  . Vitamin D, Ergocalciferol, (DRISDOL) 50000 units CAPS capsule Take 50,000 Units by mouth once a week.  5   No current facility-administered medications for this visit.     Past Medical History:  Diagnosis Date  .  CAD (coronary artery disease)    a. 01/2010 - MI with unsuccessful PCI of RCA. b. s/p PCI/DES to mid RCA 05/2012. c. Cath 11/2012 for abnl stress test - diffuse branch and distal vessel CAD for med rx.    . Diabetes mellitus (HCC)    a. A1c 6.9 in 11/2012.  Marland Kitchen Hyperlipidemia   . Hypertension   . OSA on CPAP   . Paresthesias    a. bilat upper & lower extremities - intermittently present since 2011.    Past Surgical History:  Procedure Laterality Date  . CORONARY ANGIOPLASTY WITH STENT PLACEMENT  05/14/2012   "1"  . LEFT HEART CATHETERIZATION WITH CORONARY ANGIOGRAM N/A 11/25/2012    Procedure: LEFT HEART CATHETERIZATION WITH CORONARY ANGIOGRAM;  Surgeon: Laurey Morale, MD;  Location: [facility] C[facility] LAB;  Service: Cardiovascular;  Laterality: N/A;  . PATELLAR TENDON REPAIR  1990's   left  . PERCUTANEOUS CORONARY STENT INTERVENTION (PCI-S) N/A 05/14/2012   Procedure: PERCUTANEOUS CORONARY STENT INTERVENTION (PCI-S);  Surgeon: Kathleene Hazel, MD;  Location: [facility] CATH LAB;  Service: Cardiovascular;  Laterality: N/A;    Social History   Socioeconomic History  . Marital status: Married    Spouse name: Not on file  . Number of children: 2  . Years of education: Not on file  . Highest education level: Not on file  Occupational History    Employer: UNEMPLOYED  Tobacco Use  . Smoking status: Former Smoker    Packs/day: 0.50    Years: 18.00    Pack years: 9.00    Types: Cigarettes    Quit date: 11/26/2003    Years since quitting: 15.8  . Smokeless tobacco: Never Used  Substance and Sexual Activity  . Alcohol use: No    Alcohol/week: 0.0 standard drinks  . Drug use: No  . Sexual activity: Yes  Other Topics Concern  . Not on file  Social History Narrative  . Not on file   Social Determinants of Health   Financial Resource Strain:   . Difficulty of Paying Living Expenses: Not on file  Food Insecurity:   . Worried About Programme researcher, broadcasting/film/video in the Last Year: Not on file  . Ran Out of Food in the Last Year: Not on file  Transportation Needs:   . Lack of Transportation (Medical): Not on file  . Lack of Transportation (Non-Medical): Not on file  Physical Activity:   . Days of Exercise per Week: Not on file  . Minutes of Exercise per Session: Not on file  Stress:   . Feeling of Stress : Not on file  Social Connections:   . Frequency of Communication with Friends and Family: Not on file  . Frequency of Social Gatherings with Friends and Family: Not on file  . Attends Religious Services: Not on file  . Active Member of Clubs or Organizations: Not on file  .  Attends Banker Meetings: Not on file  . Marital Status: Not on file  Intimate Partner Violence:   . Fear of Current or Ex-Partner: Not on file  . Emotionally Abused: Not on file  . Physically Abused: Not on file  . Sexually Abused: Not on file    Family History  Problem Relation Age of Onset  . Hypertension Mother   . Cancer Mother   . Diabetes Mother   . Stroke Mother   . Cancer Father     ROS: Fatigue but no fevers or chills, productive cough, hemoptysis, dysphasia, odynophagia,  melena, hematochezia, dysuria, hematuria, rash, seizure activity, orthopnea, PND, pedal edema, claudication. Remaining systems are negative.  Physical Exam: Answers questions appropriately Normal affect No acute distress Remainder physical examination not performed (telehealth visit; coronavirus pandemic)  A/P  1 coronary artery disease-patient continues to have occasional dyspnea and chest pain but unchanged compared to previous.  Plan to continue aspirin and statin.  Continue Toprol.  2 hypertension-blood pressure controlled.  Continue present medical regimen.  Check potassium and renal function.  3 hyperlipidemia-continue crestor and Zetia.  Check lipids and liver.  4 palpitations-symptoms well controlled.  Continue Toprol at present dose.  COVID-19 Education: The importance of social distancing was discussed today.  Time:   Today, I have spent 16 minutes with the patient with telehealth technology discussing the above problems.     Medication Adjustments/Labs and Tests Ordered: Current medicines are reviewed at length with the patient today.  Concerns regarding medicines are outlined above.   Tests Ordered: No orders of the defined types were placed in this encounter.   Medication Changes: No orders of the defined types were placed in this encounter.   Follow Up:  Either In Person or Virtual in 6 month(s)  Signed, Olga Millers, MD  10/05/2019 12:21 PM    [facility]

## 2019-10-08 ENCOUNTER — Telehealth (INDEPENDENT_AMBULATORY_CARE_PROVIDER_SITE_OTHER): Payer: Medicare Other | Admitting: Cardiology

## 2019-10-08 ENCOUNTER — Encounter: Payer: Self-pay | Admitting: Cardiology

## 2019-10-08 VITALS — BP 130/89 | HR 92 | Ht 67.0 in | Wt 215.0 lb

## 2019-10-08 DIAGNOSIS — I2510 Atherosclerotic heart disease of native coronary artery without angina pectoris: Secondary | ICD-10-CM | POA: Diagnosis not present

## 2019-10-08 DIAGNOSIS — R002 Palpitations: Secondary | ICD-10-CM

## 2019-10-08 DIAGNOSIS — I10 Essential (primary) hypertension: Secondary | ICD-10-CM

## 2019-10-08 DIAGNOSIS — E7800 Pure hypercholesterolemia, unspecified: Secondary | ICD-10-CM | POA: Diagnosis not present

## 2019-10-08 NOTE — Patient Instructions (Signed)
Medication Instructions:  Your physician recommends that you continue on your current medications as directed. Please refer to the Current Medication list given to you today.  *If you need a refill on your cardiac medications before your next appointment, please call your pharmacy*  Lab Work: (BMET, Lipid, Hepatic) one morning FASTING at your convenience.  If you have labs (blood work) drawn today and your tests are completely normal, you will receive your results only by: Marland Kitchen MyChart Message (if you have MyChart) OR . A paper copy in the mail If you have any lab test that is abnormal or we need to change your treatment, we will call you to review the results.  Testing/Procedures: NONE  Follow-Up: At [facility], you and your health needs are our priority.  As part of our continuing mission to provide you with exceptional heart care, we have created designated Provider Care Teams.  These Care Teams include your primary Cardiologist (physician) and Advanced Practice Providers (APPs -  Physician Assistants and Nurse Practitioners) who all work together to provide you with the care you need, when you need it.  Your next appointment:   6 month(s)  The format for your next appointment:   Either In Person or Virtual  Provider:   Olga Millers, MD

## 2019-10-17 ENCOUNTER — Other Ambulatory Visit: Payer: Self-pay | Admitting: Cardiology

## 2019-10-31 ENCOUNTER — Other Ambulatory Visit: Payer: Self-pay

## 2019-10-31 ENCOUNTER — Inpatient Hospital Stay (HOSPITAL_COMMUNITY)
Admission: EM | Admit: 2019-10-31 | Discharge: 2019-11-07 | DRG: 234 | Disposition: A | Discharge status: Home / Self Care | Payer: Medicare Other | Attending: Cardiothoracic Surgery | Admitting: Cardiothoracic Surgery

## 2019-10-31 ENCOUNTER — Encounter (HOSPITAL_COMMUNITY): Payer: Self-pay | Admitting: *Deleted

## 2019-10-31 ENCOUNTER — Emergency Department (HOSPITAL_COMMUNITY): Payer: Medicare Other

## 2019-10-31 DIAGNOSIS — Z8249 Family history of ischemic heart disease and other diseases of the circulatory system: Secondary | ICD-10-CM | POA: Diagnosis not present

## 2019-10-31 DIAGNOSIS — Z87891 Personal history of nicotine dependence: Secondary | ICD-10-CM | POA: Diagnosis not present

## 2019-10-31 DIAGNOSIS — Z91030 Bee allergy status: Secondary | ICD-10-CM

## 2019-10-31 DIAGNOSIS — E782 Mixed hyperlipidemia: Secondary | ICD-10-CM | POA: Diagnosis present

## 2019-10-31 DIAGNOSIS — Z833 Family history of diabetes mellitus: Secondary | ICD-10-CM | POA: Diagnosis not present

## 2019-10-31 DIAGNOSIS — N179 Acute kidney failure, unspecified: Secondary | ICD-10-CM | POA: Diagnosis not present

## 2019-10-31 DIAGNOSIS — G4733 Obstructive sleep apnea (adult) (pediatric): Secondary | ICD-10-CM | POA: Diagnosis present

## 2019-10-31 DIAGNOSIS — I252 Old myocardial infarction: Secondary | ICD-10-CM

## 2019-10-31 DIAGNOSIS — Z91048 Other nonmedicinal substance allergy status: Secondary | ICD-10-CM

## 2019-10-31 DIAGNOSIS — Z7984 Long term (current) use of oral hypoglycemic drugs: Secondary | ICD-10-CM | POA: Diagnosis not present

## 2019-10-31 DIAGNOSIS — Z823 Family history of stroke: Secondary | ICD-10-CM

## 2019-10-31 DIAGNOSIS — D62 Acute posthemorrhagic anemia: Secondary | ICD-10-CM | POA: Diagnosis not present

## 2019-10-31 DIAGNOSIS — Z91013 Allergy to seafood: Secondary | ICD-10-CM

## 2019-10-31 DIAGNOSIS — I249 Acute ischemic heart disease, unspecified: Secondary | ICD-10-CM

## 2019-10-31 DIAGNOSIS — I214 Non-ST elevation (NSTEMI) myocardial infarction: Principal | ICD-10-CM | POA: Diagnosis present

## 2019-10-31 DIAGNOSIS — E785 Hyperlipidemia, unspecified: Secondary | ICD-10-CM | POA: Diagnosis not present

## 2019-10-31 DIAGNOSIS — I25110 Atherosclerotic heart disease of native coronary artery with unstable angina pectoris: Secondary | ICD-10-CM | POA: Diagnosis present

## 2019-10-31 DIAGNOSIS — E1165 Type 2 diabetes mellitus with hyperglycemia: Secondary | ICD-10-CM | POA: Diagnosis present

## 2019-10-31 DIAGNOSIS — Z20822 Contact with and (suspected) exposure to covid-19: Secondary | ICD-10-CM | POA: Diagnosis present

## 2019-10-31 DIAGNOSIS — Z79899 Other long term (current) drug therapy: Secondary | ICD-10-CM

## 2019-10-31 DIAGNOSIS — E0865 Diabetes mellitus due to underlying condition with hyperglycemia: Secondary | ICD-10-CM | POA: Diagnosis not present

## 2019-10-31 DIAGNOSIS — K5900 Constipation, unspecified: Secondary | ICD-10-CM | POA: Diagnosis present

## 2019-10-31 DIAGNOSIS — I10 Essential (primary) hypertension: Secondary | ICD-10-CM | POA: Diagnosis not present

## 2019-10-31 DIAGNOSIS — Z951 Presence of aortocoronary bypass graft: Secondary | ICD-10-CM

## 2019-10-31 DIAGNOSIS — R079 Chest pain, unspecified: Secondary | ICD-10-CM | POA: Diagnosis not present

## 2019-10-31 DIAGNOSIS — D6489 Other specified anemias: Secondary | ICD-10-CM | POA: Diagnosis not present

## 2019-10-31 DIAGNOSIS — Z7982 Long term (current) use of aspirin: Secondary | ICD-10-CM | POA: Diagnosis not present

## 2019-10-31 DIAGNOSIS — Z955 Presence of coronary angioplasty implant and graft: Secondary | ICD-10-CM | POA: Diagnosis not present

## 2019-10-31 DIAGNOSIS — I119 Hypertensive heart disease without heart failure: Secondary | ICD-10-CM | POA: Diagnosis present

## 2019-10-31 DIAGNOSIS — E119 Type 2 diabetes mellitus without complications: Secondary | ICD-10-CM | POA: Diagnosis not present

## 2019-10-31 DIAGNOSIS — Z9889 Other specified postprocedural states: Secondary | ICD-10-CM

## 2019-10-31 DIAGNOSIS — I2510 Atherosclerotic heart disease of native coronary artery without angina pectoris: Secondary | ICD-10-CM | POA: Diagnosis not present

## 2019-10-31 LAB — CBC
HCT: 44.6 % (ref 39.0–52.0)
Hemoglobin: 14.4 g/dL (ref 13.0–17.0)
MCH: 27.2 pg (ref 26.0–34.0)
MCHC: 32.3 g/dL (ref 30.0–36.0)
MCV: 84.3 fL (ref 80.0–100.0)
Platelets: 271 10*3/uL (ref 150–400)
RBC: 5.29 MIL/uL (ref 4.22–5.81)
RDW: 12.5 % (ref 11.5–15.5)
WBC: 6 10*3/uL (ref 4.0–10.5)
nRBC: 0 % (ref 0.0–0.2)

## 2019-10-31 LAB — BASIC METABOLIC PANEL
Anion gap: 14 (ref 5–15)
BUN: 14 mg/dL (ref 6–20)
CO2: 19 mmol/L — ABNORMAL LOW (ref 22–32)
Calcium: 8.9 mg/dL (ref 8.9–10.3)
Chloride: 103 mmol/L (ref 98–111)
Creatinine, Ser: 1.12 mg/dL (ref 0.61–1.24)
GFR calc Af Amer: >60 mL/min (ref >60)
GFR calc non Af Amer: >60 mL/min (ref >60)
Glucose, Bld: 242 mg/dL — ABNORMAL HIGH (ref 70–99)
Potassium: 4.6 mmol/L (ref 3.5–5.1)
Sodium: 136 mmol/L (ref 135–145)

## 2019-10-31 LAB — HEPATIC FUNCTION PANEL
ALT: 30 U/L (ref 0–44)
AST: 28 U/L (ref 15–41)
Albumin: 4.2 g/dL (ref 3.5–5.0)
Alkaline Phosphatase: 73 U/L (ref 38–126)
Bilirubin, Direct: <0.1 mg/dL (ref 0.0–0.2)
Total Bilirubin: 0.7 mg/dL (ref 0.3–1.2)
Total Protein: 7.1 g/dL (ref 6.5–8.1)

## 2019-10-31 LAB — TROPONIN I (HIGH SENSITIVITY)
Troponin I (High Sensitivity): 273 ng/L (ref <18)
Troponin I (High Sensitivity): 422 ng/L (ref <18)

## 2019-10-31 LAB — BRAIN NATRIURETIC PEPTIDE: B Natriuretic Peptide: 87.7 pg/mL (ref 0.0–100.0)

## 2019-10-31 MED ORDER — ASPIRIN 81 MG PO CHEW: 324.0000 mg | CHEWABLE_TABLET | ORAL | Status: DC

## 2019-10-31 MED ORDER — ASPIRIN EC 81 MG PO TBEC
81.0000 mg | DELAYED_RELEASE_TABLET | Freq: Every day | ORAL | Status: DC
  Administered 2019-11-01 – 2019-11-02 (×2): 81 mg via ORAL
  Filled 2019-10-31 (×2): qty 1

## 2019-10-31 MED ORDER — NITROGLYCERIN 0.4 MG SL SUBL: 0.4000 mg | SUBLINGUAL_TABLET | SUBLINGUAL | Status: DC | PRN

## 2019-10-31 MED ORDER — ASPIRIN 300 MG RE SUPP: 300.0000 mg | RECTAL | Status: DC

## 2019-10-31 MED ORDER — CARVEDILOL 12.5 MG PO TABS
12.5000 mg | ORAL_TABLET | Freq: Two times a day (BID) | ORAL | Status: DC
  Administered 2019-10-31 – 2019-11-02 (×5): 12.5 mg via ORAL
  Filled 2019-10-31 (×5): qty 1

## 2019-10-31 MED ORDER — LISINOPRIL 20 MG PO TABS
40.0000 mg | ORAL_TABLET | Freq: Every day | ORAL | Status: DC
  Administered 2019-11-01: 40 mg via ORAL
  Filled 2019-10-31: qty 2

## 2019-10-31 MED ORDER — HEPARIN (PORCINE) 25000 UT/250ML-% IV SOLN
1200.0000 [IU]/h | INTRAVENOUS | Status: DC
  Administered 2019-10-31: 1250 [IU]/h via INTRAVENOUS
  Administered 2019-11-01: 1200 [IU]/h via INTRAVENOUS
  Filled 2019-10-31 (×2): qty 250

## 2019-10-31 MED ORDER — ASPIRIN 81 MG PO CHEW
324.0000 mg | CHEWABLE_TABLET | Freq: Once | ORAL | Status: AC
  Administered 2019-10-31: 324 mg via ORAL
  Filled 2019-10-31: qty 4

## 2019-10-31 MED ORDER — AMLODIPINE BESYLATE 10 MG PO TABS
10.0000 mg | ORAL_TABLET | Freq: Every day | ORAL | Status: DC
  Administered 2019-11-01 – 2019-11-02 (×2): 10 mg via ORAL
  Filled 2019-10-31 (×2): qty 1

## 2019-10-31 MED ORDER — HYDROCHLOROTHIAZIDE 25 MG PO TABS
25.0000 mg | ORAL_TABLET | Freq: Every day | ORAL | Status: DC
  Administered 2019-11-01 – 2019-11-02 (×2): 25 mg via ORAL
  Filled 2019-10-31: qty 1

## 2019-10-31 MED ORDER — SODIUM CHLORIDE 0.9% FLUSH
3.0000 mL | Freq: Once | INTRAVENOUS | Status: AC
  Administered 2019-10-31: 3 mL via INTRAVENOUS

## 2019-10-31 MED ORDER — METOPROLOL TARTRATE 25 MG PO TABS: 25.0000 mg | ORAL_TABLET | Freq: Two times a day (BID) | ORAL | Status: DC

## 2019-10-31 MED ORDER — ACETAMINOPHEN 325 MG PO TABS: 650.0000 mg | ORAL_TABLET | ORAL | Status: DC | PRN

## 2019-10-31 MED ORDER — EZETIMIBE 10 MG PO TABS
10.0000 mg | ORAL_TABLET | Freq: Every day | ORAL | Status: DC
  Administered 2019-11-01 – 2019-11-02 (×2): 10 mg via ORAL
  Filled 2019-10-31 (×2): qty 1

## 2019-10-31 MED ORDER — HEPARIN BOLUS VIA INFUSION
4000.0000 [IU] | Freq: Once | INTRAVENOUS | Status: AC
  Administered 2019-10-31: 4000 [IU] via INTRAVENOUS
  Filled 2019-10-31: qty 4000

## 2019-10-31 MED ORDER — INSULIN ASPART 100 UNIT/ML ~~LOC~~ SOLN
0.0000 [IU] | Freq: Three times a day (TID) | SUBCUTANEOUS | Status: DC
  Administered 2019-11-01 (×2): 2 [IU] via SUBCUTANEOUS
  Administered 2019-11-02: 3 [IU] via SUBCUTANEOUS

## 2019-10-31 MED ORDER — ONDANSETRON HCL 4 MG/2ML IJ SOLN: 4.0000 mg | Freq: Four times a day (QID) | INTRAMUSCULAR | Status: DC | PRN

## 2019-10-31 MED ORDER — ROSUVASTATIN CALCIUM 20 MG PO TABS
40.0000 mg | ORAL_TABLET | Freq: Every day | ORAL | Status: DC
  Administered 2019-11-01 – 2019-11-02 (×2): 40 mg via ORAL
  Filled 2019-10-31 (×2): qty 2

## 2019-10-31 NOTE — ED Notes (Signed)
Pt stating he is currently only having minimum pain, and wants to hold off on the nitro for now. Informed to let this RN know if he changes his mind and needs something for pain

## 2019-10-31 NOTE — H&P (Signed)
Cardiology History & Physical    Patient ID: [name] MRN: [id number]; DOB: [birth year]   Admission date: 10/31/2019  Primary Care Provider: Nolene Ebbs, MD Primary Cardiologist: Pixie Casino, MD  Primary Electrophysiologist:  None   Chief Complaint:  Chest pain  Patient Profile:   John May is a 54 y.o. male with a history of CAD s/p PCI to RCA who presents with chest pain.  History of Present Illness:   Patient reports intermittent central chest pain since Tuesday.  The episodes happen randomly, and it feels like someone is sitting on his chest.  This is similar to prior anginal pain that he has had.  The pain got worse today, so he came to the ED.  Currently his pain has resolved, but he feels very tired.  VS 145/94, HR 88, SpO2 98 Cr 1.1, Troponin 273 ECG with sinus tach. Diffuse TWI and mild STE (not meeting criteria) in V1-V3.  Old inferior infract.  Resolution of STE on repeat ECG.  CAD history  01/2010 MI, failed PCI of occluded RCA. Medical Mgmt.  TTE EF 55-60. 05/2012 Cath for chest pain. 99 mid RCA; DES to RCA . 11/2012 Cath for + stress.  LM luminal irreg, LAD 50 prox, 50 and 60 mid, 70 distal, D1 with 80 distal, Ramus with 70 mid, OM1 with 50, 50 distal left circ; RCA with patent stent, 90 PDA; medical therapy recommended 10/2014 Nuclear EF 53, mild distal anterior ischemia, small previous infarct.   Heart Pathway Score:      Past Medical History:  Diagnosis Date  . CAD (coronary artery disease)    a. 01/2010 - MI with unsuccessful PCI of RCA. b. s/p PCI/DES to mid RCA 05/2012. c. Cath 11/2012 for abnl stress test - diffuse branch and distal vessel CAD for med rx.    . Diabetes mellitus ([place])    a. A1c 6.9 in 11/2012.  Marland Kitchen Hyperlipidemia   . Hypertension   . OSA on CPAP   . Paresthesias    a. bilat upper & lower extremities - intermittently present since 2011.    Past Surgical History:  Procedure Laterality Date  . CORONARY ANGIOPLASTY WITH STENT  PLACEMENT  05/14/2012   "1"  . LEFT HEART CATHETERIZATION WITH CORONARY ANGIOGRAM N/A 11/25/2012   Procedure: LEFT HEART CATHETERIZATION WITH CORONARY ANGIOGRAM;  Surgeon: Larey Dresser, MD;  Location: [facility] CATH LAB;  Service: Cardiovascular;  Laterality: N/A;  . PATELLAR TENDON REPAIR  1990's   left  . PERCUTANEOUS CORONARY STENT INTERVENTION (PCI-S) N/A 05/14/2012   Procedure: PERCUTANEOUS CORONARY STENT INTERVENTION (PCI-S);  Surgeon: Burnell Blanks, MD;  Location: [facility] CATH LAB;  Service: Cardiovascular;  Laterality: N/A;     Medications Prior to Admission: Prior to Admission medications   Medication Sig Start Date End Date Taking? Authorizing Provider  amLODipine (NORVASC) 10 MG tablet TAKE 1 TABLET BY MOUTH EVERY DAY Patient taking differently: Take 10 mg by mouth daily.  01/19/19  Yes Lelon Perla, MD  aspirin EC 81 MG tablet Take 81 mg by mouth daily.   Yes [provider]  ezetimibe (ZETIA) 10 MG tablet TAKE 1 TABLET BY MOUTH EVERY DAY Patient taking differently: Take 10 mg by mouth daily.  11/25/18  Yes Lelon Perla, MD  hydrochlorothiazide (HYDRODIURIL) 25 MG tablet Take 25 mg by mouth daily. 03/31/18  Yes [provider]  JARDIANCE 25 MG TABS tablet Take 25 mg by mouth daily. 04/01/18  Yes [provider]  lisinopril (ZESTRIL) 40 MG tablet TAKE 1 TABLET BY MOUTH EVERY DAY Patient taking differently: Take 40 mg by mouth daily.  05/28/19  Yes Lewayne Bunting, MD  metFORMIN (GLUCOPHAGE) 1000 MG tablet Take 1,000 mg by mouth 2 (two) times daily. 03/17/18  Yes [provider]  metoprolol succinate (TOPROL XL) 25 MG 24 hr tablet Take 1 tablet (25 mg total) by mouth at bedtime. 04/09/19  Yes Lewayne Bunting, MD  pioglitazone (ACTOS) 30 MG tablet Take 30 mg by mouth daily. 06/02/17  Yes [provider]  rosuvastatin (CRESTOR) 40 MG tablet TAKE 1 TABLET BY MOUTH EVERY DAY Patient taking differently: Take 40 mg by mouth daily.  10/19/19   Yes Lewayne Bunting, MD  sildenafil (REVATIO) 20 MG tablet Take 60-100 mg by mouth daily as needed. 10/14/19  Yes [provider]  Vitamin D, Ergocalciferol, (DRISDOL) 50000 units CAPS capsule Take 50,000 Units by mouth once a week. Sundays 05/30/17  Yes [provider]  nitroGLYCERIN (NITROSTAT) 0.4 MG SL tablet Place 1 tablet (0.4 mg total) under the tongue every 5 (five) minutes as needed for chest pain (up to 3 doses). Patient not taking: Reported on 10/31/2019 12/31/17   Lewayne Bunting, MD     Allergies:    Allergies  Allergen Reactions  . Bee Venom Swelling  . Food Allergy Formula Swelling    "Crab in stuffing; I eat shrimp and I don't have problems"  . Crab [Shellfish Allergy]   . Nutritional Supplements Swelling    unknown    Social History:   Social History   Socioeconomic History  . Marital status: Divorced    Spouse name: Not on file  . Number of children: 2  . Years of education: Not on file  . Highest education level: Not on file  Occupational History    Employer: UNEMPLOYED  Tobacco Use  . Smoking status: Former Smoker    Packs/day: 0.50    Years: 18.00    Pack years: 9.00    Types: Cigarettes    Quit date: 11/26/2003    Years since quitting: 15.9  . Smokeless tobacco: Never Used  Substance and Sexual Activity  . Alcohol use: No    Alcohol/week: 0.0 standard drinks  . Drug use: No  . Sexual activity: Yes  Other Topics Concern  . Not on file  Social History Narrative  . Not on file   Social Determinants of Health   Financial Resource Strain:   . Difficulty of Paying Living Expenses: Not on file  Food Insecurity:   . Worried About Programme researcher, broadcasting/film/video in the Last Year: Not on file  . Ran Out of Food in the Last Year: Not on file  Transportation Needs:   . Lack of Transportation (Medical): Not on file  . Lack of Transportation (Non-Medical): Not on file  Physical Activity:   . Days of Exercise per Week: Not on file  . Minutes of  Exercise per Session: Not on file  Stress:   . Feeling of Stress : Not on file  Social Connections:   . Frequency of Communication with Friends and Family: Not on file  . Frequency of Social Gatherings with Friends and Family: Not on file  . Attends Religious Services: Not on file  . Active Member of Clubs or Organizations: Not on file  . Attends Banker Meetings: Not on file  . Marital Status: Not on file  Intimate Partner Violence:   .  Fear of Current or Ex-Partner: Not on file  . Emotionally Abused: Not on file  . Physically Abused: Not on file  . Sexually Abused: Not on file     Family History:   The patient's family history includes Cancer in his father and mother; Diabetes in his mother; Hypertension in his mother; Stroke in his mother.    ROS:  Please see the history of present illness.  All other ROS reviewed and negative.     Physical Exam/Data:   Vitals:   10/31/19 2100 10/31/19 2115 10/31/19 2130 10/31/19 2145  BP: (!) 153/97 (!) 145/94 (!) 160/105 (!) 174/101  Pulse: 82 88 86 88  Resp: 19 19 20 19   Temp:      TempSrc:      SpO2: 98% 98% 99% 98%   No intake or output data in the 24 hours ending 10/31/19 2202 Last 3 Weights 10/08/2019 04/09/2019 09/29/2018  Weight (lbs) 215 lb 210 lb 208 lb 9.6 oz  Weight (kg) 97.523 kg 95.255 kg 94.62 kg     There is no height or weight on file to calculate BMI.  Wt Readings from Last 3 Encounters:  10/08/19 97.5 kg  04/09/19 95.3 kg  09/29/18 94.6 kg    Physical Exam: General: Well developed, well nourished, in no acute distress. Head: Normocephalic, atraumatic, sclera non-icteric, no xanthomas, nares are without discharge.  Neck: Negative for carotid bruits. JVD not elevated. Lungs: Clear bilaterally to auscultation without wheezes, rales, or rhonchi. Breathing is unlabored. Heart: RRR with S1 S2. No murmurs, rubs, or gallops appreciated. Abdomen: Soft, non-tender, non-distended with normoactive bowel  sounds. No hepatomegaly. No rebound/guarding. No obvious abdominal masses. Msk:  Strength and tone appear normal for age. Extremities: No clubbing or cyanosis. No edema.  Distal pedal pulses are 2+ and equal bilaterally. Neuro: Alert and oriented X 3. No focal deficit. No facial asymmetry. Moves all extremities spontaneously. Psych:  Responds to questions appropriately with a normal affect.    EKG:  The ECG that was done: see HPI  Relevant CV Studies: See HPI  Laboratory Data:  High Sensitivity Troponin:   Recent Labs  Lab 10/31/19 2007  TROPONINIHS 273*      Cardiac EnzymesNo results for input(s): TROPONINI in the last 168 hours. No results for input(s): TROPIPOC in the last 168 hours.  Chemistry Recent Labs  Lab 10/31/19 2007  NA 136  K 4.6  CL 103  CO2 19*  GLUCOSE 242*  BUN 14  CREATININE 1.12  CALCIUM 8.9  GFRNONAA >60  GFRAA >60  ANIONGAP 14    Recent Labs  Lab 10/31/19 2015  PROT 7.1  ALBUMIN 4.2  AST 28  ALT 30  ALKPHOS 73  BILITOT 0.7   Hematology Recent Labs  Lab 10/31/19 2007  WBC 6.0  RBC 5.29  HGB 14.4  HCT 44.6  MCV 84.3  MCH 27.2  MCHC 32.3  RDW 12.5  PLT 271   BNP Recent Labs  Lab 10/31/19 2029  BNP 87.7    DDimer No results for input(s): DDIMER in the last 168 hours.   Radiology/Studies:  DG Chest 2 View  Result Date: 10/31/2019 CLINICAL DATA:  Chest pain EXAM: CHEST - 2 VIEW COMPARISON:  Chest radiograph dated 11/23/2012. FINDINGS: The heart size and mediastinal contours are within normal limits. Both lungs are clear. The visualized skeletal structures are unremarkable. IMPRESSION: No active cardiopulmonary disease. Electronically Signed   By: 11/25/2012 M.D.   On: 10/31/2019 20:24  Assessment and Plan   1. NSTEMI Symptoms, ECG changes, and elevated troponin consistent with type I NSTEMI.  Currently pain free.  Plan: -- coronary angiography Monday 2/22 -- echocardiogram -- aspirin, heparin, beta blocker -  continue rosuvastatin, zetia  2. HTN Continue amlodipine, metoprolol, lisinopril  3. DM -- hold metformin, jardiance.  BG checks daily   Severity of Illness: The appropriate patient status for this patient is INPATIENT. Inpatient status is judged to be reasonable and necessary in order to provide the required intensity of service to ensure the patient's safety. The patient's presenting symptoms, physical exam findings, and initial radiographic and laboratory data in the context of their chronic comorbidities is felt to place them at high risk for further clinical deterioration. Furthermore, it is not anticipated that the patient will be medically stable for discharge from the hospital within 2 midnights of admission. The following factors support the patient status of inpatient.   " The patient's presenting symptoms include chest pain. " The worrisome physical exam findings include none. " The initial radiographic and laboratory data are worrisome because of nstemi. " The chronic co-morbidities include htn.   * I certify that at the point of admission it is my clinical judgment that the patient will require inpatient hospital care spanning beyond 2 midnights from the point of admission due to high intensity of service, high risk for further deterioration and high frequency of surveillance required.*    For questions or updates, please contact [facility] Please consult [email or web address] for contact info under      Signed, Shaunessy Dobratz S, MD 10/31/2019, 10:02 PM

## 2019-10-31 NOTE — Progress Notes (Signed)
ANTICOAGULATION CONSULT NOTE - Initial Consult  Pharmacy Consult for IV heparin Indication: chest pain/ACS  Allergies  Allergen Reactions  . Bee Venom Swelling  . Food Allergy Formula Swelling    "Crab in stuffing; I eat shrimp and I don't have problems"  . Crab [Shellfish Allergy]   . Nutritional Supplements Swelling    unknown    Patient Measurements:  Most recent wt in Epic = 97.7 kg (10/08/19) 5'7" Heparin Dosing Weight: 88 kg  Vital Signs: Temp: 98 F (36.7 C) (02/20 2004) Temp Source: Oral (02/20 2004) BP: 145/94 (02/20 2115) Pulse Rate: 88 (02/20 2115)  Labs: Recent Labs    10/31/19 2007  HGB 14.4  HCT 44.6  PLT 271  CREATININE 1.12  TROPONINIHS 273*    CrCl cannot be calculated (Unknown ideal weight.).   Medical History: Past Medical History:  Diagnosis Date  . CAD (coronary artery disease)    a. 01/2010 - MI with unsuccessful PCI of RCA. b. s/p PCI/DES to mid RCA 05/2012. c. Cath 11/2012 for abnl stress test - diffuse branch and distal vessel CAD for med rx.    . Diabetes mellitus (HCC)    a. A1c 6.9 in 11/2012.  Marland Kitchen Hyperlipidemia   . Hypertension   . OSA on CPAP   . Paresthesias    a. bilat upper & lower extremities - intermittently present since 2011.    Medications:  Infusions:    Assessment: 54 yo male with known CAD admitted with chest pain. Pharmacy asked to begin anticoagulation with IV heparin.  No anticoagulation noted PTA.  Goal of Therapy:  Heparin level 0.3-0.7 units/ml Monitor platelets by anticoagulation protocol: Yes   Plan:  1. Start IV heparin with bolus of 4000 units x 1. 2. Then start heparin gtt at 1250 units/hr 3. Check heparin level 6 hrs after gtt started. 4. Daily heparin level and CBC.  Jenetta Downer, [facility] Clinical Pharmacist Phone [phone]  10/31/2019 9:55 PM

## 2019-10-31 NOTE — ED Provider Notes (Signed)
[facility] EMERGENCY DEPARTMENT Provider Note   CSN: [id number] Arrival date & time: 10/31/19  1931     History Chief Complaint  Patient presents with  . Chest Pain    John May is a 54 y.o. male presenting for evaluation of chest pain.  Patient states for the past 4 days he has been having chest heaviness and pressure.  This began several hours after eating hamburger.  He states pain is present mostly in the evenings and at night.  It is constant, nothing makes it better or worse.  He states this feels similar to how he felt when he had a heart attack in 2013.  He is status post stent placement.  He denies worsening shortness of breath, nausea, vomiting, or diaphoresis.  He denies recent fevers, chills, cough, or abdominal pain.  Patient states he has been feeling very gassy, he has been burping a lot but not passing much gas.  He had 1 small bowel movement yesterday, but normally has a bowel movement every day.  He has not had a regular bowel movement since Monday.  He has no previous abdominal surgeries.  He has a history of diabetes, hypertension, hyperlipidemia, sleep apnea.  HPI     Past Medical History:  Diagnosis Date  . CAD (coronary artery disease)    a. 01/2010 - MI with unsuccessful PCI of RCA. b. s/p PCI/DES to mid RCA 05/2012. c. Cath 11/2012 for abnl stress test - diffuse branch and distal vessel CAD for med rx.    . Diabetes mellitus (HCC)    a. A1c 6.9 in 11/2012.  Marland Kitchen Hyperlipidemia   . Hypertension   . OSA on CPAP   . Paresthesias    a. bilat upper & lower extremities - intermittently present since 2011.    Patient Active Problem List   Diagnosis Date Noted  . NSTEMI (non-ST elevated myocardial infarction) (HCC) 10/31/2019  . Abnormal stress test 11/25/2012  . Diabetes mellitus, new onset (HCC) 11/25/2012  . OSA on CPAP 11/25/2012  . Precordial pain 11/25/2012  . Coronary artery disease involving native coronary artery of native heart  without angina pectoris   . Paresthesias 06/03/2012  . Mixed hyperlipidemia   . Essential hypertension   . MI (myocardial infarction) (HCC)   . ASCVD (arteriosclerotic cardiovascular disease) 11/26/2011    Past Surgical History:  Procedure Laterality Date  . CORONARY ANGIOPLASTY WITH STENT PLACEMENT  05/14/2012   "1"  . LEFT HEART CATHETERIZATION WITH CORONARY ANGIOGRAM N/A 11/25/2012   Procedure: LEFT HEART CATHETERIZATION WITH CORONARY ANGIOGRAM;  Surgeon: Laurey Morale, MD;  Location: [facility] CATH LAB;  Service: Cardiovascular;  Laterality: N/A;  . PATELLAR TENDON REPAIR  1990's   left  . PERCUTANEOUS CORONARY STENT INTERVENTION (PCI-S) N/A 05/14/2012   Procedure: PERCUTANEOUS CORONARY STENT INTERVENTION (PCI-S);  Surgeon: Kathleene Hazel, MD;  Location: [facility] CATH LAB;  Service: Cardiovascular;  Laterality: N/A;       Family History  Problem Relation Age of Onset  . Hypertension Mother   . Cancer Mother   . Diabetes Mother   . Stroke Mother   . Cancer Father     Social History   Tobacco Use  . Smoking status: Former Smoker    Packs/day: 0.50    Years: 18.00    Pack years: 9.00    Types: Cigarettes    Quit date: 11/26/2003    Years since quitting: 15.9  . Smokeless tobacco: Never Used  Substance Use Topics  .  Alcohol use: No    Alcohol/week: 0.0 standard drinks  . Drug use: No    Home Medications Prior to Admission medications   Medication Sig Start Date End Date Taking? Authorizing Provider  amLODipine (NORVASC) 10 MG tablet TAKE 1 TABLET BY MOUTH EVERY DAY Patient taking differently: Take 10 mg by mouth daily.  01/19/19  Yes Lelon Perla, MD  aspirin EC 81 MG tablet Take 81 mg by mouth daily.   Yes [provider]  ezetimibe (ZETIA) 10 MG tablet TAKE 1 TABLET BY MOUTH EVERY DAY Patient taking differently: Take 10 mg by mouth daily.  11/25/18  Yes Lelon Perla, MD  hydrochlorothiazide (HYDRODIURIL) 25 MG tablet Take 25 mg by mouth daily. 03/31/18   Yes [provider]  JARDIANCE 25 MG TABS tablet Take 25 mg by mouth daily. 04/01/18  Yes [provider]  lisinopril (ZESTRIL) 40 MG tablet TAKE 1 TABLET BY MOUTH EVERY DAY Patient taking differently: Take 40 mg by mouth daily.  05/28/19  Yes Lelon Perla, MD  metFORMIN (GLUCOPHAGE) 1000 MG tablet Take 1,000 mg by mouth 2 (two) times daily. 03/17/18  Yes [provider]  metoprolol succinate (TOPROL XL) 25 MG 24 hr tablet Take 1 tablet (25 mg total) by mouth at bedtime. 04/09/19  Yes Lelon Perla, MD  pioglitazone (ACTOS) 30 MG tablet Take 30 mg by mouth daily. 06/02/17  Yes [provider]  rosuvastatin (CRESTOR) 40 MG tablet TAKE 1 TABLET BY MOUTH EVERY DAY Patient taking differently: Take 40 mg by mouth daily.  10/19/19  Yes Lelon Perla, MD  sildenafil (REVATIO) 20 MG tablet Take 60-100 mg by mouth daily as needed. 10/14/19  Yes [provider]  Vitamin D, Ergocalciferol, (DRISDOL) 50000 units CAPS capsule Take 50,000 Units by mouth once a week. Sundays 05/30/17  Yes [provider]  nitroGLYCERIN (NITROSTAT) 0.4 MG SL tablet Place 1 tablet (0.4 mg total) under the tongue every 5 (five) minutes as needed for chest pain (up to 3 doses). Patient not taking: Reported on 10/31/2019 12/31/17   Lelon Perla, MD    Allergies    Bee venom, Food allergy formula, Crab [shellfish allergy], and Nutritional supplements  Review of Systems   Review of Systems  Cardiovascular:       Chest pressure  Gastrointestinal: Positive for constipation.  All other systems reviewed and are negative.   Physical Exam Updated Vital Signs BP (!) 157/95 (BP Location: Right Arm)   Pulse 69   Temp 98.6 F (37 C) (Oral)   Resp 15   Ht 5\' 7"  (1.702 m)   Wt 90.8 kg   SpO2 98%   BMI 31.35 kg/m   Physical Exam Vitals and nursing note reviewed.  Constitutional:      General: He is not in acute distress.    Appearance: He is well-developed.      Comments: Appears nontoxic  HENT:     Head: Normocephalic and atraumatic.  Eyes:     Extraocular Movements: Extraocular movements intact.     Conjunctiva/sclera: Conjunctivae normal.     Pupils: Pupils are equal, round, and reactive to light.  Cardiovascular:     Rate and Rhythm: Normal rate and regular rhythm.  Pulmonary:     Effort: Pulmonary effort is normal. No respiratory distress.     Breath sounds: Normal breath sounds. No wheezing.  Abdominal:     General: There is no distension.     Palpations: Abdomen is soft.  There is no mass.     Tenderness: There is no abdominal tenderness. There is no guarding or rebound.     Comments: No ttp of the abd.   Musculoskeletal:        General: Normal range of motion.     Cervical back: Normal range of motion and neck supple.     Right lower leg: No edema.     Left lower leg: No edema.     Comments: No leg pain or swelling  Skin:    General: Skin is warm and dry.     Capillary Refill: Capillary refill takes less than 2 seconds.  Neurological:     Mental Status: He is alert and oriented to person, place, and time.     ED Results / Procedures / Treatments   Labs (all labs ordered are listed, but only abnormal results are displayed) Labs Reviewed  BASIC METABOLIC PANEL - Abnormal; Notable for the following components:      Result Value   CO2 19 (*)    Glucose, Bld 242 (*)    All other components within normal limits  TROPONIN I (HIGH SENSITIVITY) - Abnormal; Notable for the following components:   Troponin I (High Sensitivity) 273 (*)    All other components within normal limits  TROPONIN I (HIGH SENSITIVITY) - Abnormal; Notable for the following components:   Troponin I (High Sensitivity) 422 (*)    All other components within normal limits  SARS CORONAVIRUS 2 (TAT 6-24 HRS)  MRSA PCR SCREENING  CBC  BRAIN NATRIURETIC PEPTIDE  HEPATIC FUNCTION PANEL  HIV ANTIBODY (ROUTINE TESTING W REFLEX)  BASIC METABOLIC PANEL  CBC    HEPARIN LEVEL (UNFRACTIONATED)  HEMOGLOBIN A1C  LIPID PANEL    EKG EKG Interpretation  Date/Time:  Saturday October 31 2019 19:33:09 EST Ventricular Rate:  104 PR Interval:  146 QRS Duration: 100 QT Interval:  332 QTC Calculation: 436 R Axis:   19 Text Interpretation: Sinus tachycardia Left ventricular hypertrophy with repolarization abnormality ( R in aVL , Romhilt-Estes ) Inferior infarct , age undetermined Anterolateral infarct , age undetermined Abnormal ECG Confirmed by Kennis Carina [phone]) on 10/31/2019 8:19:08 PM   Radiology DG Chest 2 View  Result Date: 10/31/2019 CLINICAL DATA:  Chest pain EXAM: CHEST - 2 VIEW COMPARISON:  Chest radiograph dated 11/23/2012. FINDINGS: The heart size and mediastinal contours are within normal limits. Both lungs are clear. The visualized skeletal structures are unremarkable. IMPRESSION: No active cardiopulmonary disease. Electronically Signed   By: Romona Curls M.D.   On: 10/31/2019 20:24    Procedures .Critical Care Performed by: Alveria Apley, PA-C Authorized by: Alveria Apley, PA-C   Critical care provider statement:    Critical care time (minutes):  45   Critical care time was exclusive of:  Separately billable procedures and treating other patients and teaching time   Critical care was necessary to treat or prevent imminent or life-threatening deterioration of the following conditions:  Cardiac failure   Critical care was time spent personally by me on the following activities:  Blood draw for specimens, development of treatment plan with patient or surrogate, evaluation of patient's response to treatment, examination of patient, obtaining history from patient or surrogate, ordering and performing treatments and interventions, ordering and review of laboratory studies, ordering and review of radiographic studies, pulse oximetry, re-evaluation of patient's condition and review of old charts   I assumed direction of critical  care for this patient from another provider in my specialty:  no   Comments:     Pt with ACS, requiring IV heparin and admission to the hospital.    (including critical care time)  Medications Ordered in ED Medications  aspirin EC tablet 81 mg (has no administration in time range)  nitroGLYCERIN (NITROSTAT) SL tablet 0.4 mg (has no administration in time range)  acetaminophen (TYLENOL) tablet 650 mg (has no administration in time range)  ondansetron (ZOFRAN) injection 4 mg (has no administration in time range)  heparin ADULT infusion 100 units/mL (25000 units/256mL sodium chloride 0.45%) (1,250 Units/hr Intravenous New Bag/Given 10/31/19 2306)  amLODipine (NORVASC) tablet 10 mg (has no administration in time range)  rosuvastatin (CRESTOR) tablet 40 mg (has no administration in time range)  hydrochlorothiazide (HYDRODIURIL) tablet 25 mg (has no administration in time range)  ezetimibe (ZETIA) tablet 10 mg (has no administration in time range)  lisinopril (ZESTRIL) tablet 40 mg (has no administration in time range)  insulin aspart (novoLOG) injection 0-15 Units (has no administration in time range)  carvedilol (COREG) tablet 12.5 mg (12.5 mg Oral Given 10/31/19 2319)  sodium chloride flush (NS) 0.9 % injection 3 mL (3 mLs Intravenous Given 10/31/19 2032)  aspirin chewable tablet 324 mg (324 mg Oral Given 10/31/19 2031)  heparin bolus via infusion 4,000 Units (4,000 Units Intravenous Bolus from Bag 10/31/19 2303)    ED Course  I have reviewed the triage vital signs and the nursing notes.  Pertinent labs & imaging results that were available during my care of the patient were reviewed by me and considered in my medical decision making (see chart for details).    MDM Rules/Calculators/A&P                      Patient presenting for evaluation of chest pressure.  Physical exam shows patient appears nontoxic.  However, I am concerned about his symptoms as this is how he presented with his last  heart attack.  He has multiple comorbidities and history of previous stent.  EKG obtained from triage shows ischemic changes.  It does not show a STEMI, however changes from previous.  Will obtain labs including troponin, give aspirin and nitro and reassess.  Labs interpreted by me.  Troponin elevated at 270, will consult with cardiology.  Mild hyperglycemia in the mid 200s, otherwise labs are reassuring.  BNP is negative.  Electrolytes stable.  No leukocytosis.  Discussed with Dr. Electa Sniff from cardiology who will evaluate and admit the patient.  Recommends starting heparin drip.  Final Clinical Impression(s) / ED Diagnoses Final diagnoses:  ACS (acute coronary syndrome) [facility])    Rx / DC Orders ED Discharge Orders    None       Alveria Apley, PA-C 10/31/19 2323    Sabas Sous, MD 11/01/19 [phone]

## 2019-10-31 NOTE — ED Triage Notes (Signed)
Intermittent chest pain "heaviness" since Tuesday morning, associated with SOB. Hx of stents, MI.

## 2019-11-01 ENCOUNTER — Inpatient Hospital Stay (HOSPITAL_COMMUNITY): Payer: Medicare Other

## 2019-11-01 ENCOUNTER — Encounter (HOSPITAL_COMMUNITY): Payer: Self-pay | Admitting: Cardiology

## 2019-11-01 DIAGNOSIS — I214 Non-ST elevation (NSTEMI) myocardial infarction: Principal | ICD-10-CM

## 2019-11-01 LAB — BASIC METABOLIC PANEL
Anion gap: 11 (ref 5–15)
BUN: 14 mg/dL (ref 6–20)
CO2: 26 mmol/L (ref 22–32)
Calcium: 8.7 mg/dL — ABNORMAL LOW (ref 8.9–10.3)
Chloride: 106 mmol/L (ref 98–111)
Creatinine, Ser: 1.14 mg/dL (ref 0.61–1.24)
GFR calc Af Amer: >60 mL/min (ref >60)
GFR calc non Af Amer: >60 mL/min (ref >60)
Glucose, Bld: 143 mg/dL — ABNORMAL HIGH (ref 70–99)
Potassium: 4.6 mmol/L (ref 3.5–5.1)
Sodium: 143 mmol/L (ref 135–145)

## 2019-11-01 LAB — HEPARIN LEVEL (UNFRACTIONATED)
Heparin Unfractionated: 0.6 IU/mL (ref 0.30–0.70)
Heparin Unfractionated: 0.66 IU/mL (ref 0.30–0.70)

## 2019-11-01 LAB — CBC
HCT: 43.7 % (ref 39.0–52.0)
Hemoglobin: 14.2 g/dL (ref 13.0–17.0)
MCH: 27.3 pg (ref 26.0–34.0)
MCHC: 32.5 g/dL (ref 30.0–36.0)
MCV: 84 fL (ref 80.0–100.0)
Platelets: 256 10*3/uL (ref 150–400)
RBC: 5.2 MIL/uL (ref 4.22–5.81)
RDW: 12.5 % (ref 11.5–15.5)
WBC: 7.5 10*3/uL (ref 4.0–10.5)
nRBC: 0 % (ref 0.0–0.2)

## 2019-11-01 LAB — HIV ANTIBODY (ROUTINE TESTING W REFLEX): HIV Screen 4th Generation wRfx: NONREACTIVE — AB

## 2019-11-01 LAB — GLUCOSE, CAPILLARY
Glucose-Capillary: 148 mg/dL — ABNORMAL HIGH (ref 70–99)
Glucose-Capillary: 121 mg/dL — ABNORMAL HIGH (ref 70–99)
Glucose-Capillary: 114 mg/dL — ABNORMAL HIGH (ref 70–99)
Glucose-Capillary: 180 mg/dL — ABNORMAL HIGH (ref 70–99)

## 2019-11-01 LAB — ECHOCARDIOGRAM COMPLETE
Height: 67 in
Weight: 3202.84 oz

## 2019-11-01 LAB — LIPID PANEL
Cholesterol: 192 mg/dL (ref 0–200)
HDL: 27 mg/dL — ABNORMAL LOW (ref >40)
LDL Cholesterol: 115 mg/dL — ABNORMAL HIGH (ref 0–99)
Total CHOL/HDL Ratio: 7.1 RATIO
Triglycerides: 252 mg/dL — ABNORMAL HIGH (ref <150)
VLDL: 50 mg/dL — ABNORMAL HIGH (ref 0–40)

## 2019-11-01 LAB — HEMOGLOBIN A1C
Hgb A1c MFr Bld: 7.2 % — ABNORMAL HIGH (ref 4.8–5.6)
Mean Plasma Glucose: 159.94 mg/dL

## 2019-11-01 LAB — MRSA PCR SCREENING: MRSA by PCR: NEGATIVE

## 2019-11-01 LAB — SARS CORONAVIRUS 2 (TAT 6-24 HRS): SARS Coronavirus 2: NEGATIVE

## 2019-11-01 MED ORDER — SODIUM CHLORIDE 0.9% FLUSH: 3.0000 mL | INTRAVENOUS | Status: DC | PRN

## 2019-11-01 MED ORDER — ASPIRIN 81 MG PO CHEW
81.0000 mg | CHEWABLE_TABLET | ORAL | Status: AC
  Administered 2019-11-02: 81 mg via ORAL
  Filled 2019-11-01: qty 1

## 2019-11-01 MED ORDER — SODIUM CHLORIDE 0.9 % IV SOLN: 250.0000 mL | INTRAVENOUS | Status: DC | PRN

## 2019-11-01 MED ORDER — SODIUM CHLORIDE 0.9% FLUSH
3.0000 mL | Freq: Two times a day (BID) | INTRAVENOUS | Status: DC
  Administered 2019-11-01: 3 mL via INTRAVENOUS

## 2019-11-01 MED ORDER — SODIUM CHLORIDE 0.9 % WEIGHT BASED INFUSION: 1.0000 mL/kg/h | INTRAVENOUS | Status: DC

## 2019-11-01 MED ORDER — SODIUM CHLORIDE 0.9 % WEIGHT BASED INFUSION
3.0000 mL/kg/h | INTRAVENOUS | Status: DC
  Administered 2019-11-02: 3 mL/kg/h via INTRAVENOUS

## 2019-11-01 NOTE — Progress Notes (Signed)
ANTICOAGULATION CONSULT NOTE  Pharmacy Consult for IV heparin Indication: chest pain/ACS  Allergies  Allergen Reactions  . Bee Venom Swelling  . Food Allergy Formula Swelling    "Crab in stuffing; I eat shrimp and I don't have problems"  . Crab [Shellfish Allergy]   . Nutritional Supplements Swelling    unknown    Patient Measurements: Height: 5\' 7"  (170.2 cm) Weight: 200 lb 2.8 oz (90.8 kg) IBW/kg (Calculated) : 66.1Most recent wt in Epic = 97.7 kg (10/08/19) 5'7" Heparin Dosing Weight: 88 kg  Vital Signs: Temp: 97.8 F (36.6 C) (02/21 0730) Temp Source: Oral (02/21 0730) BP: 136/95 (02/21 0730) Pulse Rate: 74 (02/21 0730)  Labs: Recent Labs    10/31/19 2007 10/31/19 2127 11/01/19 0502  HGB 14.4  --  14.2  HCT 44.6  --  43.7  PLT 271  --  256  HEPARINUNFRC  --   --  0.60  CREATININE 1.12  --  1.14  TROPONINIHS 273* 422*  --     Estimated Creatinine Clearance: 82.4 mL/min (by C-G formula based on SCr of 1.14 mg/dL).   Medical History: Past Medical History:  Diagnosis Date  . CAD (coronary artery disease)    a. 01/2010 - MI with unsuccessful PCI of RCA. b. s/p PCI/DES to mid RCA 05/2012. c. Cath 11/2012 for abnl stress test - diffuse branch and distal vessel CAD for med rx.    . Diabetes mellitus (HCC)    a. A1c 6.9 in 11/2012.  12/2012 Hyperlipidemia   . Hypertension   . OSA on CPAP   . Paresthesias    a. bilat upper & lower extremities - intermittently present since 2011.    Medications:  Infusions:  . heparin 1,250 Units/hr (10/31/19 2306)    Assessment: 54 yo male with known CAD admitted with chest pain. Pharmacy asked to begin anticoagulation with IV heparin. No anticoagulation noted PTA.  Follow up heparin level still within range but trending up to upper end of goal. Will adjust rate down. No bleeding issues noted.   Goal of Therapy:  Heparin level 0.3-0.7 units/ml Monitor platelets by anticoagulation protocol: Yes   Plan:  Reduce heparin to 1200  units/hr Daily heparin level and CBC  44 PharmD., BCPS Clinical Pharmacist 11/01/2019 8:16 AM

## 2019-11-01 NOTE — Progress Notes (Signed)
ANTICOAGULATION CONSULT NOTE - Follow Up Consult  Pharmacy Consult for heparin Indication: NSTEMI  Labs: Recent Labs    10/31/19 2007 10/31/19 2127 11/01/19 0502  HGB 14.4  --  14.2  HCT 44.6  --  43.7  PLT 271  --  256  HEPARINUNFRC  --   --  0.60  CREATININE 1.12  --   --   TROPONINIHS 273* 422*  --     Assessment/Plan:  54yo male therapeutic on heparin with initial dosing for NSTEMI. Will continue gtt at current rate and confirm stable with additional level.   Vernard Gambles, PharmD, BCPS  11/01/2019,5:46 AM

## 2019-11-01 NOTE — Progress Notes (Signed)
  Echocardiogram 2D Echocardiogram has been performed.  John May F 11/01/2019, 3:55 PM

## 2019-11-01 NOTE — Plan of Care (Signed)

## 2019-11-01 NOTE — Plan of Care (Signed)

## 2019-11-01 NOTE — Progress Notes (Addendum)
Progress Note  Patient Name: John May Date of Encounter: 11/01/2019  Primary Cardiologist: Chrystie Nose, MD  Subjective   No chest pain or breathlessness at rest.  No palpitations or abdominal pain.  Inpatient Medications    Scheduled Meds: . amLODipine  10 mg Oral Daily  . aspirin EC  81 mg Oral Daily  . carvedilol  12.5 mg Oral BID WC  . ezetimibe  10 mg Oral Daily  . hydrochlorothiazide  25 mg Oral Daily  . insulin aspart  0-15 Units Subcutaneous TID WC  . lisinopril  40 mg Oral Daily  . rosuvastatin  40 mg Oral q1800   Continuous Infusions: . heparin 1,250 Units/hr (10/31/19 2306)   PRN Meds: acetaminophen, nitroGLYCERIN, ondansetron (ZOFRAN) IV   Vital Signs    Vitals:   10/31/19 2228 10/31/19 2254 11/01/19 0355 11/01/19 0730  BP: (!) 160/85 (!) 157/95 115/82 (!) 136/95  Pulse:  69 79 74  Resp:  15 15 14   Temp:  98.6 F (37 C) 98.6 F (37 C) 97.8 F (36.6 C)  TempSrc:  Oral Oral Oral  SpO2:  98% 97% 100%  Weight:  90.8 kg    Height:  5\' 7"  (1.702 m)      Intake/Output Summary (Last 24 hours) at 11/01/2019 0858 Last data filed at 11/01/2019 0648 Gross per 24 hour  Intake 336.21 ml  Output --  Net 336.21 ml   Filed Weights   10/31/19 2254  Weight: 90.8 kg    Telemetry    Sinus rhythm.  Personally reviewed.  ECG    An ECG dated 11/01/2019 was personally reviewed today and demonstrated:  Sinus rhythm with age-indeterminate inferior infarct pattern and anterolateral ST-T wave abnormalities consistent with ischemia.  Physical Exam   GEN: No acute distress.   Neck: No JVD. Cardiac: RRR, no murmur, rub, or gallop.  Respiratory: Nonlabored. Clear to auscultation bilaterally. GI: Soft, nontender, bowel sounds present. MS: No edema; No deformity. Neuro:  Nonfocal. Psych: Alert and oriented x 3. Normal affect.  Labs    Chemistry Recent Labs  Lab 10/31/19 2007 10/31/19 2015 11/01/19 0502  NA 136  --  143  K 4.6  --  4.6  CL 103  --   106  CO2 19*  --  26  GLUCOSE 242*  --  143*  BUN 14  --  14  CREATININE 1.12  --  1.14  CALCIUM 8.9  --  8.7*  PROT  --  7.1  --   ALBUMIN  --  4.2  --   AST  --  28  --   ALT  --  30  --   ALKPHOS  --  73  --   BILITOT  --  0.7  --   GFRNONAA >60  --  >60  GFRAA >60  --  >60  ANIONGAP 14  --  11     Hematology Recent Labs  Lab 10/31/19 2007 11/01/19 0502  WBC 6.0 7.5  RBC 5.29 5.20  HGB 14.4 14.2  HCT 44.6 43.7  MCV 84.3 84.0  MCH 27.2 27.3  MCHC 32.3 32.5  RDW 12.5 12.5  PLT 271 256    Cardiac Enzymes Recent Labs  Lab 10/31/19 2007 10/31/19 2127  TROPONINIHS 273* 422*    BNP Recent Labs  Lab 10/31/19 2029  BNP 87.7     Radiology    DG Chest 2 View  Result Date: 10/31/2019 CLINICAL DATA:  Chest pain EXAM: CHEST -  2 VIEW COMPARISON:  Chest radiograph dated 11/23/2012. FINDINGS: The heart size and mediastinal contours are within normal limits. Both lungs are clear. The visualized skeletal structures are unremarkable. IMPRESSION: No active cardiopulmonary disease. Electronically Signed   By: Zerita Boers M.D.   On: 10/31/2019 20:24    Cardiac Studies   Echocardiogram pending.  Patient Profile     54 y.o. male with a history of CAD status post DES to the RCA in 2013 with diffuse residual disease has been managed medically, type 2 diabetes mellitus, hyperlipidemia, hypertension, and OSA on CPAP.  He presents with unstable angina and enzymatic evidence of NSTEMI.  Assessment & Plan    1.  Unstable angina with enzymatic evidence of NSTEMI.  High-sensitivity troponin I up to 422, no active chest pain at this time.  ECG shows age-indeterminate inferior infarct pattern and also anterolateral ST-T wave changes suggestive of ischemia.  He is currently on aspirin, Coreg, Norvasc, Zetia, HCTZ, lisinopril, Crestor, and IV heparin.  2.  CAD status post DES to the RCA in 2013 with diffuse residual disease that is been managed medically so far.  3.  Mixed  hyperlipidemia, currently on Crestor and Zetia.  Recent LDL 115.  May need to be considered for PCSK9 inhibitor at outpatient follow-up.  He reports compliance with his medications.  4.  Essential hypertension, blood pressures ranging 1 15-1 40 recently.  No changes for now on current regimen.    5.  Type 2 diabetes mellitus. Glucophage currently on hold in anticipation of angiography.  He is otherwise on Jardiance and Actos as an outpatient.  Hemoglobin A1c 7.2%.  Diagnostic cardiac catheterization with eye toward revascularization options discussed with patient, will place on scheduled for tomorrow. Risks and benefits discussed.  Patient does mention that he had difficulty tolerating radial access with last procedure, may need to consider femoral access. Continue with current medications.  Follow-up CBC, BMET, and ECG in a.m.  Signed, Rozann Lesches, MD  11/01/2019, 8:58 AM

## 2019-11-02 ENCOUNTER — Encounter (HOSPITAL_COMMUNITY)
Admission: EM | Disposition: A | Discharge status: Home / Self Care | Payer: Self-pay | Source: Home / Self Care | Attending: Cardiothoracic Surgery

## 2019-11-02 ENCOUNTER — Inpatient Hospital Stay (HOSPITAL_COMMUNITY): Payer: Medicare Other

## 2019-11-02 DIAGNOSIS — E785 Hyperlipidemia, unspecified: Secondary | ICD-10-CM

## 2019-11-02 DIAGNOSIS — I2510 Atherosclerotic heart disease of native coronary artery without angina pectoris: Secondary | ICD-10-CM

## 2019-11-02 DIAGNOSIS — I10 Essential (primary) hypertension: Secondary | ICD-10-CM

## 2019-11-02 DIAGNOSIS — I214 Non-ST elevation (NSTEMI) myocardial infarction: Secondary | ICD-10-CM

## 2019-11-02 DIAGNOSIS — I25110 Atherosclerotic heart disease of native coronary artery with unstable angina pectoris: Secondary | ICD-10-CM

## 2019-11-02 DIAGNOSIS — N179 Acute kidney failure, unspecified: Secondary | ICD-10-CM

## 2019-11-02 DIAGNOSIS — E0865 Diabetes mellitus due to underlying condition with hyperglycemia: Secondary | ICD-10-CM

## 2019-11-02 HISTORY — PX: LEFT HEART CATH AND CORONARY ANGIOGRAPHY: CATH118249

## 2019-11-02 LAB — CBC
HCT: 44.1 % (ref 39.0–52.0)
Hemoglobin: 14.4 g/dL (ref 13.0–17.0)
MCH: 27 pg (ref 26.0–34.0)
MCHC: 32.7 g/dL (ref 30.0–36.0)
MCV: 82.7 fL (ref 80.0–100.0)
Platelets: 264 10*3/uL (ref 150–400)
RBC: 5.33 MIL/uL (ref 4.22–5.81)
RDW: 12.4 % (ref 11.5–15.5)
WBC: 6.5 10*3/uL (ref 4.0–10.5)
nRBC: 0 % (ref 0.0–0.2)

## 2019-11-02 LAB — TYPE AND SCREEN
ABO/RH(D): A POS
Antibody Screen: NEGATIVE

## 2019-11-02 LAB — BASIC METABOLIC PANEL
Anion gap: 11 (ref 5–15)
BUN: 16 mg/dL (ref 6–20)
CO2: 26 mmol/L (ref 22–32)
Calcium: 8.9 mg/dL (ref 8.9–10.3)
Chloride: 99 mmol/L (ref 98–111)
Creatinine, Ser: 1.47 mg/dL — ABNORMAL HIGH (ref 0.61–1.24)
GFR calc Af Amer: >60 mL/min (ref >60)
GFR calc non Af Amer: 54 mL/min — ABNORMAL LOW (ref >60)
Glucose, Bld: 157 mg/dL — ABNORMAL HIGH (ref 70–99)
Potassium: 3.6 mmol/L (ref 3.5–5.1)
Sodium: 136 mmol/L (ref 135–145)

## 2019-11-02 LAB — GLUCOSE, CAPILLARY
Glucose-Capillary: 155 mg/dL — ABNORMAL HIGH (ref 70–99)
Glucose-Capillary: 116 mg/dL — ABNORMAL HIGH (ref 70–99)
Glucose-Capillary: 192 mg/dL — ABNORMAL HIGH (ref 70–99)

## 2019-11-02 LAB — ABO/RH: ABO/RH(D): A POS

## 2019-11-02 LAB — HEPARIN LEVEL (UNFRACTIONATED): Heparin Unfractionated: 0.63 IU/mL (ref 0.30–0.70)

## 2019-11-02 SURGERY — LEFT HEART CATH AND CORONARY ANGIOGRAPHY
Anesthesia: LOCAL

## 2019-11-02 MED ORDER — TRANEXAMIC ACID (OHS) BOLUS VIA INFUSION
15.0000 mg/kg | INTRAVENOUS | Status: AC
  Administered 2019-11-03: 1351.5 mg via INTRAVENOUS
  Filled 2019-11-02: qty 1352

## 2019-11-02 MED ORDER — PNEUMOCOCCAL VAC POLYVALENT 25 MCG/0.5ML IJ INJ
0.5000 mL | INJECTION | INTRAMUSCULAR | Status: DC
  Filled 2019-11-02 (×2): qty 0.5

## 2019-11-02 MED ORDER — CHLORHEXIDINE GLUCONATE CLOTH 2 % EX PADS
6.0000 | MEDICATED_PAD | Freq: Once | CUTANEOUS | Status: AC
  Administered 2019-11-03: 6 via TOPICAL

## 2019-11-02 MED ORDER — MIDAZOLAM HCL 2 MG/2ML IJ SOLN
INTRAMUSCULAR | Status: AC
  Filled 2019-11-02: qty 2

## 2019-11-02 MED ORDER — SODIUM CHLORIDE 0.9 % IV SOLN: INTRAVENOUS | Status: DC

## 2019-11-02 MED ORDER — LIDOCAINE HCL (PF) 1 % IJ SOLN
INTRAMUSCULAR | Status: DC | PRN
  Administered 2019-11-02: 15 mL

## 2019-11-02 MED ORDER — LIDOCAINE HCL (PF) 1 % IJ SOLN
INTRAMUSCULAR | Status: AC
  Filled 2019-11-02: qty 30

## 2019-11-02 MED ORDER — SODIUM BICARBONATE-DEXTROSE 150-5 MEQ/L-% IV SOLN
150.0000 meq | INTRAVENOUS | Status: DC
  Administered 2019-11-02: 20:00:00 150 meq via INTRAVENOUS
  Filled 2019-11-02 (×3): qty 1000

## 2019-11-02 MED ORDER — MAGNESIUM SULFATE 50 % IJ SOLN
40.0000 meq | INTRAMUSCULAR | Status: DC
  Filled 2019-11-02: qty 9.85

## 2019-11-02 MED ORDER — CHLORHEXIDINE GLUCONATE CLOTH 2 % EX PADS
6.0000 | MEDICATED_PAD | Freq: Once | CUTANEOUS | Status: AC
  Administered 2019-11-02: 22:00:00 6 via TOPICAL

## 2019-11-02 MED ORDER — SODIUM CHLORIDE 0.9 % IV SOLN
1.5000 g | INTRAVENOUS | Status: AC
  Administered 2019-11-03: 1.5 g via INTRAVENOUS
  Filled 2019-11-02: qty 1.5

## 2019-11-02 MED ORDER — FENTANYL CITRATE (PF) 100 MCG/2ML IJ SOLN
INTRAMUSCULAR | Status: DC | PRN
  Administered 2019-11-02: 50 ug via INTRAVENOUS

## 2019-11-02 MED ORDER — BISACODYL 5 MG PO TBEC
5.0000 mg | DELAYED_RELEASE_TABLET | Freq: Once | ORAL | Status: AC
  Administered 2019-11-02: 5 mg via ORAL
  Filled 2019-11-02: qty 1

## 2019-11-02 MED ORDER — SODIUM CHLORIDE 0.9 % IV SOLN: 250.0000 mL | INTRAVENOUS | Status: DC | PRN

## 2019-11-02 MED ORDER — TRANEXAMIC ACID 1000 MG/10ML IV SOLN
1.5000 mg/kg/h | INTRAVENOUS | Status: AC
  Administered 2019-11-03: 1.5 mg/kg/h via INTRAVENOUS
  Filled 2019-11-02: qty 25

## 2019-11-02 MED ORDER — NOREPINEPHRINE 4 MG/250ML-% IV SOLN
0.0000 ug/min | INTRAVENOUS | Status: DC
  Filled 2019-11-02: qty 250

## 2019-11-02 MED ORDER — DEXMEDETOMIDINE HCL IN NACL 400 MCG/100ML IV SOLN
0.1000 ug/kg/h | INTRAVENOUS | Status: AC
  Administered 2019-11-03: .5 ug/kg/h via INTRAVENOUS
  Filled 2019-11-02: qty 100

## 2019-11-02 MED ORDER — SODIUM CHLORIDE 0.9% FLUSH: 3.0000 mL | INTRAVENOUS | Status: DC | PRN

## 2019-11-02 MED ORDER — CHLORHEXIDINE GLUCONATE 0.12 % MT SOLN
15.0000 mL | Freq: Once | OROMUCOSAL | Status: AC
  Administered 2019-11-03: 15 mL via OROMUCOSAL
  Filled 2019-11-02: qty 15

## 2019-11-02 MED ORDER — METHYLPREDNISOLONE SODIUM SUCC 125 MG IJ SOLR
60.0000 mg | Freq: Two times a day (BID) | INTRAMUSCULAR | Status: DC
  Administered 2019-11-02 – 2019-11-03 (×2): 60 mg via INTRAVENOUS
  Filled 2019-11-02 (×2): qty 2

## 2019-11-02 MED ORDER — TRANEXAMIC ACID (OHS) PUMP PRIME SOLUTION
2.0000 mg/kg | INTRAVENOUS | Status: DC
  Filled 2019-11-02: qty 1.8

## 2019-11-02 MED ORDER — SODIUM CHLORIDE 0.9% FLUSH
3.0000 mL | Freq: Two times a day (BID) | INTRAVENOUS | Status: DC
  Administered 2019-11-02: 3 mL via INTRAVENOUS

## 2019-11-02 MED ORDER — TEMAZEPAM 15 MG PO CAPS: 15.0000 mg | ORAL_CAPSULE | Freq: Once | ORAL | Status: DC | PRN

## 2019-11-02 MED ORDER — IOHEXOL 350 MG/ML SOLN
INTRAVENOUS | Status: DC | PRN
  Administered 2019-11-02: 45 mL

## 2019-11-02 MED ORDER — METOPROLOL TARTRATE 12.5 MG HALF TABLET
12.5000 mg | ORAL_TABLET | Freq: Once | ORAL | Status: AC
  Administered 2019-11-03: 12.5 mg via ORAL
  Filled 2019-11-02: qty 1

## 2019-11-02 MED ORDER — MIDAZOLAM HCL 2 MG/2ML IJ SOLN
INTRAMUSCULAR | Status: DC | PRN
  Administered 2019-11-02: 2 mg via INTRAVENOUS

## 2019-11-02 MED ORDER — VANCOMYCIN HCL 1500 MG/300ML IV SOLN
1500.0000 mg | INTRAVENOUS | Status: AC
  Administered 2019-11-03: 1500 mg via INTRAVENOUS
  Filled 2019-11-02: qty 300

## 2019-11-02 MED ORDER — FENTANYL CITRATE (PF) 100 MCG/2ML IJ SOLN
INTRAMUSCULAR | Status: AC
  Filled 2019-11-02: qty 2

## 2019-11-02 MED ORDER — SODIUM CHLORIDE 0.9 % IV SOLN
INTRAVENOUS | Status: DC
  Filled 2019-11-02: qty 30

## 2019-11-02 MED ORDER — NITROGLYCERIN IN D5W 200-5 MCG/ML-% IV SOLN
2.0000 ug/min | INTRAVENOUS | Status: DC
  Filled 2019-11-02: qty 250

## 2019-11-02 MED ORDER — HEPARIN (PORCINE) 25000 UT/250ML-% IV SOLN
1200.0000 [IU]/h | INTRAVENOUS | Status: DC
  Administered 2019-11-02: 1200 [IU]/h via INTRAVENOUS
  Filled 2019-11-02: qty 250

## 2019-11-02 MED ORDER — HYDRALAZINE HCL 20 MG/ML IJ SOLN: 10.0000 mg | INTRAMUSCULAR | Status: AC | PRN

## 2019-11-02 MED ORDER — INSULIN REGULAR(HUMAN) IN NACL 100-0.9 UT/100ML-% IV SOLN
INTRAVENOUS | Status: AC
  Administered 2019-11-03: 5.5 [IU]/h via INTRAVENOUS
  Filled 2019-11-02: qty 100

## 2019-11-02 MED ORDER — LABETALOL HCL 5 MG/ML IV SOLN: 10.0000 mg | INTRAVENOUS | Status: AC | PRN

## 2019-11-02 MED ORDER — PLASMA-LYTE 148 IV SOLN
INTRAVENOUS | Status: AC
  Administered 2019-11-03: 500 mL
  Filled 2019-11-02: qty 2.5

## 2019-11-02 MED ORDER — HEPARIN (PORCINE) IN NACL 1000-0.9 UT/500ML-% IV SOLN
INTRAVENOUS | Status: AC
  Filled 2019-11-02: qty 1000

## 2019-11-02 MED ORDER — MILRINONE LACTATE IN DEXTROSE 20-5 MG/100ML-% IV SOLN
0.3000 ug/kg/min | INTRAVENOUS | Status: DC
  Filled 2019-11-02: qty 100

## 2019-11-02 MED ORDER — POTASSIUM CHLORIDE 2 MEQ/ML IV SOLN
80.0000 meq | INTRAVENOUS | Status: DC
  Filled 2019-11-02: qty 40

## 2019-11-02 MED ORDER — SODIUM CHLORIDE 0.9 % IV SOLN
750.0000 mg | INTRAVENOUS | Status: DC
  Filled 2019-11-02: qty 750

## 2019-11-02 MED ORDER — EPINEPHRINE HCL 5 MG/250ML IV SOLN IN NS
0.0000 ug/min | INTRAVENOUS | Status: DC
  Filled 2019-11-02: qty 250

## 2019-11-02 MED ORDER — PHENYLEPHRINE HCL-NACL 20-0.9 MG/250ML-% IV SOLN
30.0000 ug/min | INTRAVENOUS | Status: AC
  Administered 2019-11-03: 20 ug/min via INTRAVENOUS
  Filled 2019-11-02: qty 250

## 2019-11-02 MED ORDER — HEPARIN (PORCINE) IN NACL 1000-0.9 UT/500ML-% IV SOLN
INTRAVENOUS | Status: DC | PRN
  Administered 2019-11-02 (×2): 500 mL

## 2019-11-02 SURGICAL SUPPLY — 13 items
CATH INFINITI 5FR MULTPACK ANG (CATHETERS) ×1 IMPLANT
CLOSURE MYNX CONTROL 5F (Vascular Products) ×1 IMPLANT
GLIDESHEATH SLEND SS 6F .021 (SHEATH) ×1 IMPLANT
GUIDEWIRE INQWIRE 1.5J.035X260 (WIRE) IMPLANT
INQWIRE 1.5J .035X260CM (WIRE) ×2
KIT HEART LEFT (KITS) ×2 IMPLANT
KIT MICROPUNCTURE NIT STIFF (SHEATH) ×1 IMPLANT
PACK CARDIAC CATHETERIZATION (CUSTOM PROCEDURE TRAY) ×2 IMPLANT
SHEATH PINNACLE 5F 10CM (SHEATH) ×1 IMPLANT
SHEATH PROBE COVER 6X72 (BAG) ×1 IMPLANT
TRANSDUCER W/STOPCOCK (MISCELLANEOUS) ×2 IMPLANT
TUBING CIL FLEX 10 FLL-RA (TUBING) ×2 IMPLANT
WIRE EMERALD 3MM-J .035X150CM (WIRE) ×1 IMPLANT

## 2019-11-02 NOTE — Interval H&P Note (Signed)
History and Physical Interval Note:  11/02/2019 10:24 AM  John May  has presented today for surgery, with the diagnosis of NSTEMI.  The various methods of treatment have been discussed with the patient and family. After consideration of risks, benefits and other options for treatment, the patient has consented to  Procedure(s): LEFT HEART CATH AND CORONARY ANGIOGRAPHY (N/A) as a surgical intervention.  The patient's history has been reviewed, patient examined, no change in status, stable for surgery.  I have reviewed the patient's chart and labs.  Questions were answered to the patient's satisfaction.    Cath Lab Visit (complete for each Cath Lab visit)  Clinical Evaluation Leading to the Procedure:   ACS: Yes.    Non-ACS:    Anginal Classification: CCS III  Anti-ischemic medical therapy: Minimal Therapy (1 class of medications)  Non-Invasive Test Results: No non-invasive testing performed  Prior CABG: No previous CABG        Verne Carrow

## 2019-11-02 NOTE — Progress Notes (Signed)
TCTS consulted for CABG evaluation. °

## 2019-11-02 NOTE — Progress Notes (Signed)
Progress Note  Patient Name: John May Date of Encounter: 11/02/2019  Primary Cardiologist: Kirk Ruths, MD  Subjective   No further chest pain. No questions about cath today.  Inpatient Medications    Scheduled Meds: . amLODipine  10 mg Oral Daily  . aspirin EC  81 mg Oral Daily  . carvedilol  12.5 mg Oral BID WC  . ezetimibe  10 mg Oral Daily  . hydrochlorothiazide  25 mg Oral Daily  . insulin aspart  0-15 Units Subcutaneous TID WC  . lisinopril  40 mg Oral Daily  . [START ON 11/03/2019] pneumococcal 23 valent vaccine  0.5 mL Intramuscular Tomorrow-1000  . rosuvastatin  40 mg Oral q1800  . sodium chloride flush  3 mL Intravenous Q12H   Continuous Infusions: . sodium chloride    . sodium chloride 1 mL/kg/hr (11/02/19 0542)  . heparin 1,200 Units/hr (11/01/19 1441)   PRN Meds: sodium chloride, acetaminophen, nitroGLYCERIN, ondansetron (ZOFRAN) IV, sodium chloride flush   Vital Signs    Vitals:   11/01/19 2010 11/01/19 2346 11/02/19 0300 11/02/19 0739  BP: 113/90 114/71 117/76 (!) 136/92  Pulse: 80 73 76 66  Resp: 17 15 15 19   Temp: 98.2 F (36.8 C) 98.5 F (36.9 C) 97.9 F (36.6 C) 98 F (36.7 C)  TempSrc: Oral Oral Oral Oral  SpO2: 99% 100% 100%   Weight:      Height:        Intake/Output Summary (Last 24 hours) at 11/02/2019 0837 Last data filed at 11/02/2019 0700 Gross per 24 hour  Intake 783.4 ml  Output 1350 ml  Net -566.6 ml   Filed Weights   10/31/19 2254 11/01/19 1928  Weight: 90.8 kg 90.1 kg    Telemetry    Sinus rhythm, occasional PVC's -  Personally reviewed.  ECG    Sinus rhythm with inferior and anterolateral deep TWI's - personally reviewed  Physical Exam   GEN: No acute distress.   Neck: No JVD. Cardiac: RRR, no murmur, rub, or gallop.  Respiratory: Nonlabored. Clear to auscultation bilaterally. GI: Soft, nontender, bowel sounds present. MS: No edema; No deformity. Neuro:  Nonfocal. Psych: Alert and oriented x 3.  Normal affect.  Labs    Chemistry Recent Labs  Lab 10/31/19 2007 10/31/19 2015 11/01/19 0502 11/02/19 0132  NA 136  --  143 136  K 4.6  --  4.6 3.6  CL 103  --  106 99  CO2 19*  --  26 26  GLUCOSE 242*  --  143* 157*  BUN 14  --  14 16  CREATININE 1.12  --  1.14 1.47*  CALCIUM 8.9  --  8.7* 8.9  PROT  --  7.1  --   --   ALBUMIN  --  4.2  --   --   AST  --  28  --   --   ALT  --  30  --   --   ALKPHOS  --  73  --   --   BILITOT  --  0.7  --   --   GFRNONAA >60  --  >60 54*  GFRAA >60  --  >60 >60  ANIONGAP 14  --  11 11     Hematology Recent Labs  Lab 10/31/19 2007 11/01/19 0502 11/02/19 0132  WBC 6.0 7.5 6.5  RBC 5.29 5.20 5.33  HGB 14.4 14.2 14.4  HCT 44.6 43.7 44.1  MCV 84.3 84.0 82.7  MCH 27.2  27.3 27.0  MCHC 32.3 32.5 32.7  RDW 12.5 12.5 12.4  PLT 271 256 264    Cardiac Enzymes Recent Labs  Lab 10/31/19 2007 10/31/19 2127  TROPONINIHS 273* 422*    BNP Recent Labs  Lab 10/31/19 2029  BNP 87.7     Radiology    DG Chest 2 View  Result Date: 10/31/2019 CLINICAL DATA:  Chest pain EXAM: CHEST - 2 VIEW COMPARISON:  Chest radiograph dated 11/23/2012. FINDINGS: The heart size and mediastinal contours are within normal limits. Both lungs are clear. The visualized skeletal structures are unremarkable. IMPRESSION: No active cardiopulmonary disease. Electronically Signed   By: Tyler  Litton M.D.   On: 10/31/2019 20:24   ECHOCARDIOGRAM COMPLETE  Result Date: 11/01/2019    ECHOCARDIOGRAM REPORT   Patient Name:   John May Date of Exam: 11/01/2019 Medical Rec #:  [id number]   Height:       67.0 in Accession #:    [id number]  Weight:       200.2 lb Date of Birth:  11/16/1965   BSA:          2.023 m Patient Age:    53 years    BP:           130/88 mmHg Patient Gender: M           HR:           85 bpm. Exam Location:  Inpatient Procedure: 2D Echo, Color Doppler and Cardiac Doppler Indications:    Acute myocardial Infarction  History:        Patient has prior  history of Echocardiogram examinations, most                 recent 01/10/2010. Signs/Symptoms:Chest Pain.  Sonographer:    Rachel Lane RDCS Referring Phys: [id number] ADAM S BARNETT IMPRESSIONS  1. Left ventricular ejection fraction, by estimation, is 55%. The left ventricle has normal function. The left ventricle demonstrates regional wall motion abnormalities (see scoring diagram/findings for description). There is mild left ventricular hypertrophy. Left ventricular diastolic parameters are indeterminate.  2. Right ventricular systolic function is normal. The right ventricular size is normal. Tricuspid regurgitation signal is inadequate for assessing PA pressure.  3. The mitral valve is grossly normal. Trivial mitral valve regurgitation.  4. The aortic valve is tricuspid. Aortic valve regurgitation is not visualized. FINDINGS  Left Ventricle: Left ventricular ejection fraction, by estimation, is 55%. The left ventricle has normal function. The left ventricle demonstrates regional wall motion abnormalities. The left ventricular internal cavity size was normal in size. There is  mild left ventricular hypertrophy. Left ventricular diastolic parameters are indeterminate.  LV Wall Scoring: The apical septal segment, apical inferior segment, and apex are hypokinetic. The entire anterior wall, antero-lateral wall, inferior wall, apical lateral segment, mid inferoseptal segment, and basal inferoseptal segment are normal. Right Ventricle: The right ventricular size is normal. No increase in right ventricular wall thickness. Right ventricular systolic function is normal. Tricuspid regurgitation signal is inadequate for assessing PA pressure. Left Atrium: Left atrial size was normal in size. Right Atrium: Right atrial size was normal in size. Pericardium: There is no evidence of pericardial effusion. Mitral Valve: The mitral valve is grossly normal. Trivial mitral valve regurgitation. Tricuspid Valve: The tricuspid valve is  grossly normal. Tricuspid valve regurgitation is trivial. Aortic Valve: The aortic valve is tricuspid. Aortic valve regurgitation is not visualized. Mild aortic valve annular calcification. Pulmonic Valve: The pulmonic valve was grossly normal. Pulmonic valve   regurgitation is not visualized. Aorta: The aortic root was not well visualized. Venous: The inferior vena cava was not well visualized. IAS/Shunts: No atrial level shunt detected by color flow Doppler.  LEFT VENTRICLE PLAX 2D LVIDd:         3.70 cm LVIDs:         2.50 cm LV PW:         1.10 cm LV IVS:        1.10 cm LVOT diam:     1.90 cm LV SV:         42.53 ml LV SV Index:   21.02 LVOT Area:     2.84 cm  LV Volumes (MOD) LV vol d, MOD A2C: 84.2 ml LV vol d, MOD A4C: 71.5 ml LV vol s, MOD A2C: 50.0 ml LV vol s, MOD A4C: 30.8 ml LV SV MOD A2C:     34.2 ml LV SV MOD A4C:     71.5 ml LV SV MOD BP:      38.8 ml RIGHT VENTRICLE RV Basal diam:  3.00 cm RV S prime:     10.30 cm/s TAPSE (M-mode): 2.5 cm LEFT ATRIUM             Index       RIGHT ATRIUM           Index LA diam:        3.30 cm 1.63 cm/m  RA Area:     11.80 cm LA Vol (A2C):   51.6 ml 25.51 ml/m RA Volume:   23.10 ml  11.42 ml/m LA Vol (A4C):   44.8 ml 22.15 ml/m LA Biplane Vol: 50.5 ml 24.96 ml/m  AORTIC VALVE LVOT Vmax:   80.90 cm/s LVOT Vmean:  46.700 cm/s LVOT VTI:    0.150 m  AORTA Ao Root diam: 2.90 cm Ao Asc diam:  2.40 cm MITRAL VALVE MV Area (PHT): 3.27 cm    SHUNTS MV Decel Time: 232 msec    Systemic VTI:  0.15 m MV E velocity: 42.40 cm/s  Systemic Diam: 1.90 cm MV A velocity: 69.40 cm/s MV E/A ratio:  0.61 Nona Dell MD Electronically signed by Nona Dell MD Signature Date/Time: 11/01/2019/4:09:48 PM    Final     Cardiac Studies   Echocardiogram pending.  Patient Profile     54 y.o. male with a history of CAD status post DES to the RCA in 2013 with diffuse residual disease has been managed medically, type 2 diabetes mellitus, hyperlipidemia, hypertension, and OSA on  CPAP.  He presents with unstable angina and enzymatic evidence of NSTEMI.  Assessment & Plan    1.  Unstable angina with enzymatic evidence of NSTEMI.  High-sensitivity troponin I up to 422, no active chest pain at this time.  ECG shows age-indeterminate inferior infarct pattern and also anterolateral ST-T wave changes suggestive of ischemia.  He is currently on aspirin, Coreg, Norvasc, Zetia, HCTZ, lisinopril, Crestor, and IV heparin. Echo shows LVEF 55% with anteroapical WMA, suspect LAD infarct. He says he had pain for 3 days prior to presentation, ?missed STEMI. Plan LHC today.  2.  CAD status post DES to the RCA in 2013 with diffuse residual disease that is been managed medically so far.  3.  Mixed hyperlipidemia, currently on Crestor and Zetia.  Recent LDL 115.  May need to be considered for PCSK9 inhibitor at outpatient follow-up.  He reports compliance with his medications.  4.  Essential hypertension, blood pressures ranging 115-140 recently.  No changes for now on current regimen.    5.  Type 2 diabetes mellitus. Glucophage currently on hold in anticipation of angiography.  He is otherwise on Jardiance and Actos as an outpatient.  Hemoglobin A1c 7.2%.  6. AKI - creatinine up to 1.4 today - on lisinopril 40 mg, will hold for anticipated cath today. Continue IV hydration and minimize dye load.  Chrystie Nose, MD, [facility], FACP  [place]  [facility]  Medical Director of the [facility] Lipid Disorders &  Cardiovascular Risk Reduction Clinic Diplomate of the American Board of Clinical Lipidology Attending Cardiologist  Direct Dial: [phone]  Fax: [phone]  Website:  [email or web address]  Chrystie Nose, MD  11/02/2019, 8:37 AM

## 2019-11-02 NOTE — H&P (View-Only) (Signed)
Progress Note  Patient Name: John May Date of Encounter: 11/02/2019  Primary Cardiologist: Kirk Ruths, MD  Subjective   No further chest pain. No questions about cath today.  Inpatient Medications    Scheduled Meds: . amLODipine  10 mg Oral Daily  . aspirin EC  81 mg Oral Daily  . carvedilol  12.5 mg Oral BID WC  . ezetimibe  10 mg Oral Daily  . hydrochlorothiazide  25 mg Oral Daily  . insulin aspart  0-15 Units Subcutaneous TID WC  . lisinopril  40 mg Oral Daily  . [START ON 11/03/2019] pneumococcal 23 valent vaccine  0.5 mL Intramuscular Tomorrow-1000  . rosuvastatin  40 mg Oral q1800  . sodium chloride flush  3 mL Intravenous Q12H   Continuous Infusions: . sodium chloride    . sodium chloride 1 mL/kg/hr (11/02/19 0542)  . heparin 1,200 Units/hr (11/01/19 1441)   PRN Meds: sodium chloride, acetaminophen, nitroGLYCERIN, ondansetron (ZOFRAN) IV, sodium chloride flush   Vital Signs    Vitals:   11/01/19 2010 11/01/19 2346 11/02/19 0300 11/02/19 0739  BP: 113/90 114/71 117/76 (!) 136/92  Pulse: 80 73 76 66  Resp: 17 15 15 19   Temp: 98.2 F (36.8 C) 98.5 F (36.9 C) 97.9 F (36.6 C) 98 F (36.7 C)  TempSrc: Oral Oral Oral Oral  SpO2: 99% 100% 100%   Weight:      Height:        Intake/Output Summary (Last 24 hours) at 11/02/2019 0837 Last data filed at 11/02/2019 0700 Gross per 24 hour  Intake 783.4 ml  Output 1350 ml  Net -566.6 ml   Filed Weights   10/31/19 2254 11/01/19 1928  Weight: 90.8 kg 90.1 kg    Telemetry    Sinus rhythm, occasional PVC's -  Personally reviewed.  ECG    Sinus rhythm with inferior and anterolateral deep TWI's - personally reviewed  Physical Exam   GEN: No acute distress.   Neck: No JVD. Cardiac: RRR, no murmur, rub, or gallop.  Respiratory: Nonlabored. Clear to auscultation bilaterally. GI: Soft, nontender, bowel sounds present. MS: No edema; No deformity. Neuro:  Nonfocal. Psych: Alert and oriented x 3.  Normal affect.  Labs    Chemistry Recent Labs  Lab 10/31/19 2007 10/31/19 2015 11/01/19 0502 11/02/19 0132  NA 136  --  143 136  K 4.6  --  4.6 3.6  CL 103  --  106 99  CO2 19*  --  26 26  GLUCOSE 242*  --  143* 157*  BUN 14  --  14 16  CREATININE 1.12  --  1.14 1.47*  CALCIUM 8.9  --  8.7* 8.9  PROT  --  7.1  --   --   ALBUMIN  --  4.2  --   --   AST  --  28  --   --   ALT  --  30  --   --   ALKPHOS  --  73  --   --   BILITOT  --  0.7  --   --   GFRNONAA >60  --  >60 54*  GFRAA >60  --  >60 >60  ANIONGAP 14  --  11 11     Hematology Recent Labs  Lab 10/31/19 2007 11/01/19 0502 11/02/19 0132  WBC 6.0 7.5 6.5  RBC 5.29 5.20 5.33  HGB 14.4 14.2 14.4  HCT 44.6 43.7 44.1  MCV 84.3 84.0 82.7  MCH 27.2  27.3 27.0  MCHC 32.3 32.5 32.7  RDW 12.5 12.5 12.4  PLT 271 256 264    Cardiac Enzymes Recent Labs  Lab 10/31/19 2007 10/31/19 2127  TROPONINIHS 273* 422*    BNP Recent Labs  Lab 10/31/19 2029  BNP 87.7     Radiology    DG Chest 2 View  Result Date: 10/31/2019 CLINICAL DATA:  Chest pain EXAM: CHEST - 2 VIEW COMPARISON:  Chest radiograph dated 11/23/2012. FINDINGS: The heart size and mediastinal contours are within normal limits. Both lungs are clear. The visualized skeletal structures are unremarkable. IMPRESSION: No active cardiopulmonary disease. Electronically Signed   By: Romona Curls M.D.   On: 10/31/2019 20:24   ECHOCARDIOGRAM COMPLETE  Result Date: 11/01/2019    ECHOCARDIOGRAM REPORT   Patient Name:   John May Date of Exam: 11/01/2019 Medical Rec #:  [id number]   Height:       67.0 in Accession #:    [id number]  Weight:       200.2 lb Date of Birth:  1965-12-30   BSA:          2.023 m Patient Age:    53 years    BP:           130/88 mmHg Patient Gender: M           HR:           85 bpm. Exam Location:  Inpatient Procedure: 2D Echo, Color Doppler and Cardiac Doppler Indications:    Acute myocardial Infarction  History:        Patient has prior  history of Echocardiogram examinations, most                 recent 01/10/2010. Signs/Symptoms:Chest Pain.  Sonographer:    Roosvelt Maser RDCS Referring Phys: [phone] ADAM S BARNETT IMPRESSIONS  1. Left ventricular ejection fraction, by estimation, is 55%. The left ventricle has normal function. The left ventricle demonstrates regional wall motion abnormalities (see scoring diagram/findings for description). There is mild left ventricular hypertrophy. Left ventricular diastolic parameters are indeterminate.  2. Right ventricular systolic function is normal. The right ventricular size is normal. Tricuspid regurgitation signal is inadequate for assessing PA pressure.  3. The mitral valve is grossly normal. Trivial mitral valve regurgitation.  4. The aortic valve is tricuspid. Aortic valve regurgitation is not visualized. FINDINGS  Left Ventricle: Left ventricular ejection fraction, by estimation, is 55%. The left ventricle has normal function. The left ventricle demonstrates regional wall motion abnormalities. The left ventricular internal cavity size was normal in size. There is  mild left ventricular hypertrophy. Left ventricular diastolic parameters are indeterminate.  LV Wall Scoring: The apical septal segment, apical inferior segment, and apex are hypokinetic. The entire anterior wall, antero-lateral wall, inferior wall, apical lateral segment, mid inferoseptal segment, and basal inferoseptal segment are normal. Right Ventricle: The right ventricular size is normal. No increase in right ventricular wall thickness. Right ventricular systolic function is normal. Tricuspid regurgitation signal is inadequate for assessing PA pressure. Left Atrium: Left atrial size was normal in size. Right Atrium: Right atrial size was normal in size. Pericardium: There is no evidence of pericardial effusion. Mitral Valve: The mitral valve is grossly normal. Trivial mitral valve regurgitation. Tricuspid Valve: The tricuspid valve is  grossly normal. Tricuspid valve regurgitation is trivial. Aortic Valve: The aortic valve is tricuspid. Aortic valve regurgitation is not visualized. Mild aortic valve annular calcification. Pulmonic Valve: The pulmonic valve was grossly normal. Pulmonic valve  regurgitation is not visualized. Aorta: The aortic root was not well visualized. Venous: The inferior vena cava was not well visualized. IAS/Shunts: No atrial level shunt detected by color flow Doppler.  LEFT VENTRICLE PLAX 2D LVIDd:         3.70 cm LVIDs:         2.50 cm LV PW:         1.10 cm LV IVS:        1.10 cm LVOT diam:     1.90 cm LV SV:         42.53 ml LV SV Index:   21.02 LVOT Area:     2.84 cm  LV Volumes (MOD) LV vol d, MOD A2C: 84.2 ml LV vol d, MOD A4C: 71.5 ml LV vol s, MOD A2C: 50.0 ml LV vol s, MOD A4C: 30.8 ml LV SV MOD A2C:     34.2 ml LV SV MOD A4C:     71.5 ml LV SV MOD BP:      38.8 ml RIGHT VENTRICLE RV Basal diam:  3.00 cm RV S prime:     10.30 cm/s TAPSE (M-mode): 2.5 cm LEFT ATRIUM             Index       RIGHT ATRIUM           Index LA diam:        3.30 cm 1.63 cm/m  RA Area:     11.80 cm LA Vol (A2C):   51.6 ml 25.51 ml/m RA Volume:   23.10 ml  11.42 ml/m LA Vol (A4C):   44.8 ml 22.15 ml/m LA Biplane Vol: 50.5 ml 24.96 ml/m  AORTIC VALVE LVOT Vmax:   80.90 cm/s LVOT Vmean:  46.700 cm/s LVOT VTI:    0.150 m  AORTA Ao Root diam: 2.90 cm Ao Asc diam:  2.40 cm MITRAL VALVE MV Area (PHT): 3.27 cm    SHUNTS MV Decel Time: 232 msec    Systemic VTI:  0.15 m MV E velocity: 42.40 cm/s  Systemic Diam: 1.90 cm MV A velocity: 69.40 cm/s MV E/A ratio:  0.61 Nona Dell MD Electronically signed by Nona Dell MD Signature Date/Time: 11/01/2019/4:09:48 PM    Final     Cardiac Studies   Echocardiogram pending.  Patient Profile     54 y.o. male with a history of CAD status post DES to the RCA in 2013 with diffuse residual disease has been managed medically, type 2 diabetes mellitus, hyperlipidemia, hypertension, and OSA on  CPAP.  He presents with unstable angina and enzymatic evidence of NSTEMI.  Assessment & Plan    1.  Unstable angina with enzymatic evidence of NSTEMI.  High-sensitivity troponin I up to 422, no active chest pain at this time.  ECG shows age-indeterminate inferior infarct pattern and also anterolateral ST-T wave changes suggestive of ischemia.  He is currently on aspirin, Coreg, Norvasc, Zetia, HCTZ, lisinopril, Crestor, and IV heparin. Echo shows LVEF 55% with anteroapical WMA, suspect LAD infarct. He says he had pain for 3 days prior to presentation, ?missed STEMI. Plan LHC today.  2.  CAD status post DES to the RCA in 2013 with diffuse residual disease that is been managed medically so far.  3.  Mixed hyperlipidemia, currently on Crestor and Zetia.  Recent LDL 115.  May need to be considered for PCSK9 inhibitor at outpatient follow-up.  He reports compliance with his medications.  4.  Essential hypertension, blood pressures ranging 115-140 recently.  No changes for now on current regimen.    5.  Type 2 diabetes mellitus. Glucophage currently on hold in anticipation of angiography.  He is otherwise on Jardiance and Actos as an outpatient.  Hemoglobin A1c 7.2%.  6. AKI - creatinine up to 1.4 today - on lisinopril 40 mg, will hold for anticipated cath today. Continue IV hydration and minimize dye load.  Chrystie Nose, MD, [facility], FACP  [place]  [facility]  Medical Director of the Advanced Lipid Disorders &  Cardiovascular Risk Reduction Clinic Diplomate of the American Board of Clinical Lipidology Attending Cardiologist  Direct Dial: [phone]  Fax: [phone]  Website:  [email or web address]  Chrystie Nose, MD  11/02/2019, 8:37 AM

## 2019-11-02 NOTE — Progress Notes (Signed)
[id number] Discussed with pt the importance of IS and mobility after surgery. Discussed sternal precautions and staying in the tube handout given. Gave pt IS and he was able to get to 1750 ml. Wrote down how to view pre op video but pt doubtful he will watch. Has been reading [facility] booklet. Pt will have his mother and others to assist with care after discharge. Will follow up after surgery. Luetta Nutting RN BSN 11/02/2019 2:38 PM

## 2019-11-02 NOTE — Progress Notes (Signed)
ANTICOAGULATION CONSULT NOTE  Pharmacy Consult for IV heparin Indication: chest pain/ACS  Allergies  Allergen Reactions  . Bee Venom Swelling  . Food Allergy Formula Swelling    "Crab in stuffing; I eat shrimp and I don't have problems"  . Crab [Shellfish Allergy]   . Nutritional Supplements Swelling    unknown    Patient Measurements: Height: 5\' 7"  (170.2 cm) Weight: 198 lb 10.2 oz (90.1 kg) IBW/kg (Calculated) : 66.1  Heparin Dosing Weight: 88 kg  Vital Signs: Temp: 98 F (36.7 C) (02/22 0739) Temp Source: Oral (02/22 0739) BP: 124/81 (02/22 1048) Pulse Rate: 69 (02/22 1048)  Labs: Recent Labs    10/31/19 2007 10/31/19 2007 10/31/19 2127 11/01/19 0502 11/01/19 1044 11/02/19 0132  HGB 14.4   < >  --  14.2  --  14.4  HCT 44.6  --   --  43.7  --  44.1  PLT 271  --   --  256  --  264  HEPARINUNFRC  --   --   --  0.60 0.66 0.63  CREATININE 1.12  --   --  1.14  --  1.47*  TROPONINIHS 273*  --  422*  --   --   --    < > = values in this interval not displayed.    Estimated Creatinine Clearance: 63.7 mL/min (A) (by C-G formula based on SCr of 1.47 mg/dL (H)).   Medical History: Past Medical History:  Diagnosis Date  . CAD (coronary artery disease)    a. 01/2010 - MI with unsuccessful PCI of RCA. b. s/p PCI/DES to mid RCA 05/2012. c. Cath 11/2012 for abnl stress test - diffuse branch and distal vessel CAD for med rx.    . Diabetes mellitus (HCC)    a. A1c 6.9 in 11/2012.  12/2012 Hyperlipidemia   . Hypertension   . OSA on CPAP   . Paresthesias    a. bilat upper & lower extremities - intermittently present since 2011.    Medications:  Infusions:  . heparin 1,200 Units/hr (11/01/19 1441)    Assessment: 54 yo male with known CAD admitted with chest pain on heparin. No anticoagulation noted PTA.  He is now s/p cath with severe 3VCAD and CVTS consulted. Heparin to restart 8 hours post sheath removal (removed ~ 10:45am)  Goal of Therapy:  Heparin level 0.3-0.7  units/ml Monitor platelets by anticoagulation protocol: Yes   Plan:  -Restart heparin at 7pm at 1200 units/hr -Daily heparin level and CBC  44, PharmD Clinical Pharmacist **Pharmacist phone directory can now be found on [email or web address] ([email or web address]).  Listed under [facility].

## 2019-11-02 NOTE — Consult Note (Signed)
[address]       [place] [address]             [phone]        [name] [facility] Medical Record #[id number] Date of Birth: 1965-11-17  Referring: No ref. provider found Primary Care: Fleet Contras, MD Primary Cardiologist:Brian Jens Som, MD  Chief Complaint:    Chief Complaint  Patient presents with  . Chest Pain    History of Present Illness:     54 yo man with 20 yr h/o CAD s/p PCI in 2013 presented with [place] over the weekend. He is known to have diffuse residual disease which has been managed medically.  Eval showed NSTEMI on this admission. He underwent LHC today demonstrating multivessel CAD. Referred for CABG. Presently asx. Denies sx of heart failure.    Current Activity/ Functional Status: Patient will be independent with mobility/ambulation, transfers, ADL's, IADL's.   Zubrod Score: At the time of surgery this patient's most appropriate activity status/level should be described as: []     0    Normal activity, no symptoms []     1    Restricted in physical strenuous activity but ambulatory, able to do out light work []     2    Ambulatory and capable of self care, unable to do work activities, up and about                 more than 50%  Of the time                            []     3    Only limited self care, in bed greater than 50% of waking hours []     4    Completely disabled, no self care, confined to bed or chair []     5    Moribund  Past Medical History:  Diagnosis Date  . CAD (coronary artery disease)    a. 01/2010 - MI with unsuccessful PCI of RCA. b. s/p PCI/DES to mid RCA 05/2012. c. Cath 11/2012 for abnl stress test - diffuse branch and distal vessel CAD for med rx.    . Diabetes mellitus (HCC)    a. A1c 6.9 in 11/2012.  Hyperlipidemia   . Hypertension   . OSA on CPAP   . Paresthesias    a. bilat upper & lower extremities - intermittently present since 2011.    Past Surgical History:  Procedure Laterality Date  .  CORONARY ANGIOPLASTY WITH STENT PLACEMENT  05/14/2012   "1"  . LEFT HEART CATH AND CORONARY ANGIOGRAPHY N/A 11/02/2019   Procedure: LEFT HEART CATH AND CORONARY ANGIOGRAPHY;  Surgeon: 12/2012, MD;  Location: [facility] INVASIVE CV LAB;  Service: Cardiovascular;  Laterality: N/A;  . LEFT HEART CATHETERIZATION WITH CORONARY ANGIOGRAM N/A 11/25/2012   Procedure: LEFT HEART CATHETERIZATION WITH CORONARY ANGIOGRAM;  Surgeon: Marland Kitchen, MD;  Location: [facility] CATH LAB;  Service: Cardiovascular;  Laterality: N/A;  . PATELLAR TENDON REPAIR  1990's   left  . PERCUTANEOUS CORONARY STENT INTERVENTION (PCI-S) N/A 05/14/2012   Procedure: PERCUTANEOUS CORONARY STENT INTERVENTION (PCI-S);  Surgeon: 11/04/2019, MD;  Location: [facility] CATH LAB;  Service: Cardiovascular;  Laterality: N/A;    Social History   Tobacco Use  Smoking Status Former Smoker  . Packs/day: 0.50  . Years: 18.00  . Pack years: 9.00  . Types: Cigarettes  .  Quit date: 11/26/2003  . Years since quitting: 15.9  Smokeless Tobacco Never Used    Social History   Substance and Sexual Activity  Alcohol Use No  . Alcohol/week: 0.0 standard drinks     Allergies  Allergen Reactions  . Bee Venom Swelling  . Food Allergy Formula Swelling    "Crab in stuffing; I eat shrimp and I don't have problems"  . Crab [Shellfish Allergy]   . Nutritional Supplements Swelling    unknown    Current Facility-Administered Medications  Medication Dose Route Frequency Provider Last Rate Last Admin  . 0.9 %  sodium chloride infusion   Intravenous Continuous Verne Carrow D, MD      . 0.9 %  sodium chloride infusion  250 mL Intravenous PRN Kathleene Hazel, MD      . acetaminophen (TYLENOL) tablet 650 mg  650 mg Oral Q4H PRN Kathleene Hazel, MD      . amLODipine (NORVASC) tablet 10 mg  10 mg Oral Daily Kathleene Hazel, MD   10 mg at 11/02/19 1251  . aspirin EC tablet 81 mg  81 mg Oral Daily Kathleene Hazel, MD   81 mg at 11/02/19 1250  . carvedilol (COREG) tablet 12.5 mg  12.5 mg Oral BID WC Kathleene Hazel, MD   12.5 mg at 11/02/19 1791  . ezetimibe (ZETIA) tablet 10 mg  10 mg Oral Daily Kathleene Hazel, MD   10 mg at 11/02/19 1250  . heparin ADULT infusion 100 units/mL (25000 units/278mL sodium chloride 0.45%)  1,200 Units/hr Intravenous Continuous Silvana Newness, RPH      . hydrALAZINE (APRESOLINE) injection 10 mg  10 mg Intravenous Q20 Min PRN Kathleene Hazel, MD      . hydrochlorothiazide (HYDRODIURIL) tablet 25 mg  25 mg Oral Daily Kathleene Hazel, MD   25 mg at 11/02/19 1322  . insulin aspart (novoLOG) injection 0-15 Units  0-15 Units Subcutaneous TID WC Kathleene Hazel, MD   3 Units at 11/02/19 [phone]  . labetalol (NORMODYNE) injection 10 mg  10 mg Intravenous Q10 min PRN Kathleene Hazel, MD      . nitroGLYCERIN (NITROSTAT) SL tablet 0.4 mg  0.4 mg Sublingual Q5 Min x 3 PRN Kathleene Hazel, MD      . ondansetron [facility]) injection 4 mg  4 mg Intravenous Q6H PRN Kathleene Hazel, MD      . Melene Muller ON 11/03/2019] pneumococcal 23 valent vaccine (PNEUMOVAX-23) injection 0.5 mL  0.5 mL Intramuscular Tomorrow-1000 Donato Schultz C, MD      . rosuvastatin (CRESTOR) tablet 40 mg  40 mg Oral q1800 Kathleene Hazel, MD   40 mg at 11/01/19 1801  . sodium bicarbonate 150 mEq in dextrose 5% 1000 mL infusion  150 mEq Intravenous Continuous Pharaoh Pio Z, MD      . sodium chloride flush (NS) 0.9 % injection 3 mL  3 mL Intravenous Q12H McAlhany, Christopher D, MD      . sodium chloride flush (NS) 0.9 % injection 3 mL  3 mL Intravenous PRN Kathleene Hazel, MD        Medications Prior to Admission  Medication Sig Dispense Refill Last Dose  . amLODipine (NORVASC) 10 MG tablet TAKE 1 TABLET BY MOUTH EVERY DAY (Patient taking differently: Take 10 mg by mouth daily. ) 90 tablet 3 10/31/2019 at Unknown time  . aspirin EC 81  MG tablet Take 81 mg by mouth daily.  10/31/2019 at Unknown time  . ezetimibe (ZETIA) 10 MG tablet TAKE 1 TABLET BY MOUTH EVERY DAY (Patient taking differently: Take 10 mg by mouth daily. ) 30 tablet 6 10/31/2019 at Unknown time  . hydrochlorothiazide (HYDRODIURIL) 25 MG tablet Take 25 mg by mouth daily.  5 10/31/2019 at Unknown time  . JARDIANCE 25 MG TABS tablet Take 25 mg by mouth daily.  5 10/31/2019 at Unknown time  . lisinopril (ZESTRIL) 40 MG tablet TAKE 1 TABLET BY MOUTH EVERY DAY (Patient taking differently: Take 40 mg by mouth daily. ) 30 tablet 11 10/31/2019 at Unknown time  . metFORMIN (GLUCOPHAGE) 1000 MG tablet Take 1,000 mg by mouth 2 (two) times daily.  5 10/31/2019 at Unknown time  . metoprolol succinate (TOPROL XL) 25 MG 24 hr tablet Take 1 tablet (25 mg total) by mouth at bedtime. 90 tablet 3 10/30/2019 at 2230  . pioglitazone (ACTOS) 30 MG tablet Take 30 mg by mouth daily.  5 10/31/2019 at Unknown time  . rosuvastatin (CRESTOR) 40 MG tablet TAKE 1 TABLET BY MOUTH EVERY DAY (Patient taking differently: Take 40 mg by mouth daily. ) 30 tablet 10 10/31/2019 at Unknown time  . sildenafil (REVATIO) 20 MG tablet Take 60-100 mg by mouth daily as needed.   unknown  . Vitamin D, Ergocalciferol, (DRISDOL) 50000 units CAPS capsule Take 50,000 Units by mouth once a week. Sundays  5 10/24/2019  . nitroGLYCERIN (NITROSTAT) 0.4 MG SL tablet Place 1 tablet (0.4 mg total) under the tongue every 5 (five) minutes as needed for chest pain (up to 3 doses). (Patient not taking: Reported on 10/31/2019) 75 tablet 4 Not Taking at Unknown time    Family History  Problem Relation Age of Onset  . Hypertension Mother   . Cancer Mother   . Diabetes Mother   . Stroke Mother   . Cancer Father      Review of Systems:   ROS A comprehensive review of systems was negative.     Cardiac Review of Systems: Y or  [    ]= no  Chest Pain [  y  ]  Resting SOB [ n  ] Exertional SOB  [  ]  Orthopnea [  ]   Pedal Edema [    ]    Palpitations [  ] Syncope  [  ]   Presyncope [   ]  General Review of Systems: [Y] = yes [  ]=no Constitional: recent weight change [n  ]; anorexia [ n ]; fatigue [  ]; nausea [  ]; night sweats [  ]; fever [  ]; or chills [  ]                                                               Dental: Last Dentist visit:   Eye : blurred vision [ n ]; diplopia [   ]; vision changes [n  ];  Amaurosis fugax[ n ]; Resp: cough [n ];  wheezing[n  ];  hemoptysis[  ]; shortness of breath[  ]; paroxysmal nocturnal dyspnea[  ]; dyspnea on exertion[  ]; or orthopnea[  ];  GI:  gallstones[  n], vomiting[n  ];  dysphagia[  ]; melena[  ];  hematochezia [  ]; heartburn[  ];  Hx of  Colonoscopy[  ]; GU: kidney stones [n  ]; hematuria[n  ];   dysuria [  ];  nocturia[  ];  history of     obstruction [  ]; urinary frequency [  ]             Skin: rash, swelling[n  ];, hair loss[  ];  peripheral edema[  ];  or itching[  ]; Musculosketetal: myalgias[n  ];  joint swelling[ n ];  joint erythema[  ];  joint pain[  ];  back pain[  ];  Heme/Lymph: bruising[n  ];  bleeding[ n ];  anemia[  ];  Neuro: TIA[ n ];  headaches[n  ];  stroke[n  ];  vertigo[  ];  seizures[  ];   paresthesias[  ];  difficulty walking[  ];  Psych:depression[  n]; anxiety[ n ];  Endocrine: diabetes[ y ];  thyroid dysfunction[  ];            Physical Exam: BP (!) 143/92 (BP Location: Left Wrist)   Pulse 66   Temp 98 F (36.7 C) (Oral)   Resp 14   Ht 5\' 7"  (1.702 m)   Wt 90.1 kg   SpO2 99%   BMI 31.11 kg/m    General appearance: alert and cooperative Head: Normocephalic, without obvious abnormality, atraumatic Neck: no adenopathy, no carotid bruit, no JVD, supple, symmetrical, trachea midline and thyroid not enlarged, symmetric, no tenderness/mass/nodules Resp: clear to auscultation bilaterally Cardio: regular rate and rhythm, S1, S2 normal, no murmur, click, rub or gallop GI: soft, non-tender; bowel sounds normal; no masses,  no  organomegaly Extremities: extremities normal, atraumatic, no cyanosis or edema Neurologic: Alert and oriented X 3, normal strength and tone. Normal symmetric reflexes. Normal coordination and gait  Diagnostic Studies & Laboratory data:     Recent Radiology Findings:   DG Chest 2 View  Result Date: 10/31/2019 CLINICAL DATA:  Chest pain EXAM: CHEST - 2 VIEW COMPARISON:  Chest radiograph dated 11/23/2012. FINDINGS: The heart size and mediastinal contours are within normal limits. Both lungs are clear. The visualized skeletal structures are unremarkable. IMPRESSION: No active cardiopulmonary disease. Electronically Signed   By: Romona Curls M.D.   On: 10/31/2019 20:24   CARDIAC CATHETERIZATION  Result Date: 11/02/2019  1st Mrg lesion is 90% stenosed.  2nd Mrg lesion is 90% stenosed.  Mid Cx lesion is 90% stenosed.  1st Diag lesion is 95% stenosed.  Mid LAD lesion is 99% stenosed.  Dist LAD lesion is 99% stenosed.  Dist RCA lesion is 100% stenosed.  Prox RCA to Mid RCA lesion is 99% stenosed.  Prox RCA lesion is 50% stenosed.  Severe triple vessel CAD Recommendations: Severe multi-vessel CAD. Will consult CT surgery for consideration of CABG which appears to be the best method revascularization given the complexity of his disease.   ECHOCARDIOGRAM COMPLETE  Result Date: 11/01/2019    ECHOCARDIOGRAM REPORT   Patient Name:   John May Date of Exam: 11/01/2019 Medical Rec #:  [id number]   Height:       67.0 in Accession #:    [id number]  Weight:       200.2 lb Date of Birth:  08-14-1966   BSA:          2.023 m Patient Age:    53 years    BP:           130/88 mmHg Patient Gender: M  HR:           85 bpm. Exam Location:  Inpatient Procedure: 2D Echo, Color Doppler and Cardiac Doppler Indications:    Acute myocardial Infarction  History:        Patient has prior history of Echocardiogram examinations, most                 recent 01/10/2010. Signs/Symptoms:Chest Pain.  Sonographer:    Roosvelt Maserachel  Lane RDCS Referring Phys: [phone] ADAM S BARNETT IMPRESSIONS  1. Left ventricular ejection fraction, by estimation, is 55%. The left ventricle has normal function. The left ventricle demonstrates regional wall motion abnormalities (see scoring diagram/findings for description). There is mild left ventricular hypertrophy. Left ventricular diastolic parameters are indeterminate.  2. Right ventricular systolic function is normal. The right ventricular size is normal. Tricuspid regurgitation signal is inadequate for assessing PA pressure.  3. The mitral valve is grossly normal. Trivial mitral valve regurgitation.  4. The aortic valve is tricuspid. Aortic valve regurgitation is not visualized. FINDINGS  Left Ventricle: Left ventricular ejection fraction, by estimation, is 55%. The left ventricle has normal function. The left ventricle demonstrates regional wall motion abnormalities. The left ventricular internal cavity size was normal in size. There is  mild left ventricular hypertrophy. Left ventricular diastolic parameters are indeterminate.  LV Wall Scoring: The apical septal segment, apical inferior segment, and apex are hypokinetic. The entire anterior wall, antero-lateral wall, inferior wall, apical lateral segment, mid inferoseptal segment, and basal inferoseptal segment are normal. Right Ventricle: The right ventricular size is normal. No increase in right ventricular wall thickness. Right ventricular systolic function is normal. Tricuspid regurgitation signal is inadequate for assessing PA pressure. Left Atrium: Left atrial size was normal in size. Right Atrium: Right atrial size was normal in size. Pericardium: There is no evidence of pericardial effusion. Mitral Valve: The mitral valve is grossly normal. Trivial mitral valve regurgitation. Tricuspid Valve: The tricuspid valve is grossly normal. Tricuspid valve regurgitation is trivial. Aortic Valve: The aortic valve is tricuspid. Aortic valve regurgitation is  not visualized. Mild aortic valve annular calcification. Pulmonic Valve: The pulmonic valve was grossly normal. Pulmonic valve regurgitation is not visualized. Aorta: The aortic root was not well visualized. Venous: The inferior vena cava was not well visualized. IAS/Shunts: No atrial level shunt detected by color flow Doppler.  LEFT VENTRICLE PLAX 2D LVIDd:         3.70 cm LVIDs:         2.50 cm LV PW:         1.10 cm LV IVS:        1.10 cm LVOT diam:     1.90 cm LV SV:         42.53 ml LV SV Index:   21.02 LVOT Area:     2.84 cm  LV Volumes (MOD) LV vol d, MOD A2C: 84.2 ml LV vol d, MOD A4C: 71.5 ml LV vol s, MOD A2C: 50.0 ml LV vol s, MOD A4C: 30.8 ml LV SV MOD A2C:     34.2 ml LV SV MOD A4C:     71.5 ml LV SV MOD BP:      38.8 ml RIGHT VENTRICLE RV Basal diam:  3.00 cm RV S prime:     10.30 cm/s TAPSE (M-mode): 2.5 cm LEFT ATRIUM             Index       RIGHT ATRIUM  Index LA diam:        3.30 cm 1.63 cm/m  RA Area:     11.80 cm LA Vol (A2C):   51.6 ml 25.51 ml/m RA Volume:   23.10 ml  11.42 ml/m LA Vol (A4C):   44.8 ml 22.15 ml/m LA Biplane Vol: 50.5 ml 24.96 ml/m  AORTIC VALVE LVOT Vmax:   80.90 cm/s LVOT Vmean:  46.700 cm/s LVOT VTI:    0.150 m  AORTA Ao Root diam: 2.90 cm Ao Asc diam:  2.40 cm MITRAL VALVE MV Area (PHT): 3.27 cm    SHUNTS MV Decel Time: 232 msec    Systemic VTI:  0.15 m MV E velocity: 42.40 cm/s  Systemic Diam: 1.90 cm MV A velocity: 69.40 cm/s MV E/A ratio:  0.61 Nona Dell MD Electronically signed by Nona Dell MD Signature Date/Time: 11/01/2019/4:09:48 PM    Final    VAS US DOPPLER PRE CABG  Result Date: 11/02/2019 PREOPERATIVE VASCULAR EVALUATION  Indications:      Pre-CABG. Risk Factors:     Hypertension, hyperlipidemia, coronary artery disease. Comparison Study: No prior study Performing Technologist: Gertie Fey MHA, RDMS, RVT, RDCS  Examination Guidelines: A complete evaluation includes B-mode imaging, spectral Doppler, color Doppler, and power  Doppler as needed of all accessible portions of each vessel. Bilateral testing is considered an integral part of a complete examination. Limited examinations for reoccurring indications may be performed as noted.  Right Carotid Findings: +----------+--------+-------+--------+----------------------+------------------+           PSV cm/sEDV    StenosisDescribe              Comments                             cm/s                                                    +----------+--------+-------+--------+----------------------+------------------+ CCA Prox  89      24                                   intimal thickening +----------+--------+-------+--------+----------------------+------------------+ CCA Distal85      23                                   intimal thickening +----------+--------+-------+--------+----------------------+------------------+ ICA Prox  75      24             smooth and                                                                heterogenous                             +----------+--------+-------+--------+----------------------+------------------+ ICA Distal83      37                                                      +----------+--------+-------+--------+----------------------+------------------+  ECA       116     12                                                      +----------+--------+-------+--------+----------------------+------------------+ Portions of this table do not appear on this page. +----------+--------+-------+----------------+------------+           PSV cm/sEDV cmsDescribe        Arm Pressure +----------+--------+-------+----------------+------------+ Subclavian120            Multiphasic, [id number]          +----------+--------+-------+----------------+------------+ +---------+--------+--+--------+-+---------+ VertebralPSV cm/s26EDV cm/s7Antegrade +---------+--------+--+--------+-+---------+ Left  Carotid Findings: +----------+--------+--------+--------+-----------------------+--------+           PSV cm/sEDV cm/sStenosisDescribe               Comments +----------+--------+--------+--------+-----------------------+--------+ CCA Prox  110     24                                              +----------+--------+--------+--------+-----------------------+--------+ CCA Distal102     28              smooth and heterogenous         +----------+--------+--------+--------+-----------------------+--------+ ICA Prox  51      21              smooth and heterogenous         +----------+--------+--------+--------+-----------------------+--------+ ICA Distal77      36                                              +----------+--------+--------+--------+-----------------------+--------+ ECA       114     14                                              +----------+--------+--------+--------+-----------------------+--------+ +----------+--------+--------+----------------+------------+ SubclavianPSV cm/sEDV cm/sDescribe        Arm Pressure +----------+--------+--------+----------------+------------+           237             Multiphasic, WNL             +----------+--------+--------+----------------+------------+ +---------+--------+--+--------+--+---------+ VertebralPSV cm/s44EDV cm/s16Antegrade +---------+--------+--+--------+--+---------+  ABI Findings: +--------+------------------+-----+---------+--------+ Right   Rt Pressure (mmHg)IndexWaveform Comment  +--------+------------------+-----+---------+--------+ [id number]                    triphasic         +--------+------------------+-----+---------+--------+ PTA                            triphasic         +--------+------------------+-----+---------+--------+ DP                             triphasic         +--------+------------------+-----+---------+--------+  +--------+------------------+-----+---------+----------------------------------+ Left    Lt Pressure (mmHg)IndexWaveform Comment                            +--------+------------------+-----+---------+----------------------------------+  Brachial                       triphasicUnable to obtain pressure due to                                           IV location                        +--------+------------------+-----+---------+----------------------------------+ PTA                            triphasic                                   +--------+------------------+-----+---------+----------------------------------+ DP                             triphasic                                   +--------+------------------+-----+---------+----------------------------------+  Right Doppler Findings: +--------+--------+-----+---------+--------+ Site    PressureIndexDoppler  Comments +--------+--------+-----+---------+--------+ [id number]          triphasic         +--------+--------+-----+---------+--------+ Radial               triphasic         +--------+--------+-----+---------+--------+ Ulnar                triphasic         +--------+--------+-----+---------+--------+  Left Doppler Findings: +--------+--------+-----+---------+--------------------------------------------+ Site    PressureIndexDoppler  Comments                                     +--------+--------+-----+---------+--------------------------------------------+ Brachial             triphasicUnable to obtain pressure due to IV location +--------+--------+-----+---------+--------------------------------------------+ Radial               triphasic                                             +--------+--------+-----+---------+--------------------------------------------+ Ulnar                triphasic                                              +--------+--------+-----+---------+--------------------------------------------+  Summary: Right Carotid: Velocities in the right ICA are consistent with a 1-39% stenosis. Left Carotid: Velocities in the left ICA are consistent with a 1-39% stenosis. Vertebrals:  Bilateral vertebral arteries demonstrate antegrade flow. Subclavians: Normal flow hemodynamics were seen in bilateral subclavian              arteries. Pedal waveforms: Bilateral pedal waveforms are within normal limits with triphasic waveforms. Right Upper Extremity: Doppler waveforms remain within normal limits with right radial compression. Doppler waveform obliterate with right ulnar compression.  Left Upper Extremity:  Doppler waveforms decrease >50% with left radial compression. Doppler waveforms remain within normal limits with left ulnar compression.     Preliminary      I have independently reviewed the above radiologic studies and discussed with the patient   Recent Lab Findings: Lab Results  Component Value Date   WBC 6.5 11/02/2019   HGB 14.4 11/02/2019   HCT 44.1 11/02/2019   PLT 264 11/02/2019   GLUCOSE 157 (H) 11/02/2019   CHOL 192 11/01/2019   TRIG 252 (H) 11/01/2019   HDL 27 (L) 11/01/2019   LDLCALC 115 (H) 11/01/2019   ALT 30 10/31/2019   AST 28 10/31/2019   NA 136 11/02/2019   K 3.6 11/02/2019   CL 99 11/02/2019   CREATININE 1.47 (H) 11/02/2019   BUN 16 11/02/2019   CO2 26 11/02/2019   TSH 2.421 11/24/2012   INR 1.03 11/25/2012   HGBA1C 7.2 (H) 11/01/2019      Assessment / Plan:      54 yo man with extensive h/o CAD. He has preserved LV systolic function and end-organ function, therefore a good candidate for CABG. Although his CAD involvement is extensive and diffuse, agree that CABG is best option for long-term sx relief and survival. Additionally, he is a good candidate for multiple arterial graft strategy.  Will proceed with surgery on 11/03/19.    I  spent 30 minutes counseling the patient face to  face.   Isatu Macinnes Z. Vickey SagesAtkins, MD [phone] 11/02/2019 5:24 PM

## 2019-11-03 ENCOUNTER — Inpatient Hospital Stay (HOSPITAL_COMMUNITY): Payer: Medicare Other | Admitting: Registered Nurse

## 2019-11-03 ENCOUNTER — Inpatient Hospital Stay (HOSPITAL_COMMUNITY): Payer: Medicare Other

## 2019-11-03 ENCOUNTER — Encounter (HOSPITAL_COMMUNITY)
Admission: EM | Disposition: A | Discharge status: Home / Self Care | Payer: Self-pay | Source: Home / Self Care | Attending: Cardiothoracic Surgery

## 2019-11-03 DIAGNOSIS — Z951 Presence of aortocoronary bypass graft: Secondary | ICD-10-CM

## 2019-11-03 DIAGNOSIS — I25110 Atherosclerotic heart disease of native coronary artery with unstable angina pectoris: Secondary | ICD-10-CM

## 2019-11-03 HISTORY — PX: CORONARY ARTERY BYPASS GRAFT: SHX141

## 2019-11-03 HISTORY — PX: ENDOVEIN HARVEST OF GREATER SAPHENOUS VEIN: SHX5059

## 2019-11-03 HISTORY — PX: RADIAL ARTERY HARVEST: SHX5067

## 2019-11-03 HISTORY — PX: TEE WITHOUT CARDIOVERSION: SHX5443

## 2019-11-03 LAB — POCT I-STAT 7, (LYTES, BLD GAS, ICA,H+H)
Acid-Base Excess: 2 mmol/L (ref 0.0–2.0)
Acid-base deficit: 3 mmol/L — ABNORMAL HIGH (ref 0.0–2.0)
Acid-base deficit: 2 mmol/L (ref 0.0–2.0)
Acid-base deficit: 1 mmol/L (ref 0.0–2.0)
Bicarbonate: 26.8 mmol/L (ref 20.0–28.0)
Bicarbonate: 21.2 mmol/L (ref 20.0–28.0)
Bicarbonate: 23.6 mmol/L (ref 20.0–28.0)
Bicarbonate: 23.2 mmol/L (ref 20.0–28.0)
Calcium, Ion: 0.99 mmol/L — ABNORMAL LOW (ref 1.15–1.40)
Calcium, Ion: 0.96 mmol/L — ABNORMAL LOW (ref 1.15–1.40)
Calcium, Ion: 1.08 mmol/L — ABNORMAL LOW (ref 1.15–1.40)
Calcium, Ion: 1.02 mmol/L — ABNORMAL LOW (ref 1.15–1.40)
HCT: 28 % — ABNORMAL LOW (ref 39.0–52.0)
HCT: 30 % — ABNORMAL LOW (ref 39.0–52.0)
HCT: 28 % — ABNORMAL LOW (ref 39.0–52.0)
HCT: 28 % — ABNORMAL LOW (ref 39.0–52.0)
Hemoglobin: 9.5 g/dL — ABNORMAL LOW (ref 13.0–17.0)
Hemoglobin: 10.2 g/dL — ABNORMAL LOW (ref 13.0–17.0)
Hemoglobin: 9.5 g/dL — ABNORMAL LOW (ref 13.0–17.0)
Hemoglobin: 9.5 g/dL — ABNORMAL LOW (ref 13.0–17.0)
O2 Saturation: 100 %
O2 Saturation: 97 %
O2 Saturation: 99 %
O2 Saturation: 99 %
Patient temperature: 35.3
Patient temperature: 36
Patient temperature: 36.7
Potassium: 3.4 mmol/L — ABNORMAL LOW (ref 3.5–5.1)
Potassium: 3.5 mmol/L (ref 3.5–5.1)
Potassium: 4.7 mmol/L (ref 3.5–5.1)
Potassium: 4.4 mmol/L (ref 3.5–5.1)
Sodium: 140 mmol/L (ref 135–145)
Sodium: 142 mmol/L (ref 135–145)
Sodium: 141 mmol/L (ref 135–145)
Sodium: 141 mmol/L (ref 135–145)
TCO2: 28 mmol/L (ref 22–32)
TCO2: 22 mmol/L (ref 22–32)
TCO2: 25 mmol/L (ref 22–32)
TCO2: 24 mmol/L (ref 22–32)
pCO2 arterial: 41.2 mmHg (ref 32.0–48.0)
pCO2 arterial: 30.8 mmHg — ABNORMAL LOW (ref 32.0–48.0)
pCO2 arterial: 40.7 mmHg (ref 32.0–48.0)
pCO2 arterial: 37.4 mmHg (ref 32.0–48.0)
pH, Arterial: 7.422 (ref 7.350–7.450)
pH, Arterial: 7.439 (ref 7.350–7.450)
pH, Arterial: 7.367 (ref 7.350–7.450)
pH, Arterial: 7.399 (ref 7.350–7.450)
pO2, Arterial: 306 mmHg — ABNORMAL HIGH (ref 83.0–108.0)
pO2, Arterial: 81 mmHg — ABNORMAL LOW (ref 83.0–108.0)
pO2, Arterial: 140 mmHg — ABNORMAL HIGH (ref 83.0–108.0)
pO2, Arterial: 125 mmHg — ABNORMAL HIGH (ref 83.0–108.0)

## 2019-11-03 LAB — POCT I-STAT, CHEM 8
BUN: 15 mg/dL (ref 6–20)
BUN: 17 mg/dL (ref 6–20)
BUN: 13 mg/dL (ref 6–20)
BUN: 13 mg/dL (ref 6–20)
BUN: 13 mg/dL (ref 6–20)
BUN: 12 mg/dL (ref 6–20)
Calcium, Ion: 1.22 mmol/L (ref 1.15–1.40)
Calcium, Ion: 1.2 mmol/L (ref 1.15–1.40)
Calcium, Ion: 0.98 mmol/L — ABNORMAL LOW (ref 1.15–1.40)
Calcium, Ion: 0.99 mmol/L — ABNORMAL LOW (ref 1.15–1.40)
Calcium, Ion: 1.02 mmol/L — ABNORMAL LOW (ref 1.15–1.40)
Calcium, Ion: 0.95 mmol/L — ABNORMAL LOW (ref 1.15–1.40)
Chloride: 102 mmol/L (ref 98–111)
Chloride: 101 mmol/L (ref 98–111)
Chloride: 101 mmol/L (ref 98–111)
Chloride: 102 mmol/L (ref 98–111)
Chloride: 103 mmol/L (ref 98–111)
Chloride: 102 mmol/L (ref 98–111)
Creatinine, Ser: 0.9 mg/dL (ref 0.61–1.24)
Creatinine, Ser: 0.9 mg/dL (ref 0.61–1.24)
Creatinine, Ser: 0.8 mg/dL (ref 0.61–1.24)
Creatinine, Ser: 0.8 mg/dL (ref 0.61–1.24)
Creatinine, Ser: 0.8 mg/dL (ref 0.61–1.24)
Creatinine, Ser: 0.7 mg/dL (ref 0.61–1.24)
Glucose, Bld: 192 mg/dL — ABNORMAL HIGH (ref 70–99)
Glucose, Bld: 182 mg/dL — ABNORMAL HIGH (ref 70–99)
Glucose, Bld: 144 mg/dL — ABNORMAL HIGH (ref 70–99)
Glucose, Bld: 148 mg/dL — ABNORMAL HIGH (ref 70–99)
Glucose, Bld: 154 mg/dL — ABNORMAL HIGH (ref 70–99)
Glucose, Bld: 145 mg/dL — ABNORMAL HIGH (ref 70–99)
HCT: 39 % (ref 39.0–52.0)
HCT: 37 % — ABNORMAL LOW (ref 39.0–52.0)
HCT: 26 % — ABNORMAL LOW (ref 39.0–52.0)
HCT: 26 % — ABNORMAL LOW (ref 39.0–52.0)
HCT: 29 % — ABNORMAL LOW (ref 39.0–52.0)
HCT: 22 % — ABNORMAL LOW (ref 39.0–52.0)
Hemoglobin: 13.3 g/dL (ref 13.0–17.0)
Hemoglobin: 12.6 g/dL — ABNORMAL LOW (ref 13.0–17.0)
Hemoglobin: 8.8 g/dL — ABNORMAL LOW (ref 13.0–17.0)
Hemoglobin: 8.8 g/dL — ABNORMAL LOW (ref 13.0–17.0)
Hemoglobin: 9.9 g/dL — ABNORMAL LOW (ref 13.0–17.0)
Hemoglobin: 7.5 g/dL — ABNORMAL LOW (ref 13.0–17.0)
Potassium: 3.8 mmol/L (ref 3.5–5.1)
Potassium: 3.8 mmol/L (ref 3.5–5.1)
Potassium: 4.5 mmol/L (ref 3.5–5.1)
Potassium: 4.5 mmol/L (ref 3.5–5.1)
Potassium: 4.5 mmol/L (ref 3.5–5.1)
Potassium: 3.6 mmol/L (ref 3.5–5.1)
Sodium: 139 mmol/L (ref 135–145)
Sodium: 138 mmol/L (ref 135–145)
Sodium: 139 mmol/L (ref 135–145)
Sodium: 139 mmol/L (ref 135–145)
Sodium: 139 mmol/L (ref 135–145)
Sodium: 141 mmol/L (ref 135–145)
TCO2: 27 mmol/L (ref 22–32)
TCO2: 29 mmol/L (ref 22–32)
TCO2: 28 mmol/L (ref 22–32)
TCO2: 29 mmol/L (ref 22–32)
TCO2: 29 mmol/L (ref 22–32)
TCO2: 26 mmol/L (ref 22–32)

## 2019-11-03 LAB — BASIC METABOLIC PANEL
Anion gap: 5 (ref 5–15)
BUN: 10 mg/dL (ref 6–20)
CO2: 24 mmol/L (ref 22–32)
Calcium: 6.7 mg/dL — ABNORMAL LOW (ref 8.9–10.3)
Chloride: 109 mmol/L (ref 98–111)
Creatinine, Ser: 0.82 mg/dL (ref 0.61–1.24)
GFR calc Af Amer: >60 mL/min (ref >60)
GFR calc non Af Amer: >60 mL/min (ref >60)
Glucose, Bld: 138 mg/dL — ABNORMAL HIGH (ref 70–99)
Potassium: 4.3 mmol/L (ref 3.5–5.1)
Sodium: 138 mmol/L (ref 135–145)

## 2019-11-03 LAB — URINALYSIS, ROUTINE W REFLEX MICROSCOPIC
Bacteria, UA: NONE SEEN
Bilirubin Urine: NEGATIVE
Glucose, UA: >=500 mg/dL — AB
Hgb urine dipstick: NEGATIVE
Ketones, ur: 5 mg/dL — AB
Leukocytes,Ua: NEGATIVE
Nitrite: NEGATIVE
Protein, ur: NEGATIVE mg/dL
Specific Gravity, Urine: 1.027 (ref 1.005–1.030)
pH: 5 (ref 5.0–8.0)

## 2019-11-03 LAB — CBC
HCT: 42.8 % (ref 39.0–52.0)
HCT: 32.4 % — ABNORMAL LOW (ref 39.0–52.0)
HCT: 28.9 % — ABNORMAL LOW (ref 39.0–52.0)
Hemoglobin: 14.2 g/dL (ref 13.0–17.0)
Hemoglobin: 10.7 g/dL — ABNORMAL LOW (ref 13.0–17.0)
Hemoglobin: 9.5 g/dL — ABNORMAL LOW (ref 13.0–17.0)
MCH: 27.2 pg (ref 26.0–34.0)
MCH: 27.3 pg (ref 26.0–34.0)
MCH: 27.5 pg (ref 26.0–34.0)
MCHC: 33.2 g/dL (ref 30.0–36.0)
MCHC: 33 g/dL (ref 30.0–36.0)
MCHC: 32.9 g/dL (ref 30.0–36.0)
MCV: 81.8 fL (ref 80.0–100.0)
MCV: 82.7 fL (ref 80.0–100.0)
MCV: 83.5 fL (ref 80.0–100.0)
Platelets: 274 10*3/uL (ref 150–400)
Platelets: 121 10*3/uL — ABNORMAL LOW (ref 150–400)
Platelets: 144 10*3/uL — ABNORMAL LOW (ref 150–400)
RBC: 5.23 MIL/uL (ref 4.22–5.81)
RBC: 3.92 MIL/uL — ABNORMAL LOW (ref 4.22–5.81)
RBC: 3.46 MIL/uL — ABNORMAL LOW (ref 4.22–5.81)
RDW: 12.4 % (ref 11.5–15.5)
RDW: 12.5 % (ref 11.5–15.5)
RDW: 12.6 % (ref 11.5–15.5)
WBC: 6.8 10*3/uL (ref 4.0–10.5)
WBC: 13.8 10*3/uL — ABNORMAL HIGH (ref 4.0–10.5)
WBC: 14.2 10*3/uL — ABNORMAL HIGH (ref 4.0–10.5)
nRBC: 0 % (ref 0.0–0.2)
nRBC: 0 % (ref 0.0–0.2)
nRBC: 0 % (ref 0.0–0.2)

## 2019-11-03 LAB — GLUCOSE, CAPILLARY
Glucose-Capillary: 192 mg/dL — ABNORMAL HIGH (ref 70–99)
Glucose-Capillary: 136 mg/dL — ABNORMAL HIGH (ref 70–99)
Glucose-Capillary: 134 mg/dL — ABNORMAL HIGH (ref 70–99)
Glucose-Capillary: 118 mg/dL — ABNORMAL HIGH (ref 70–99)
Glucose-Capillary: 142 mg/dL — ABNORMAL HIGH (ref 70–99)
Glucose-Capillary: 152 mg/dL — ABNORMAL HIGH (ref 70–99)
Glucose-Capillary: 136 mg/dL — ABNORMAL HIGH (ref 70–99)
Glucose-Capillary: 134 mg/dL — ABNORMAL HIGH (ref 70–99)
Glucose-Capillary: 121 mg/dL — ABNORMAL HIGH (ref 70–99)

## 2019-11-03 LAB — BLOOD GAS, ARTERIAL
Acid-base deficit: 0.1 mmol/L (ref 0.0–2.0)
Bicarbonate: 23.9 mmol/L (ref 20.0–28.0)
Drawn by: 350431
FIO2: 21
O2 Saturation: 95.3 %
Patient temperature: 36.7
pCO2 arterial: 37 mmHg (ref 32.0–48.0)
pH, Arterial: 7.423 (ref 7.350–7.450)
pO2, Arterial: 76.8 mmHg — ABNORMAL LOW (ref 83.0–108.0)

## 2019-11-03 LAB — COMPREHENSIVE METABOLIC PANEL
ALT: 41 U/L (ref 0–44)
AST: 29 U/L (ref 15–41)
Albumin: 3.6 g/dL (ref 3.5–5.0)
Alkaline Phosphatase: 71 U/L (ref 38–126)
Anion gap: 13 (ref 5–15)
BUN: 16 mg/dL (ref 6–20)
CO2: 22 mmol/L (ref 22–32)
Calcium: 9 mg/dL (ref 8.9–10.3)
Chloride: 100 mmol/L (ref 98–111)
Creatinine, Ser: 1.2 mg/dL (ref 0.61–1.24)
GFR calc Af Amer: >60 mL/min (ref >60)
GFR calc non Af Amer: >60 mL/min (ref >60)
Glucose, Bld: 224 mg/dL — ABNORMAL HIGH (ref 70–99)
Potassium: 3.8 mmol/L (ref 3.5–5.1)
Sodium: 135 mmol/L (ref 135–145)
Total Bilirubin: 0.8 mg/dL (ref 0.3–1.2)
Total Protein: 6.9 g/dL (ref 6.5–8.1)

## 2019-11-03 LAB — MAGNESIUM: Magnesium: 2.6 mg/dL — ABNORMAL HIGH (ref 1.7–2.4)

## 2019-11-03 LAB — PLATELET COUNT: Platelets: 162 10*3/uL (ref 150–400)

## 2019-11-03 LAB — PROTIME-INR
INR: 1.1 (ref 0.8–1.2)
INR: 1.6 — ABNORMAL HIGH (ref 0.8–1.2)
Prothrombin Time: 13.7 seconds (ref 11.4–15.2)
Prothrombin Time: 18.9 seconds — ABNORMAL HIGH (ref 11.4–15.2)

## 2019-11-03 LAB — APTT
aPTT: 72 seconds — ABNORMAL HIGH (ref 24–36)
aPTT: 34 seconds (ref 24–36)

## 2019-11-03 LAB — HEMOGLOBIN AND HEMATOCRIT, BLOOD
HCT: 25.1 % — ABNORMAL LOW (ref 39.0–52.0)
Hemoglobin: 8.6 g/dL — ABNORMAL LOW (ref 13.0–17.0)

## 2019-11-03 SURGERY — CORONARY ARTERY BYPASS GRAFTING (CABG)
Anesthesia: General | Site: Leg Upper | Laterality: Right

## 2019-11-03 MED ORDER — MIDAZOLAM HCL 2 MG/2ML IJ SOLN: 2.0000 mg | INTRAMUSCULAR | Status: DC | PRN

## 2019-11-03 MED ORDER — LACTATED RINGERS IV SOLN: INTRAVENOUS | Status: DC | PRN

## 2019-11-03 MED ORDER — TRAMADOL HCL 50 MG PO TABS: 50.0000 mg | ORAL_TABLET | ORAL | Status: DC | PRN

## 2019-11-03 MED ORDER — LACTATED RINGERS IV SOLN: INTRAVENOUS | Status: DC

## 2019-11-03 MED ORDER — ASPIRIN EC 325 MG PO TBEC: 325.0000 mg | DELAYED_RELEASE_TABLET | Freq: Every day | ORAL | Status: DC

## 2019-11-03 MED ORDER — VANCOMYCIN HCL 1000 MG IV SOLR
INTRAVENOUS | Status: AC
  Filled 2019-11-03: qty 3000

## 2019-11-03 MED ORDER — STERILE WATER FOR INJECTION IJ SOLN
INTRAMUSCULAR | Status: DC | PRN
  Administered 2019-11-03: 1000 mL via INTRAVASCULAR
  Administered 2019-11-03: 10 mL via INTRAVASCULAR

## 2019-11-03 MED ORDER — BUPIVACAINE LIPOSOME 1.3 % IJ SUSP
INTRAMUSCULAR | Status: DC | PRN
  Administered 2019-11-03: 20 mL

## 2019-11-03 MED ORDER — INDOCYANINE GREEN 25 MG IV SOLR
INTRAVENOUS | Status: DC | PRN
  Administered 2019-11-03: .625 mg via INTRAVENOUS

## 2019-11-03 MED ORDER — ASPIRIN 81 MG PO CHEW: 324.0000 mg | CHEWABLE_TABLET | Freq: Every day | ORAL | Status: DC

## 2019-11-03 MED ORDER — NOREPINEPHRINE 4 MG/250ML-% IV SOLN: 0.0000 ug/min | INTRAVENOUS | Status: DC

## 2019-11-03 MED ORDER — BISACODYL 10 MG RE SUPP: 10.0000 mg | Freq: Every day | RECTAL | Status: DC

## 2019-11-03 MED ORDER — CHLORHEXIDINE GLUCONATE CLOTH 2 % EX PADS
6.0000 | MEDICATED_PAD | Freq: Every day | CUTANEOUS | Status: DC
  Administered 2019-11-03 – 2019-11-05 (×3): 6 via TOPICAL

## 2019-11-03 MED ORDER — VANCOMYCIN HCL 1000 MG IV SOLR
INTRAVENOUS | Status: DC | PRN
  Administered 2019-11-03 (×3): 1000 mg

## 2019-11-03 MED ORDER — ONDANSETRON HCL 4 MG/2ML IJ SOLN
4.0000 mg | Freq: Four times a day (QID) | INTRAMUSCULAR | Status: DC | PRN
  Administered 2019-11-05: 4 mg via INTRAVENOUS
  Filled 2019-11-03 (×2): qty 2

## 2019-11-03 MED ORDER — HEPARIN SODIUM (PORCINE) 1000 UNIT/ML IJ SOLN
INTRAMUSCULAR | Status: DC | PRN
  Administered 2019-11-03: 28000 [IU] via INTRAVENOUS

## 2019-11-03 MED ORDER — SODIUM CHLORIDE 0.9 % IV SOLN
1.5000 g | Freq: Two times a day (BID) | INTRAVENOUS | Status: AC
  Administered 2019-11-03 – 2019-11-05 (×3): 1.5 g via INTRAVENOUS
  Filled 2019-11-03 (×4): qty 1.5

## 2019-11-03 MED ORDER — BUPIVACAINE LIPOSOME 1.3 % IJ SUSP
20.0000 mL | Freq: Once | INTRAMUSCULAR | Status: DC
  Filled 2019-11-03: qty 20

## 2019-11-03 MED ORDER — PANTOPRAZOLE SODIUM 40 MG PO TBEC
40.0000 mg | DELAYED_RELEASE_TABLET | Freq: Every day | ORAL | Status: DC
  Administered 2019-11-05 – 2019-11-07 (×3): 40 mg via ORAL
  Filled 2019-11-03 (×3): qty 1

## 2019-11-03 MED ORDER — DEXMEDETOMIDINE HCL IN NACL 400 MCG/100ML IV SOLN: 0.0000 ug/kg/h | INTRAVENOUS | Status: DC

## 2019-11-03 MED ORDER — FENTANYL CITRATE (PF) 250 MCG/5ML IJ SOLN
INTRAMUSCULAR | Status: AC
  Filled 2019-11-03: qty 10

## 2019-11-03 MED ORDER — FENTANYL CITRATE (PF) 250 MCG/5ML IJ SOLN
INTRAMUSCULAR | Status: DC | PRN
  Administered 2019-11-03: 150 ug via INTRAVENOUS
  Administered 2019-11-03: 100 ug via INTRAVENOUS
  Administered 2019-11-03: 50 ug via INTRAVENOUS
  Administered 2019-11-03: 250 ug via INTRAVENOUS
  Administered 2019-11-03 (×2): 100 ug via INTRAVENOUS
  Administered 2019-11-03: 350 ug via INTRAVENOUS
  Administered 2019-11-03: 150 ug via INTRAVENOUS

## 2019-11-03 MED ORDER — FAMOTIDINE IN NACL 20-0.9 MG/50ML-% IV SOLN
20.0000 mg | Freq: Two times a day (BID) | INTRAVENOUS | Status: AC
  Administered 2019-11-03 (×2): 20 mg via INTRAVENOUS
  Filled 2019-11-03: qty 50

## 2019-11-03 MED ORDER — EPINEPHRINE HCL 5 MG/250ML IV SOLN IN NS: 0.5000 ug/min | INTRAVENOUS | Status: DC

## 2019-11-03 MED ORDER — MAGNESIUM SULFATE 4 GM/100ML IV SOLN
4.0000 g | Freq: Once | INTRAVENOUS | Status: AC
  Administered 2019-11-03: 4 g via INTRAVENOUS
  Filled 2019-11-03: qty 100

## 2019-11-03 MED ORDER — SODIUM CHLORIDE 0.9% FLUSH
10.0000 mL | Freq: Two times a day (BID) | INTRAVENOUS | Status: DC
  Administered 2019-11-03 – 2019-11-06 (×5): 10 mL

## 2019-11-03 MED ORDER — ACETAMINOPHEN 160 MG/5ML PO SOLN: 650.0000 mg | Freq: Once | ORAL | Status: AC

## 2019-11-03 MED ORDER — LACTATED RINGERS IV SOLN: 500.0000 mL | Freq: Once | INTRAVENOUS | Status: DC | PRN

## 2019-11-03 MED ORDER — NON FORMULARY
Status: DC | PRN
  Administered 2019-11-03: 20 mL via INTRAVASCULAR

## 2019-11-03 MED ORDER — MORPHINE SULFATE (PF) 2 MG/ML IV SOLN
1.0000 mg | INTRAVENOUS | Status: DC | PRN
  Administered 2019-11-04: 2 mg via INTRAVENOUS
  Filled 2019-11-03: qty 1

## 2019-11-03 MED ORDER — FENTANYL CITRATE (PF) 250 MCG/5ML IJ SOLN
INTRAMUSCULAR | Status: AC
  Filled 2019-11-03: qty 20

## 2019-11-03 MED ORDER — DOCUSATE SODIUM 100 MG PO CAPS
200.0000 mg | ORAL_CAPSULE | Freq: Every day | ORAL | Status: DC
  Administered 2019-11-04 – 2019-11-07 (×4): 200 mg via ORAL
  Filled 2019-11-03 (×4): qty 2

## 2019-11-03 MED ORDER — BUPIVACAINE HCL (PF) 0.5 % IJ SOLN
INTRAMUSCULAR | Status: AC
  Filled 2019-11-03: qty 30

## 2019-11-03 MED ORDER — DEXTROSE 50 % IV SOLN: 0.0000 mL | INTRAVENOUS | Status: DC | PRN

## 2019-11-03 MED ORDER — SODIUM CHLORIDE 0.9% FLUSH: 10.0000 mL | INTRAVENOUS | Status: DC | PRN

## 2019-11-03 MED ORDER — ROCURONIUM BROMIDE 10 MG/ML (PF) SYRINGE
PREFILLED_SYRINGE | INTRAVENOUS | Status: AC
  Filled 2019-11-03: qty 10

## 2019-11-03 MED ORDER — SODIUM CHLORIDE 0.9% FLUSH
3.0000 mL | Freq: Two times a day (BID) | INTRAVENOUS | Status: DC
  Administered 2019-11-04 – 2019-11-07 (×6): 3 mL via INTRAVENOUS

## 2019-11-03 MED ORDER — ALBUMIN HUMAN 5 % IV SOLN: INTRAVENOUS | Status: DC | PRN

## 2019-11-03 MED ORDER — CHLORHEXIDINE GLUCONATE 0.12% ORAL RINSE (MEDLINE KIT)
15.0000 mL | Freq: Two times a day (BID) | OROMUCOSAL | Status: DC
  Administered 2019-11-03 – 2019-11-04 (×2): 15 mL via OROMUCOSAL

## 2019-11-03 MED ORDER — PHENYLEPHRINE 40 MCG/ML (10ML) SYRINGE FOR IV PUSH (FOR BLOOD PRESSURE SUPPORT)
PREFILLED_SYRINGE | INTRAVENOUS | Status: DC | PRN
  Administered 2019-11-03: 40 ug via INTRAVENOUS

## 2019-11-03 MED ORDER — BUPIVACAINE HCL (PF) 0.5 % IJ SOLN
INTRAMUSCULAR | Status: DC | PRN
  Administered 2019-11-03: 30 mL

## 2019-11-03 MED ORDER — PHENYLEPHRINE 40 MCG/ML (10ML) SYRINGE FOR IV PUSH (FOR BLOOD PRESSURE SUPPORT)
PREFILLED_SYRINGE | INTRAVENOUS | Status: AC
  Filled 2019-11-03: qty 10

## 2019-11-03 MED ORDER — METOPROLOL TARTRATE 25 MG/10 ML ORAL SUSPENSION
12.5000 mg | Freq: Two times a day (BID) | ORAL | Status: DC
  Filled 2019-11-03: qty 5

## 2019-11-03 MED ORDER — CHLORHEXIDINE GLUCONATE 0.12 % MT SOLN
15.0000 mL | OROMUCOSAL | Status: AC
  Administered 2019-11-03: 15 mL via OROMUCOSAL
  Filled 2019-11-03: qty 15

## 2019-11-03 MED ORDER — NITROGLYCERIN IN D5W 200-5 MCG/ML-% IV SOLN: 0.0000 ug/min | INTRAVENOUS | Status: DC

## 2019-11-03 MED ORDER — ALBUMIN HUMAN 5 % IV SOLN
250.0000 mL | INTRAVENOUS | Status: AC | PRN
  Administered 2019-11-03 (×3): 12.5 g via INTRAVENOUS
  Filled 2019-11-03 (×2): qty 250

## 2019-11-03 MED ORDER — ISOSORBIDE MONONITRATE ER 30 MG PO TB24: 30.0000 mg | ORAL_TABLET | Freq: Every day | ORAL | Status: DC

## 2019-11-03 MED ORDER — MIDAZOLAM HCL (PF) 10 MG/2ML IJ SOLN
INTRAMUSCULAR | Status: AC
  Filled 2019-11-03: qty 2

## 2019-11-03 MED ORDER — SODIUM CHLORIDE 0.9 % IV SOLN
250.0000 mL | INTRAVENOUS | Status: DC
  Administered 2019-11-04: 250 mL via INTRAVENOUS

## 2019-11-03 MED ORDER — ACETAMINOPHEN 650 MG RE SUPP
650.0000 mg | Freq: Once | RECTAL | Status: AC
  Administered 2019-11-03: 650 mg via RECTAL

## 2019-11-03 MED ORDER — METOPROLOL TARTRATE 12.5 MG HALF TABLET
12.5000 mg | ORAL_TABLET | Freq: Two times a day (BID) | ORAL | Status: DC
  Administered 2019-11-04 (×2): 12.5 mg via ORAL
  Filled 2019-11-03 (×2): qty 1

## 2019-11-03 MED ORDER — SODIUM CHLORIDE 0.9 % IV SOLN: INTRAVENOUS | Status: DC

## 2019-11-03 MED ORDER — METOPROLOL TARTRATE 5 MG/5ML IV SOLN: 2.5000 mg | INTRAVENOUS | Status: DC | PRN

## 2019-11-03 MED ORDER — MIDAZOLAM HCL 5 MG/5ML IJ SOLN
INTRAMUSCULAR | Status: DC | PRN
  Administered 2019-11-03 (×3): 2 mg via INTRAVENOUS
  Administered 2019-11-03: 3 mg via INTRAVENOUS
  Administered 2019-11-03: 1 mg via INTRAVENOUS

## 2019-11-03 MED ORDER — 0.9 % SODIUM CHLORIDE (POUR BTL) OPTIME
TOPICAL | Status: DC | PRN
  Administered 2019-11-03: 5000 mL

## 2019-11-03 MED ORDER — ACETAMINOPHEN 500 MG PO TABS
1000.0000 mg | ORAL_TABLET | Freq: Four times a day (QID) | ORAL | Status: DC
  Administered 2019-11-03 – 2019-11-06 (×7): 1000 mg via ORAL
  Filled 2019-11-03 (×9): qty 2

## 2019-11-03 MED ORDER — VANCOMYCIN HCL IN DEXTROSE 1-5 GM/200ML-% IV SOLN
1000.0000 mg | Freq: Once | INTRAVENOUS | Status: AC
  Administered 2019-11-03: 1000 mg via INTRAVENOUS
  Filled 2019-11-03: qty 200

## 2019-11-03 MED ORDER — POTASSIUM CHLORIDE 10 MEQ/50ML IV SOLN
10.0000 meq | INTRAVENOUS | Status: AC
  Administered 2019-11-03 (×3): 10 meq via INTRAVENOUS

## 2019-11-03 MED ORDER — PROTAMINE SULFATE 10 MG/ML IV SOLN
INTRAVENOUS | Status: DC | PRN
  Administered 2019-11-03: 260 mg via INTRAVENOUS

## 2019-11-03 MED ORDER — SODIUM CHLORIDE 0.9% FLUSH: 3.0000 mL | INTRAVENOUS | Status: DC | PRN

## 2019-11-03 MED ORDER — CHLORHEXIDINE GLUCONATE 0.12 % MT SOLN
OROMUCOSAL | Status: AC
  Filled 2019-11-03: qty 15

## 2019-11-03 MED ORDER — PROPOFOL 10 MG/ML IV BOLUS
INTRAVENOUS | Status: AC
  Filled 2019-11-03: qty 20

## 2019-11-03 MED ORDER — STERILE WATER FOR INJECTION IJ SOLN
INTRAMUSCULAR | Status: AC
  Filled 2019-11-03: qty 10

## 2019-11-03 MED ORDER — SODIUM CHLORIDE 0.9 % IV SOLN
INTRAVENOUS | Status: DC | PRN
  Administered 2019-11-03: 750 mg via INTRAVENOUS

## 2019-11-03 MED ORDER — INSULIN REGULAR(HUMAN) IN NACL 100-0.9 UT/100ML-% IV SOLN: INTRAVENOUS | Status: DC

## 2019-11-03 MED ORDER — ROCURONIUM BROMIDE 10 MG/ML (PF) SYRINGE
PREFILLED_SYRINGE | INTRAVENOUS | Status: DC | PRN
  Administered 2019-11-03 (×2): 50 mg via INTRAVENOUS
  Administered 2019-11-03: 30 mg via INTRAVENOUS
  Administered 2019-11-03: 50 mg via INTRAVENOUS
  Administered 2019-11-03: 100 mg via INTRAVENOUS

## 2019-11-03 MED ORDER — NITROGLYCERIN IN D5W 200-5 MCG/ML-% IV SOLN: 7.0000 ug/min | INTRAVENOUS | Status: DC

## 2019-11-03 MED ORDER — ACETAMINOPHEN 160 MG/5ML PO SOLN: 1000.0000 mg | Freq: Four times a day (QID) | ORAL | Status: DC

## 2019-11-03 MED ORDER — HEPARIN SODIUM (PORCINE) 1000 UNIT/ML IJ SOLN
INTRAMUSCULAR | Status: AC
  Filled 2019-11-03: qty 1

## 2019-11-03 MED ORDER — BISACODYL 5 MG PO TBEC
10.0000 mg | DELAYED_RELEASE_TABLET | Freq: Every day | ORAL | Status: DC
  Administered 2019-11-04 – 2019-11-06 (×3): 10 mg via ORAL
  Filled 2019-11-03 (×4): qty 2

## 2019-11-03 MED ORDER — SODIUM CHLORIDE 0.45 % IV SOLN: INTRAVENOUS | Status: DC | PRN

## 2019-11-03 MED ORDER — OXYCODONE HCL 5 MG PO TABS
5.0000 mg | ORAL_TABLET | ORAL | Status: DC | PRN
  Administered 2019-11-04 – 2019-11-06 (×3): 10 mg via ORAL
  Filled 2019-11-03 (×3): qty 2

## 2019-11-03 MED ORDER — ORAL CARE MOUTH RINSE
15.0000 mL | OROMUCOSAL | Status: DC
  Administered 2019-11-03 (×2): 15 mL via OROMUCOSAL

## 2019-11-03 MED ORDER — PROPOFOL 10 MG/ML IV BOLUS
INTRAVENOUS | Status: DC | PRN
  Administered 2019-11-03: 20 mg via INTRAVENOUS
  Administered 2019-11-03: 30 mg via INTRAVENOUS

## 2019-11-03 MED ORDER — HEMOSTATIC AGENTS (NO CHARGE) OPTIME
TOPICAL | Status: DC | PRN
  Administered 2019-11-03 (×5): 1 via TOPICAL

## 2019-11-03 MED FILL — Magnesium Sulfate Inj 50%: INTRAMUSCULAR | Qty: 10 | Status: AC

## 2019-11-03 MED FILL — Heparin Sodium (Porcine) Inj 1000 Unit/ML: INTRAMUSCULAR | Qty: 30 | Status: AC

## 2019-11-03 MED FILL — Potassium Chloride Inj 2 mEq/ML: INTRAVENOUS | Qty: 40 | Status: AC

## 2019-11-03 SURGICAL SUPPLY — 127 items
ADAPTER CARDIO PERF ANTE/RETRO (ADAPTER) ×5 IMPLANT
ADH SKN CLS APL DERMABOND .7 (GAUZE/BANDAGES/DRESSINGS) ×16
ADPR PRFSN 84XANTGRD RTRGD (ADAPTER) ×4
APPLIER CLIP 9.375 SM OPEN (CLIP)
APR CLP SM 9.3 20 MLT OPN (CLIP)
BAG DECANTER FOR FLEXI CONT (MISCELLANEOUS) ×5 IMPLANT
BASKET HEART (ORDER IN 25'S) (MISCELLANEOUS) ×1
BASKET HEART (ORDER IN 25S) (MISCELLANEOUS) ×4 IMPLANT
BATTERY MAXDRIVER (MISCELLANEOUS) ×1 IMPLANT
BLADE CLIPPER SURG (BLADE) ×5 IMPLANT
BLADE STERNUM SYSTEM 6 (BLADE) ×5 IMPLANT
BLADE SURG 10 STRL SS (BLADE) ×1 IMPLANT
BLADE SURG 15 STRL LF DISP TIS (BLADE) ×4 IMPLANT
BLADE SURG 15 STRL SS (BLADE)
BNDG ELASTIC 4X5.8 VLCR STR LF (GAUZE/BANDAGES/DRESSINGS) ×6 IMPLANT
BNDG ELASTIC 6X5.8 VLCR STR LF (GAUZE/BANDAGES/DRESSINGS) ×5 IMPLANT
BNDG GAUZE ELAST 4 BULKY (GAUZE/BANDAGES/DRESSINGS) ×6 IMPLANT
CANISTER SUCT 3000ML PPV (MISCELLANEOUS) ×5 IMPLANT
CANNULA NON VENT 20FR 12 (CANNULA) ×1 IMPLANT
CATH CPB KIT HENDRICKSON (MISCELLANEOUS) ×5 IMPLANT
CATH RETROPLEGIA CORONARY 14FR (CATHETERS) ×1 IMPLANT
CATH ROBINSON RED A/P 18FR (CATHETERS) ×11 IMPLANT
CLIP APPLIE 9.375 SM OPEN (CLIP) ×4 IMPLANT
CLIP FOGARTY SPRING 6M (CLIP) ×1 IMPLANT
CLIP RETRACTION 3.0MM CORONARY (MISCELLANEOUS) ×5 IMPLANT
CLIP VESOCCLUDE MED 24/CT (CLIP) IMPLANT
CLIP VESOCCLUDE SM WIDE 24/CT (CLIP) ×2 IMPLANT
CONN ST 1/4X3/8  BEN (MISCELLANEOUS) ×2
CONN ST 1/4X3/8 BEN (MISCELLANEOUS) IMPLANT
COVER MAYO STAND STRL (DRAPES) ×6 IMPLANT
DERMABOND ADVANCED (GAUZE/BANDAGES/DRESSINGS) ×4
DERMABOND ADVANCED .7 DNX12 (GAUZE/BANDAGES/DRESSINGS) ×4 IMPLANT
DRAIN CHANNEL 28F RND 3/8 FF (WOUND CARE) ×15 IMPLANT
DRAPE CARDIOVASCULAR INCISE (DRAPES) ×5
DRAPE EXTREMITY T 121X128X90 (DISPOSABLE) ×1 IMPLANT
DRAPE HALF SHEET 40X57 (DRAPES) ×5 IMPLANT
DRAPE SLUSH/WARMER DISC (DRAPES) ×5 IMPLANT
DRAPE SRG 135X102X78XABS (DRAPES) ×4 IMPLANT
DRSG AQUACEL AG ADV 3.5X14 (GAUZE/BANDAGES/DRESSINGS) ×5 IMPLANT
ELECT BLADE 4.0 EZ CLEAN MEGAD (MISCELLANEOUS) ×10
ELECT CAUTERY BLADE 6.4 (BLADE) ×5 IMPLANT
ELECT REM PT RETURN 9FT ADLT (ELECTROSURGICAL) ×10
ELECTRODE BLDE 4.0 EZ CLN MEGD (MISCELLANEOUS) IMPLANT
ELECTRODE REM PT RTRN 9FT ADLT (ELECTROSURGICAL) ×8 IMPLANT
FELT TEFLON 1X6 (MISCELLANEOUS) ×10 IMPLANT
GAUZE SPONGE 4X4 12PLY STRL (GAUZE/BANDAGES/DRESSINGS) ×10 IMPLANT
GAUZE SPONGE 4X4 12PLY STRL LF (GAUZE/BANDAGES/DRESSINGS) ×3 IMPLANT
GEL ULTRASOUND 20GR AQUASONIC (MISCELLANEOUS) ×5 IMPLANT
GLOVE BIO SURGEON STRL SZ 6 (GLOVE) ×1 IMPLANT
GLOVE BIO SURGEON STRL SZ 6.5 (GLOVE) ×3 IMPLANT
GLOVE BIOGEL PI IND STRL 6.5 (GLOVE) IMPLANT
GLOVE BIOGEL PI IND STRL 7.5 (GLOVE) IMPLANT
GLOVE BIOGEL PI INDICATOR 6.5 (GLOVE) ×1
GLOVE BIOGEL PI INDICATOR 7.5 (GLOVE) ×2
GLOVE NEODERM STRL 7.5 LF PF (GLOVE) ×12 IMPLANT
GLOVE SURG NEODERM 7.5  LF PF (GLOVE) ×3
GLOVE SURG SS PI 7.5 STRL IVOR (GLOVE) ×3 IMPLANT
GOWN STRL REUS W/ TWL LRG LVL3 (GOWN DISPOSABLE) ×16 IMPLANT
GOWN STRL REUS W/TWL LRG LVL3 (GOWN DISPOSABLE) ×55
HEMOSTAT POWDER SURGIFOAM 1G (HEMOSTASIS) ×2 IMPLANT
INSERT FOGARTY XLG (MISCELLANEOUS) ×1 IMPLANT
KIT BASIN OR (CUSTOM PROCEDURE TRAY) ×5 IMPLANT
KIT SUCTION CATH 14FR (SUCTIONS) ×5 IMPLANT
KIT TURNOVER KIT B (KITS) ×5 IMPLANT
KIT VASOVIEW HEMOPRO 2 VH 4000 (KITS) ×5 IMPLANT
LOOP VESSEL MAXI BLUE (MISCELLANEOUS) ×1 IMPLANT
MARKER GRAFT CORONARY BYPASS (MISCELLANEOUS) ×15 IMPLANT
NDL 18GX1X1/2 (RX/OR ONLY) (NEEDLE) ×4 IMPLANT
NEEDLE 18GX1X1/2 (RX/OR ONLY) (NEEDLE) ×5 IMPLANT
NS IRRIG 1000ML POUR BTL (IV SOLUTION) ×5 IMPLANT
PACK E OPEN HEART (SUTURE) ×5 IMPLANT
PACK OPEN HEART (CUSTOM PROCEDURE TRAY) ×5 IMPLANT
PACK SPY-PHI (KITS) ×1 IMPLANT
PAD ARMBOARD 7.5X6 YLW CONV (MISCELLANEOUS) ×10 IMPLANT
PAD ELECT DEFIB RADIOL ZOLL (MISCELLANEOUS) ×5 IMPLANT
PENCIL BUTTON HOLSTER BLD 10FT (ELECTRODE) ×5 IMPLANT
PLATE STERNAL 2.3X208 14H 2-PK (Plate) ×1 IMPLANT
POSITIONER HEAD DONUT 9IN (MISCELLANEOUS) ×5 IMPLANT
POWDER SURGICEL 3.0 GRAM (HEMOSTASIS) ×1 IMPLANT
PUNCH AORTIC ROT 4.0MM RCL 40 (MISCELLANEOUS) ×1 IMPLANT
SCREW LOCKING TI 2.3X11MM (Screw) ×2 IMPLANT
SCREW LOCKING TI 2.3X13MM (Screw) ×4 IMPLANT
SCREW STERNAL 2.3X17MM (Screw) ×2 IMPLANT
SCREW STERNAL LOCK 2.3MM (Screw) ×4 IMPLANT
SEALANT SURG COSEAL 8ML (VASCULAR PRODUCTS) ×2 IMPLANT
SET CARDIOPLEGIA MPS 5001102 (MISCELLANEOUS) ×1 IMPLANT
SHEARS HARMONIC 9CM CVD (BLADE) ×5 IMPLANT
SHEARS HARMONIC STRL 23CM (MISCELLANEOUS) ×4 IMPLANT
SUT BONE WAX W31G (SUTURE) ×5 IMPLANT
SUT MNCRL AB 3-0 PS2 18 (SUTURE) ×11 IMPLANT
SUT PDS AB 1 CTX 36 (SUTURE) ×10 IMPLANT
SUT PROLENE 3 0 SH DA (SUTURE) ×6 IMPLANT
SUT PROLENE 4 0 SH DA (SUTURE) ×1 IMPLANT
SUT PROLENE 5 0 C 1 36 (SUTURE) IMPLANT
SUT PROLENE 6 0 C 1 30 (SUTURE) ×18 IMPLANT
SUT PROLENE 7 0 BV 1 (SUTURE) ×8 IMPLANT
SUT PROLENE 8 0 BV175 6 (SUTURE) ×4 IMPLANT
SUT PROLENE BLUE 7 0 (SUTURE) ×5 IMPLANT
SUT SILK  1 MH (SUTURE) ×1
SUT SILK 1 MH (SUTURE) IMPLANT
SUT SILK 2 0 SH (SUTURE) IMPLANT
SUT SILK 2 0 SH CR/8 (SUTURE) IMPLANT
SUT SILK 3 0 SH CR/8 (SUTURE) IMPLANT
SUT STEEL 6MS V (SUTURE) ×5 IMPLANT
SUT STEEL SZ 6 DBL 3X14 BALL (SUTURE) ×5 IMPLANT
SUT VIC AB 2-0 CT1 27 (SUTURE) ×10
SUT VIC AB 2-0 CT1 TAPERPNT 27 (SUTURE) IMPLANT
SUT VIC AB 2-0 CTX 27 (SUTURE) IMPLANT
SUT VIC AB 3-0 SH 27 (SUTURE)
SUT VIC AB 3-0 SH 27X BRD (SUTURE) IMPLANT
SUT VIC AB 3-0 X1 27 (SUTURE) IMPLANT
SYR 10ML LL (SYRINGE) ×2 IMPLANT
SYR 30ML LL (SYRINGE) ×5 IMPLANT
SYR 3ML LL SCALE MARK (SYRINGE) ×5 IMPLANT
SYR 50ML SLIP (SYRINGE) IMPLANT
SYSTEM SAHARA CHEST DRAIN ATS (WOUND CARE) ×5 IMPLANT
TAPE CLOTH SURG 4X10 WHT LF (GAUZE/BANDAGES/DRESSINGS) ×1 IMPLANT
TAPE PAPER 2X10 WHT MICROPORE (GAUZE/BANDAGES/DRESSINGS) ×1 IMPLANT
TOWEL GREEN STERILE (TOWEL DISPOSABLE) ×5 IMPLANT
TOWEL GREEN STERILE FF (TOWEL DISPOSABLE) ×5 IMPLANT
TRAY FOLEY SLVR 16FR TEMP STAT (SET/KITS/TRAYS/PACK) ×5 IMPLANT
TUBING ART PRESS 48 MALE/FEM (TUBING) ×2 IMPLANT
TUBING LAP HI FLOW INSUFFLATIO (TUBING) ×5 IMPLANT
UNDERPAD 30X30 (UNDERPADS AND DIAPERS) ×6 IMPLANT
WATER STERILE IRR 1000ML POUR (IV SOLUTION) ×2 IMPLANT
WATER STERILE IRR 1000ML UROMA (IV SOLUTION) ×1 IMPLANT
YANKAUER SUCT BULB TIP NO VENT (SUCTIONS) IMPLANT

## 2019-11-03 NOTE — Anesthesia Procedure Notes (Signed)
Arterial Line Insertion Start/End2/23/2021 7:10 AM Performed by: Laruth Bouchard., CRNA, CRNA  Patient location: Pre-op. Preanesthetic checklist: patient identified, IV checked, site marked, risks and benefits discussed, surgical consent, monitors and equipment checked, pre-op evaluation, timeout performed and anesthesia consent Lidocaine 1% used for infiltration and patient sedated Right, radial was placed Catheter size: 20 G Hand hygiene performed  and maximum sterile barriers used   Attempts: 1 Procedure performed without using ultrasound guided technique. Following insertion, dressing applied and Biopatch. Post procedure assessment: normal  Patient tolerated the procedure well with no immediate complications.

## 2019-11-03 NOTE — Anesthesia Procedure Notes (Signed)
Procedure Name: Intubation Date/Time: 11/03/2019 7:50 AM Performed by: Trinna Post., CRNA Pre-anesthesia Checklist: Patient identified, Emergency Drugs available, Suction available, Patient being monitored and Timeout performed Patient Re-evaluated:Patient Re-evaluated prior to induction Oxygen Delivery Method: Circle system utilized Preoxygenation: Pre-oxygenation with 100% oxygen Induction Type: IV induction Ventilation: Mask ventilation without difficulty Laryngoscope Size: Mac and 4 Grade View: Grade I Tube type: Oral Tube size: 8.0 mm Number of attempts: 1 Airway Equipment and Method: Stylet Placement Confirmation: ETT inserted through vocal cords under direct vision,  positive ETCO2 and breath sounds checked- equal and bilateral Secured at: 22 cm Tube secured with: Tape Dental Injury: Teeth and Oropharynx as per pre-operative assessment

## 2019-11-03 NOTE — Progress Notes (Signed)
Initiated weaning protocol 40%and rate of 4.

## 2019-11-03 NOTE — Progress Notes (Signed)
RT performed SIMV wean protocol on pt and pt NIF was -15 and VC 600. No cuff leak heard, MD said to give pt more time to wake up and retry at a later time. Pt placed back on full support SIMV settings. RT will continue to monitor.

## 2019-11-03 NOTE — Progress Notes (Signed)
2nd step of weaning protocol initiated CP/PS 10/5

## 2019-11-03 NOTE — Anesthesia Procedure Notes (Signed)
Central Venous Catheter Insertion Performed by: Heather Roberts, MD, anesthesiologist Start/End2/23/2021 6:26 AM, 11/03/2019 6:36 AM Patient location: Pre-op. Preanesthetic checklist: patient identified, IV checked, site marked, risks and benefits discussed, surgical consent, monitors and equipment checked, pre-op evaluation, timeout performed and anesthesia consent Position: Trendelenburg Lidocaine 1% used for infiltration and patient sedated Hand hygiene performed , maximum sterile barriers used  and Seldinger technique used Catheter size: 9 Fr Total catheter length 8. PA cath was placed.Sheath introducer Swan type:thermodilution PA Cath depth:50 Procedure performed using ultrasound guided technique. Ultrasound Notes:anatomy identified, needle tip was noted to be adjacent to the nerve/plexus identified, no ultrasound evidence of intravascular and/or intraneural injection and image(s) printed for medical record Attempts: 1 Following insertion, line sutured and dressing applied. Post procedure assessment: free fluid flow, blood return through all ports and no air  Patient tolerated the procedure well with no immediate complications.

## 2019-11-03 NOTE — Progress Notes (Signed)
NIF-30 VC 900

## 2019-11-03 NOTE — Transfer of Care (Signed)
Immediate Anesthesia Transfer of Care Note  Patient: John May  Procedure(s) Performed: CORONARY ARTERY BYPASS GRAFTING (CABG) using LIMA to LAD(m); Free RIMA to OM1 and OM2; Left radial artery to PDA; Right Greater Saphenous vein endoscopic harvested: SVG to Diag1; SVG to distal LAD. (N/A Chest) RADIAL ARTERY HARVEST (Left Arm Lower) TRANSESOPHAGEAL ECHOCARDIOGRAM (TEE) (N/A ) INDOCYANINE GREEN FLUORESCENCE IMAGING (ICG) (N/A ) Endovein Harvest Of Greater Saphenous Vein of right LE. (Right Leg Upper)  Patient Location: PACU and ICU  Anesthesia Type:General  Level of Consciousness: sedated and Patient remains intubated per anesthesia plan  Airway & Oxygen Therapy: Patient remains intubated per anesthesia plan and Patient placed on Ventilator (see vital sign flow sheet for setting)  Post-op Assessment: Report given to RN and Post -op Vital signs reviewed and stable  Post vital signs: Reviewed and stable  Last Vitals:  Vitals Value Taken Time  BP    Temp    Pulse    Resp    SpO2      Last Pain:  Vitals:   11/03/19 0421  TempSrc: Oral  PainSc:          Complications: No apparent anesthesia complications

## 2019-11-03 NOTE — Discharge Summary (Signed)
Physician Discharge Summary  Patient ID: John May MRN: [id number] DOB/AGE: [birth year] 54 y.o.  Admit date: 10/31/2019 Discharge date: 11/09/2019  Admission Diagnoses: Coronary artery disease Acute NSTEMI Hypertension Diabetes mellitus, type 2 Dyslipidemia Obstructive sleep apnea   Discharge Diagnoses:   Coronary artery disease Acute NSTEMI Hypertension Diabetes mellitus, type 2 Dyslipidemia Obstructive sleep apnea S/P CABG x 6   Discharged Condition: good  History of Present Illness:     54 yo man with 20 yr h/o CAD s/p PCI in 2013 presented to the [facility] ED on 10/31/19 with unstable angina. He is known to have diffuse residual disease which has been managed medically.  Eval showed NSTEMI on this admission. He underwent LHC on 11/02/19 demonstrating multivessel CAD. Referred for CABG. Presently asymptomatic. Denies sx of heart failure.    Hospital Course:  John May remained stable following left heart catheterization. After pre-op evaluation he was considered to be an appropriate candidate for coronary artery bypass grafting.  He decided to proceed with surgery and was taken to the OR on 11/03/19 where CABG x 6 was performed without complication.  He separated from cardiopulmonary bypass without difficulty and was transferred to the cardiovascular ICU in stable condition. He remained hemodynamically stable and was extubated routinely. He was weaned from inotropic and vasopressor support with no difficulty. He was mobilized with physical therapy. He was transferred to [facility] progressive care on post-op day 3. The pacing wires and chest tubes were removed on post-op day 3.  The patient continued to make excellent progress.  He does have an expected acute blood loss anemia which is stabilized.  Most recent hemoglobin and hematocrit dated 11/06/1998 2110.2 and 30.9 respectively.  He has had some mild volume overloaded but responded well to diuretics.  His renal function has remained within  normal limits.  His blood sugars have been under good control.  Incisions are noted to be healing well without evidence of infection.  He is tolerating diet and other routine activities commensurate for level of postoperative convalescence.  Oxygen has been weaned and he maintains good saturations on room air.  He has continued to maintain sinus rhythm.  At the time of discharge the patient is felt to be quite stable.  Consults: None  Significant Diagnostic Studies:  LEFT HEART CATH AND CORONARY ANGIOGRAPHY  Conclusion    1st Mrg lesion is 90% stenosed.  2nd Mrg lesion is 90% stenosed.  Mid Cx lesion is 90% stenosed.  1st Diag lesion is 95% stenosed.  Mid LAD lesion is 99% stenosed.  Dist LAD lesion is 99% stenosed.  Dist RCA lesion is 100% stenosed.  Prox RCA to Mid RCA lesion is 99% stenosed.  Prox RCA lesion is 50% stenosed.   Severe triple vessel CAD  Recommendations: Severe multi-vessel CAD. Will consult CT surgery for consideration of CABG which appears to be the best method revascularization given the complexity of his disease.      ECHOCARDIOGRAM REPORT       Patient Name:  John May Date of Exam: 11/01/2019  Medical Rec #: [id number]  Height:    67.0 in  Accession #:  [id number] Weight:    200.2 lb  Date of Birth: May 09, 1966  BSA:     2.023 m  Patient Age:  54 years  BP:      130/88 mmHg  Patient Gender: M      HR:      85 bpm.  Exam Location: Inpatient   Procedure: 2D  Echo, Color Doppler and Cardiac Doppler   Indications:  Acute myocardial Infarction    History:    Patient has prior history of Echocardiogram examinations,  most         recent 01/10/2010. Signs/Symptoms:Chest Pain.    Sonographer:  Merrie Roof RDCS  Referring Phys: [phone] ADAM S BARNETT   IMPRESSIONS    1. Left ventricular ejection fraction, by estimation, is 55%. The left  ventricle has normal function. The left  ventricle demonstrates regional  wall motion abnormalities (see scoring diagram/findings for description).  There is mild left ventricular  hypertrophy. Left ventricular diastolic parameters are indeterminate.  2. Right ventricular systolic function is normal. The right ventricular  size is normal. Tricuspid regurgitation signal is inadequate for assessing  PA pressure.  3. The mitral valve is grossly normal. Trivial mitral valve  regurgitation.  4. The aortic valve is tricuspid. Aortic valve regurgitation is not  visualized.   FINDINGS  Left Ventricle: Left ventricular ejection fraction, by estimation, is  55%. The left ventricle has normal function. The left ventricle  demonstrates regional wall motion abnormalities. The left ventricular  internal cavity size was normal in size. There is  mild left ventricular hypertrophy. Left ventricular diastolic parameters  are indeterminate.     LV Wall Scoring:  The apical septal segment, apical inferior segment, and apex are  hypokinetic.  The entire anterior wall, antero-lateral wall, inferior wall, apical  lateral  segment, mid inferoseptal segment, and basal inferoseptal segment are  normal.   Right Ventricle: The right ventricular size is normal. No increase in  right ventricular wall thickness. Right ventricular systolic function is  normal. Tricuspid regurgitation signal is inadequate for assessing PA  pressure.   Left Atrium: Left atrial size was normal in size.   Right Atrium: Right atrial size was normal in size.   Pericardium: There is no evidence of pericardial effusion.   Mitral Valve: The mitral valve is grossly normal. Trivial mitral valve  regurgitation.   Tricuspid Valve: The tricuspid valve is grossly normal. Tricuspid valve  regurgitation is trivial.   Aortic Valve: The aortic valve is tricuspid. Aortic valve regurgitation is  not visualized. Mild aortic valve annular calcification.   Pulmonic Valve:  The pulmonic valve was grossly normal. Pulmonic valve  regurgitation is not visualized.   Aorta: The aortic root was not well visualized.   Venous: The inferior vena cava was not well visualized.   Treatments:   CARDIOTHORACIC SURGERY OPERATIVE NOTE  Date of Procedure:    11/03/2019  Preoperative Diagnosis:      Severe 3-vessel Coronary Artery Disease with NSTEMI, h/o PCI in 2013  Postoperative Diagnosis:    Same  Procedure:        Coronary Artery Bypass Grafting x 6              Left Internal Mammary Artery to mid Left Anterior Descending Coronary Artery; Left radial artery Graft to Posterior Descending Coronary Artery; free right IMA graft to1st and 2nd Obtuse Marginal Branches of Left Circumflex Coronary Artery as a sequenced graft; Sapheonous Vein Graft to apical portion of LAD and 1st Diagonal Branch Coronary Artery as a sequenced graft; Endoscopic Vein Harvest from rightThigh and Lower Leg; open left radial artery harvesting; bilateral IMA harvesting Multilevel rib block with exparel solution Rigid sternal reconstruction with linear plating system Completion graft surveillance with indocyanine green fluorescence imaging (SPY)  Surgeon:        B. Murvin Natal, MD  Assistant:  Gaynelle Arabian PA-C  Anesthesia:    get  Operative Findings: ? preserved left ventricular systolic function ? good quality internal mammary artery conduits ? good quality radial artery and saphenous vein conduits ? fair quality target vessels for grafting due to diffuse plaque    BRIEF CLINICAL NOTE AND INDICATIONS FOR SURGERY  54 yo male with 20 year h/o CAD s/p PCI in 2013 of RCA presented with several days worsening angina. Found to have evidence for NSTEMI on evaluation. LHC showed severe, diffuse, multivessel CAD; referred for CABG as best therapy. Now taken to the OR having been thoroughly assessed as a good candidate for surgery with low estimated risk for  morbidity/mortality.   Discharge Exam: Blood pressure (!) 148/97, pulse (!) 103, temperature 99.5 F (37.5 C), temperature source Oral, resp. rate 17, height 5\' 7"  (1.702 m), weight 89.8 kg, SpO2 98 %.     General appearance: alert, cooperative and no distress Heart: regular rate and rhythm Lungs: clear to auscultation bilaterally Abdomen: benign Extremities: no edema Wound: incis healing well, left hand N/V intact Disposition: Discharge disposition: 01-Home or Self Care       Discharge Instructions    Discharge patient   Complete by: As directed    Discharge disposition: 01-Home or Self Care   Discharge patient date: 11/07/2019     Allergies as of 11/07/2019      Reactions   Bee Venom Swelling   Food Allergy Formula Swelling   "Crab in stuffing; I eat shrimp and I don't have problems"   Crab [shellfish Allergy]    Nutritional Supplements Swelling   unknown      Medication List    STOP taking these medications   amLODipine 10 MG tablet Commonly known as: NORVASC   hydrochlorothiazide 25 MG tablet Commonly known as: HYDRODIURIL   lisinopril 40 MG tablet Commonly known as: ZESTRIL   metoprolol succinate 25 MG 24 hr tablet Commonly known as: Toprol XL   nitroGLYCERIN 0.4 MG SL tablet Commonly known as: NITROSTAT   sildenafil 20 MG tablet Commonly known as: REVATIO     TAKE these medications   aspirin EC 81 MG tablet Take 81 mg by mouth daily.   captopril 12.5 MG tablet Commonly known as: CAPOTEN Take 0.5 tablets (6.25 mg total) by mouth 3 (three) times daily.   clopidogrel 75 MG tablet Commonly known as: PLAVIX Take 1 tablet (75 mg total) by mouth daily.   ezetimibe 10 MG tablet Commonly known as: ZETIA TAKE 1 TABLET BY MOUTH EVERY DAY   isosorbide dinitrate 10 MG tablet Commonly known as: ISORDIL Take 1 tablet (10 mg total) by mouth 3 (three) times daily.   Jardiance 25 MG Tabs tablet Generic drug: empagliflozin Take 25 mg by mouth  daily.   metFORMIN 1000 MG tablet Commonly known as: GLUCOPHAGE Take 1,000 mg by mouth 2 (two) times daily.   metoprolol tartrate 50 MG tablet Commonly known as: LOPRESSOR Take 1 tablet (50 mg total) by mouth 2 (two) times daily.   pioglitazone 30 MG tablet Commonly known as: ACTOS Take 30 mg by mouth daily.   rosuvastatin 40 MG tablet Commonly known as: CRESTOR TAKE 1 TABLET BY MOUTH EVERY DAY   traMADol 50 MG tablet Commonly known as: ULTRAM Take 1 tablet (50 mg total) by mouth every 6 (six) hours as needed for up to 7 days for moderate pain.   Vitamin D (Ergocalciferol) 1.25 MG (50000 UNIT) Caps capsule Commonly known as: DRISDOL Take 50,000 Units  by mouth once a week. Sundays      Follow-up Information    Linden Dolin, MD. Go on 11/16/2019.   Specialty: Cardiothoracic Surgery Why: You have an appointment with Dr. Vickey Sages on Monday 11/16/19 at 12:30pm.  Please arrive 30 minutes early for a chest xray to be performed by [facility] located on the 1st floor of the same building Contact information: [address] [place] [address] [phone]        Ronney Asters, NP. Go on 11/23/2019.   Specialty: Cardiology Why: You have a cardiology follow up appointment with Edd Fabian, NP on Monday, 11/23/19 at 2:30pm.   Contact information: [address] [place] [address] [phone]          The patient has been discharged on:   1.Beta Blocker:  Yes [ x  ]                              No   [   ]                              If No, reason:  2.Ace Inhibitor/ARB: Yes [ y  ]                                     No  [    ]                                     If No, reason:  3.Statin:   Yes [x   ]                  No  [   ]                  If No, reason:  4.Ecasa:  Yes  [ x  ]                  No   [   ]                  If No, reason:   Signed: Rowe Clack, PA-C 11/09/2019, 11:06 AM

## 2019-11-03 NOTE — H&P (Signed)
History and Physical Interval Note:  11/03/2019 7:00 AM  John May  has presented today for surgery, with the diagnosis of CAD.  The various methods of treatment have been discussed with the patient and family. After consideration of risks, benefits and other options for treatment, the patient has consented to  Procedure(s) with comments: CORONARY ARTERY BYPASS GRAFTING (CABG) (N/A) - BILATERAL IMA RADIAL ARTERY HARVEST (Bilateral) TRANSESOPHAGEAL ECHOCARDIOGRAM (TEE) (N/A) INDOCYANINE GREEN FLUORESCENCE IMAGING (ICG) (N/A) as a surgical intervention.  The patient's history has been reviewed, patient examined, no change in status, stable for surgery.  I have reviewed the patient's chart and labs.  Questions were answered to the patient's satisfaction.     Linden Dolin

## 2019-11-03 NOTE — Progress Notes (Signed)
  Echocardiogram Echocardiogram Transesophageal has been performed.  [name] 11/03/2019, 8:53 AM

## 2019-11-03 NOTE — Progress Notes (Signed)
RT note- SICU wean started at this time.

## 2019-11-03 NOTE — Brief Op Note (Addendum)
10/31/2019 - 11/03/2019  2:05 PM  PATIENT:  John May  54 y.o. male  PRE-OPERATIVE DIAGNOSIS:  CORONARY ARTERY DISEASE  POST-OPERATIVE DIAGNOSIS:  CORONARY ARTERY DISEASE  PROCEDURES:    -CORONARY ARTERY BYPASS GRAFTING x 6   LIMA-> Mid LAD  RIMA (free graft) ->OM1->OM2  SVG->D1-> Distal LAD  Left Radial Artery -> PDA  -INDOCYANINE GREEN FLUORESCENCE IMAGING (ICG) (N/A)  -Endoscopic SVG Harvest  -TRANSESOPHAGEAL ECHOCARDIOGRAM (TEE) (N/A)   SURGEON:  Linden Dolin, MD   PHYSICIAN ASSISTANT: Roddenberry  ANESTHESIA:   general  BLOOD ADMINISTERED:none  DRAINS: Bilateral pleural and mediastinal drains   LOCAL MEDICATIONS USED:  Bilateral intercostal Exparel  SPECIMEN:  No Specimen  DISPOSITION OF SPECIMEN:  N/A  COUNTS:  YES  DICTATION: .Dragon Dictation  PLAN OF CARE: Admit to inpatient   PATIENT DISPOSITION:  ICU - intubated and hemodynamically stable.   Delay start of Pharmacological VTE agent (>24hrs) due to surgical blood loss or risk of bleeding: yes  Agree with documentation as noted in addition to : rigid sternal reconstruction with linear plating system.  Amellia Panik Z. Vickey Sages, MD [phone]

## 2019-11-03 NOTE — Anesthesia Preprocedure Evaluation (Addendum)
Anesthesia Evaluation  Patient identified by MRN, date of birth, ID band Patient awake    Reviewed: NPO status , Patient's Chart, lab work & pertinent test results, reviewed documented beta blocker date and time   History of Anesthesia Complications Negative for: history of anesthetic complications  Airway Mallampati: II  TM Distance: >3 FB Neck ROM: Full    Dental  (+) Dental Advisory Given   Pulmonary sleep apnea and Continuous Positive Airway Pressure Ventilation , neg recent URI, former smoker,    breath sounds clear to auscultation       Cardiovascular hypertension, Pt. on medications and Pt. on home beta blockers + CAD, + Past MI and + Cardiac Stents   Rhythm:Regular     Neuro/Psych negative neurological ROS  negative psych ROS   GI/Hepatic negative GI ROS, Neg liver ROS,   Endo/Other  diabetes, Type 2  Renal/GU      Musculoskeletal   Abdominal   Peds  Hematology   Anesthesia Other Findings   Reproductive/Obstetrics                            Anesthesia Physical Anesthesia Plan  ASA: IV  Anesthesia Plan: General   Post-op Pain Management:    Induction: Intravenous  PONV Risk Score and Plan: 2 and Treatment may vary due to age or medical condition  Airway Management Planned: Oral ETT  Additional Equipment: Arterial line, CVP, PA Cath, TEE and Ultrasound Guidance Line Placement  Intra-op Plan:   Post-operative Plan: Post-operative intubation/ventilation  Informed Consent: I have reviewed the patients History and Physical, chart, labs and discussed the procedure including the risks, benefits and alternatives for the proposed anesthesia with the patient or authorized representative who has indicated his/her understanding and acceptance.     Dental advisory given  Plan Discussed with: CRNA and Surgeon  Anesthesia Plan Comments:         Anesthesia Quick  Evaluation

## 2019-11-03 NOTE — Progress Notes (Signed)
Pt transported to OR, Heparin drip stopped at 0530. Report called to Lonna Cobb still infusing. Patient belongings (including cell phone) bagged and labeled, left at [facility] nurses station

## 2019-11-03 NOTE — Op Note (Signed)
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, h/o PCI in 2013  Postoperative Diagnosis: Same  Procedure:    Coronary Artery Bypass Grafting x 6   Left Internal Mammary Artery to mid Left Anterior Descending Coronary Artery; Left radial artery Graft to Posterior Descending Coronary Artery; free right IMA graft to1st and 2nd Obtuse Marginal Branches of Left Circumflex Coronary Artery as a sequenced graft; Sapheonous Vein Graft to apical portion of LAD and 1st Diagonal Branch Coronary Artery as a sequenced graft; Endoscopic Vein Harvest from rightThigh and Lower Leg; open left radial artery harvesting; bilateral IMA harvesting Multilevel rib block with exparel solution Rigid sternal reconstruction with linear plating system Completion graft surveillance with indocyanine green fluorescence imaging (SPY)  Surgeon: B. Lorayne Marek, MD  Assistant: Gaynelle Arabian PA-C  Anesthesia: get  Operative Findings:  preserved left ventricular systolic function  good quality internal mammary artery conduits  good quality radial artery and saphenous vein conduits  fair quality target vessels for grafting due to diffuse plaque    BRIEF CLINICAL NOTE AND INDICATIONS FOR SURGERY  54 yo male with 20 year h/o CAD s/p PCI in 2013 of RCA presented with several days worsening angina. Found to have evidence for NSTEMI on evaluation. LHC showed severe, diffuse, multivessel CAD; referred for CABG as best therapy. Now taken to the OR having been thoroughly assessed as a good candidate for surgery with low estimated risk for morbidity/mortality.    DETAILS OF THE OPERATIVE PROCEDURE  Preparation:  The patient is brought to the operating room on the above mentioned date and central monitoring was established by the anesthesia team including placement of Swan-Ganz catheter and radial arterial line. The patient  is placed in the supine position on the operating table.  Intravenous antibiotics are administered. General endotracheal anesthesia is induced uneventfully. A Foley catheter is placed.  Baseline transesophageal echocardiogram was performed.  Findings were notable for preserved biventricular function and no significant valvular disorders.  The patient's chest, abdomen, both groins, left upper extremity, and both lower extremities are prepared and draped in a sterile manner. A time out procedure is performed.   Surgical Approach and Conduit Harvest:  A median sternotomy incision was performed and the left internal mammary artery is dissected from the chest wall and prepared for bypass grafting. The left internal mammary artery is notably good quality conduit. Simultaneously, the left radial artery is harvested in an open manner with intraoperative assessment of palmar oxygen saturations during simulated radial artery removal, which were preserved. After removal of the left radial, the incision is closed in layers, and the arm is tucked at the left side. Next, attention is turned to the right chest wall where the RIMA is harvested as a free graft due to operative plan to use the graft on the circumflex vessels. Simultaneously, the greater saphenous vein is obtained from the patient's right thigh using endoscopic vein harvest technique. The saphenous vein is notably good quality conduit. After removal of the saphenous vein, the small surgical incisions in the lower extremity are closed with absorbable suture. Following systemic heparinization, both internal mammary artery grafts were transected distally and treated with papaverine. Multilevel rib block was performed using liposomal bupivicaine (Exparel) solution.   Extracorporeal Cardiopulmonary Bypass and Myocardial Protection:  The pericardium is opened. The ascending aorta is normal in appearance. The ascending aorta and the right atrium are cannulated  for cardiopulmonary bypass.  Adequate heparinization is verified.  A retrograde cardioplegia cannula is placed through the right atrium into the coronary sinus.  The entire pre-bypass portion of the operation was notable for stable hemodynamics.  Cardiopulmonary bypass was begun and the surface of the heart is inspected. Distal target vessels are selected for coronary artery bypass grafting. A cardioplegia cannula is placed in the ascending aorta.  The patient is allowed to cool passively to 34 C systemic temperature.  The aortic cross clamp is applied and cold blood cardioplegia is delivered initially in an antegrade fashion through the aortic root.  Supplemental cardioplegia is given retrograde through the coronary sinus catheter.  Iced saline slush is applied for topical hypothermia.  The initial cardioplegic arrest is rapid with early diastolic arrest.  Repeat doses of cardioplegia are administered intermittently throughout the entire cross clamp portion of the operation through the aortic root,  through the coronary sinus catheter, and through subsequently placed vein grafts in order to maintain completely flat electrocardiogram.   Coronary Artery Bypass Grafting:   The posterior descending branch of the right coronary artery was grafted using the left radial artery  graft in an end-to-side fashion.  At the site of distal anastomosis the target vessel was fair quality and measured approximately 1.5 mm in diameter. Anastomotic patency and runoff was confirmed with indocyanine green fluorescence imaging (SPY).  The 1st and 2nd obtuse marginal branches of the left circumflex coronary artery were grafted using the free RIMA graft in a sequential fashion.  At the site of distal anastomoses the target vessels were both fair quality and measured approximately 1.5 mm in diameter.Anastomotic patency and runoff was confirmed with indocyanine green fluorescence imaging (SPY).  The  diagonal branch of the  left anterior descending coronary artery and the apical portion of the wrap-around LAD were grafted as a sequential graft with reversed saphenous vein graft.  At the site of distal anastomoses the target vessels were fair quality and measured approximately 1.5 mm in diameter. Anastomotic patency and runoff was confirmed with indocyanine green fluorescence imaging (SPY). The distal left anterior coronary artery was grafted with the left internal mammary artery in an end-to-side fashion.  At the site of distal anastomosis the target vessel was fair quality and measured approximately 1.5 mm in diameter.Anastomotic patency and runoff was confirmed with indocyanine green fluorescence imaging (SPY). All proximal vein graft anastomoses were placed directly to the ascending aorta prior to removal of the aortic cross clamp.  Deairing procedures were performed, and the aortic cross clamp was removed.  Procedure Completion:  All proximal and distal coronary anastomoses were inspected for hemostasis and appropriate graft orientation. Epicardial pacing wires are fixed to the right ventricular outflow tract and to the right atrial appendage. The patient is rewarmed to 37C temperature. The patient is weaned and disconnected from cardiopulmonary bypass.  The patient's rhythm at separation from bypass was NSR.  The patient was weaned from cardiopulmonary bypass without any inotropic support.  Followup transesophageal echocardiogram performed after separation from bypass revealed no changes from the preoperative exam.  The aortic and venous cannula were removed uneventfully. Protamine was administered to reverse the anticoagulation. The mediastinum and pleural space were inspected for hemostasis and irrigated with saline solution. The mediastinum and bilateral pleural spaces were drained using fluted chest tubes placed through separate stab incisions inferiorly.  The soft tissues anterior to the aorta were reapproximated  loosely. The sternum is closed by a rigid method of reconstruction with linear plating system on each side of the sternum followed by  double strength sternal wire cerclage. The soft tissues anterior to the sternum were closed in multiple layers and the skin is closed with a running subcuticular skin closure.  The post-bypass portion of the operation was notable for stable rhythm and hemodynamics.  No blood products were administered during the operation.   Disposition:  The patient tolerated the procedure well and is transported to the surgical intensive care in stable condition. There are no intraoperative complications. All sponge instrument and needle counts are verified correct at completion of the operation.    Brantley Fling, MD 11/03/2019 3:51 PM

## 2019-11-03 NOTE — Progress Notes (Signed)
   Mr. Klug has already gone to the OR for CABG - CT surgery to assume attending role post-op. Will continue to follow. Appreciate their assistance.  Chrystie Nose, MD, [facility], FACP  [place]  [facility]  Medical Director of the [facility]  Card[facility]ovascular Risk Reduction Clinic Diplomate of the American Board of Clinical Lipidology Attending Cardiologist  Direct Dial: [phone]  Fax: [phone]  Website:  [email or web address]

## 2019-11-03 NOTE — Procedures (Signed)
Extubation Procedure Note  Patient Details:   Name: John May DOB: Jan 27, 1966 MRN: [id number]   Airway Documentation:  Airway 8 mm (Active)  Secured at (cm) 21 cm 11/03/19 2044  Measured From Lips 11/03/19 2044  Secured Location Right 11/03/19 2044  Secured By Pink Tape 11/03/19 2044  Site Condition Dry 11/03/19 2044   Vent end date: 11/03/19 Vent end time: 2152   Evaluation  O2 sats: stable throughout Complications: No apparent complications Patient did tolerate procedure well. Bilateral Breath Sounds: Clear, Diminished   Yes  Pt extubated to 4 lpm n/c.  Pt was able to verbalize his name.  Patient was oriented to time and place.  RN is currently working with pt with incentive spirometer.  Letitia Caul [facility] 11/03/2019, 9:53 PM

## 2019-11-03 NOTE — Progress Notes (Signed)
      [address]       [place] [address]             [phone]      S/p CABG x 6  Intubated, starting to wake up  BP 103/75   Pulse 94   Temp (!) 95.7 F (35.4 C)   Resp 12   Ht 5\' 7"  (1.702 m)   Wt 90.3 kg   SpO2 100%   BMI 31.18 kg/m   CI 1.6 trending up  Intake/Output Summary (Last 24 hours) at 11/03/2019 1810 Last data filed at 11/03/2019 1600 Gross per 24 hour  Intake 4760.8 ml  Output 3080 ml  Net 1680.8 ml   Minimal CT output  Hct= 32, PLT 121K  Doing well early postop Weaning initiated  11/05/2019 C. Viviann Spare, MD [facility] [phone]

## 2019-11-04 ENCOUNTER — Inpatient Hospital Stay (HOSPITAL_COMMUNITY): Payer: Medicare Other

## 2019-11-04 ENCOUNTER — Encounter: Payer: Self-pay | Admitting: *Deleted

## 2019-11-04 DIAGNOSIS — Z951 Presence of aortocoronary bypass graft: Secondary | ICD-10-CM

## 2019-11-04 LAB — GLUCOSE, CAPILLARY
Glucose-Capillary: 100 mg/dL — ABNORMAL HIGH (ref 70–99)
Glucose-Capillary: 102 mg/dL — ABNORMAL HIGH (ref 70–99)
Glucose-Capillary: 106 mg/dL — ABNORMAL HIGH (ref 70–99)
Glucose-Capillary: 106 mg/dL — ABNORMAL HIGH (ref 70–99)
Glucose-Capillary: 107 mg/dL — ABNORMAL HIGH (ref 70–99)
Glucose-Capillary: 108 mg/dL — ABNORMAL HIGH (ref 70–99)
Glucose-Capillary: 109 mg/dL — ABNORMAL HIGH (ref 70–99)
Glucose-Capillary: 109 mg/dL — ABNORMAL HIGH (ref 70–99)
Glucose-Capillary: 111 mg/dL — ABNORMAL HIGH (ref 70–99)
Glucose-Capillary: 111 mg/dL — ABNORMAL HIGH (ref 70–99)
Glucose-Capillary: 114 mg/dL — ABNORMAL HIGH (ref 70–99)
Glucose-Capillary: 125 mg/dL — ABNORMAL HIGH (ref 70–99)
Glucose-Capillary: 127 mg/dL — ABNORMAL HIGH (ref 70–99)
Glucose-Capillary: 132 mg/dL — ABNORMAL HIGH (ref 70–99)
Glucose-Capillary: 139 mg/dL — ABNORMAL HIGH (ref 70–99)

## 2019-11-04 LAB — BASIC METABOLIC PANEL
Anion gap: 7 (ref 5–15)
Anion gap: 8 (ref 5–15)
BUN: 9 mg/dL (ref 6–20)
BUN: 7 mg/dL (ref 6–20)
CO2: 25 mmol/L (ref 22–32)
CO2: 26 mmol/L (ref 22–32)
Calcium: 7.1 mg/dL — ABNORMAL LOW (ref 8.9–10.3)
Calcium: 7.9 mg/dL — ABNORMAL LOW (ref 8.9–10.3)
Chloride: 108 mmol/L (ref 98–111)
Chloride: 104 mmol/L (ref 98–111)
Creatinine, Ser: 0.76 mg/dL (ref 0.61–1.24)
Creatinine, Ser: 0.88 mg/dL (ref 0.61–1.24)
GFR calc Af Amer: >60 mL/min (ref >60)
GFR calc Af Amer: >60 mL/min (ref >60)
GFR calc non Af Amer: >60 mL/min (ref >60)
GFR calc non Af Amer: >60 mL/min (ref >60)
Glucose, Bld: 104 mg/dL — ABNORMAL HIGH (ref 70–99)
Glucose, Bld: 137 mg/dL — ABNORMAL HIGH (ref 70–99)
Potassium: 4.2 mmol/L (ref 3.5–5.1)
Potassium: 4.1 mmol/L (ref 3.5–5.1)
Sodium: 140 mmol/L (ref 135–145)
Sodium: 138 mmol/L (ref 135–145)

## 2019-11-04 LAB — CBC
HCT: 27.7 % — ABNORMAL LOW (ref 39.0–52.0)
HCT: 28.9 % — ABNORMAL LOW (ref 39.0–52.0)
Hemoglobin: 9.1 g/dL — ABNORMAL LOW (ref 13.0–17.0)
Hemoglobin: 9.6 g/dL — ABNORMAL LOW (ref 13.0–17.0)
MCH: 27.2 pg (ref 26.0–34.0)
MCH: 27.8 pg (ref 26.0–34.0)
MCHC: 32.9 g/dL (ref 30.0–36.0)
MCHC: 33.2 g/dL (ref 30.0–36.0)
MCV: 82.9 fL (ref 80.0–100.0)
MCV: 83.8 fL (ref 80.0–100.0)
Platelets: 122 10*3/uL — ABNORMAL LOW (ref 150–400)
Platelets: 147 10*3/uL — ABNORMAL LOW (ref 150–400)
RBC: 3.34 MIL/uL — ABNORMAL LOW (ref 4.22–5.81)
RBC: 3.45 MIL/uL — ABNORMAL LOW (ref 4.22–5.81)
RDW: 12.8 % (ref 11.5–15.5)
RDW: 13 % (ref 11.5–15.5)
WBC: 9.2 10*3/uL (ref 4.0–10.5)
WBC: 8.6 10*3/uL (ref 4.0–10.5)
nRBC: 0 % (ref 0.0–0.2)
nRBC: 0 % (ref 0.0–0.2)

## 2019-11-04 LAB — MAGNESIUM
Magnesium: 2.3 mg/dL (ref 1.7–2.4)
Magnesium: 2.4 mg/dL (ref 1.7–2.4)

## 2019-11-04 MED ORDER — LEVALBUTEROL HCL 0.63 MG/3ML IN NEBU: 0.6300 mg | INHALATION_SOLUTION | Freq: Four times a day (QID) | RESPIRATORY_TRACT | Status: DC | PRN

## 2019-11-04 MED ORDER — ROSUVASTATIN CALCIUM 20 MG PO TABS
40.0000 mg | ORAL_TABLET | Freq: Every day | ORAL | Status: DC
  Administered 2019-11-04 – 2019-11-06 (×3): 40 mg via ORAL
  Filled 2019-11-04 (×3): qty 2

## 2019-11-04 MED ORDER — ASPIRIN 81 MG PO CHEW
81.0000 mg | CHEWABLE_TABLET | Freq: Every day | ORAL | Status: DC
  Administered 2019-11-04 – 2019-11-07 (×4): 81 mg via ORAL
  Filled 2019-11-04 (×4): qty 1

## 2019-11-04 MED ORDER — EZETIMIBE 10 MG PO TABS
10.0000 mg | ORAL_TABLET | Freq: Every day | ORAL | Status: DC
  Administered 2019-11-04 – 2019-11-07 (×4): 10 mg via ORAL
  Filled 2019-11-04 (×4): qty 1

## 2019-11-04 MED ORDER — KETOROLAC TROMETHAMINE 15 MG/ML IJ SOLN
7.5000 mg | Freq: Four times a day (QID) | INTRAMUSCULAR | Status: AC
  Administered 2019-11-04 – 2019-11-05 (×5): 7.5 mg via INTRAVENOUS
  Filled 2019-11-04 (×5): qty 1

## 2019-11-04 MED ORDER — ORAL CARE MOUTH RINSE
15.0000 mL | Freq: Two times a day (BID) | OROMUCOSAL | Status: DC
  Administered 2019-11-04 – 2019-11-06 (×4): 15 mL via OROMUCOSAL

## 2019-11-04 MED ORDER — CLOPIDOGREL BISULFATE 75 MG PO TABS
75.0000 mg | ORAL_TABLET | Freq: Every day | ORAL | Status: DC
  Administered 2019-11-04 – 2019-11-07 (×4): 75 mg via ORAL
  Filled 2019-11-04 (×4): qty 1

## 2019-11-04 MED ORDER — LEVALBUTEROL HCL 0.63 MG/3ML IN NEBU
0.6300 mg | INHALATION_SOLUTION | Freq: Four times a day (QID) | RESPIRATORY_TRACT | Status: DC
  Administered 2019-11-04: 0.63 mg via RESPIRATORY_TRACT
  Filled 2019-11-04: qty 3

## 2019-11-04 MED ORDER — ISOSORBIDE DINITRATE 10 MG PO TABS
10.0000 mg | ORAL_TABLET | Freq: Three times a day (TID) | ORAL | Status: DC
  Administered 2019-11-04 – 2019-11-07 (×10): 10 mg via ORAL
  Filled 2019-11-04 (×10): qty 1

## 2019-11-04 MED ORDER — INSULIN ASPART 100 UNIT/ML ~~LOC~~ SOLN
0.0000 [IU] | SUBCUTANEOUS | Status: DC
  Administered 2019-11-04 – 2019-11-05 (×2): 2 [IU] via SUBCUTANEOUS
  Administered 2019-11-05: 09:00:00 4 [IU] via SUBCUTANEOUS

## 2019-11-04 MED ORDER — DIAZEPAM 2 MG PO TABS
2.0000 mg | ORAL_TABLET | Freq: Three times a day (TID) | ORAL | Status: AC
  Administered 2019-11-04 – 2019-11-05 (×6): 2 mg via ORAL
  Filled 2019-11-04 (×6): qty 1

## 2019-11-04 NOTE — Progress Notes (Signed)
Chaplain engaged in initial visit with John May.  Chaplain offered her support.  John May voiced that he is doing well but has just been feeling tired. Chaplain will continue to follow-up and check-in with [name].

## 2019-11-04 NOTE — Progress Notes (Signed)
EVENING ROUNDS NOTE :     [address]       [place] [address]             [phone]                 1 Day Post-Op Procedure(s) (LRB): CORONARY ARTERY BYPASS GRAFTING (CABG) using LIMA to LAD(m); Free RIMA to OM1 and OM2; Left radial artery to PDA; Right Greater Saphenous vein endoscopic harvested: SVG to Diag1; SVG to distal LAD. (N/A) RADIAL ARTERY HARVEST (Left) TRANSESOPHAGEAL ECHOCARDIOGRAM (TEE) (N/A) INDOCYANINE GREEN FLUORESCENCE IMAGING (ICG) (N/A) Endovein Harvest Of Greater Saphenous Vein of right LE. (Right)   Total Length of Stay:  LOS: 4 days  Events:  Doing well  no events today Resting in bed    BP 120/71   Pulse 73   Temp 98 F (36.7 C) (Oral)   Resp (!) 36   Ht 5\' 7"  (1.702 m)   Wt 98.6 kg   SpO2 99%   BMI 34.05 kg/m   PAP: (17-38)/(6-27) 22/10 CO:  [3.2 L/min-5.3 L/min] 4.8 L/min CI:  [1.6 L/min/m2-2.7 L/min/m2] 2.4 L/min/m2  Vent Mode: PSV;CPAP FiO2 (%):  [40 %-50 %] 40 % Set Rate:  [4 bmp-12 bmp] 4 bmp Vt Set:  [530 mL] 530 mL PEEP:  [5 cmH20] 5 cmH20 Pressure Support:  [10 cmH20] 10 cmH20  . sodium chloride Stopped (11/04/19 0940)  . sodium chloride 250 mL (11/04/19 0710)  . sodium chloride 20 mL/hr at 11/03/19 1515  . cefUROXime (ZINACEF)  IV Stopped (11/03/19 2105)  . lactated ringers    . lactated ringers    . lactated ringers Stopped (11/04/19 1045)  . nitroGLYCERIN    . nitroGLYCERIN Stopped (11/04/19 1045)  . norepinephrine (LEVOPHED) Adult infusion Stopped (11/03/19 1515)    I/O last 3 completed shifts: In: 6491.3 [P.O.:360; I.V.:4733.2; Blood:605; IV Piggyback:793.2] Out: 5610 [Urine:4130; Blood:950; Chest Tube:530]   CBC Latest Ref Rng & Units 11/04/2019 11/03/2019 11/03/2019  WBC 4.0 - 10.5 K/uL 9.2 - 14.2(H)  Hemoglobin 13.0 - 17.0 g/dL 11/05/2019) 8.5(Y) 8.5(O)  Hematocrit 39.0 - 52.0 % 27.7(L) 28.0(L) 28.9(L)  Platelets 150 - 400 K/uL 122(L) - 144(L)    BMP Latest Ref Rng & Units 11/04/2019 11/03/2019  11/03/2019  Glucose 70 - 99 mg/dL 11/05/2019) - 412(I)  BUN 6 - 20 mg/dL 9 - 10  Creatinine 786(V - 1.24 mg/dL 6.72 - 0.94  BUN/Creat Ratio 9 - 20 - - -  Sodium 135 - 145 mmol/L 140 141 138  Potassium 3.5 - 5.1 mmol/L 4.2 4.4 4.3  Chloride 98 - 111 mmol/L 108 - 109  CO2 22 - 32 mmol/L 25 - 24  Calcium 8.9 - 10.3 mg/dL 7.1(L) - 6.7(L)    ABG    Component Value Date/Time   PHART 7.399 11/03/2019 2144   PCO2ART 37.4 11/03/2019 2144   PO2ART 125.0 (H) 11/03/2019 2144   HCO3 23.2 11/03/2019 2144   TCO2 24 11/03/2019 2144   ACIDBASEDEF 1.0 11/03/2019 2144   O2SAT 99.0 11/03/2019 2144       11/05/2019, MD 11/04/2019 5:59 PM

## 2019-11-04 NOTE — Anesthesia Postprocedure Evaluation (Signed)
Anesthesia Post Note  Patient: ROCHELL MABIE  Procedure(s) Performed: CORONARY ARTERY BYPASS GRAFTING (CABG) using LIMA to LAD(m); Free RIMA to OM1 and OM2; Left radial artery to PDA; Right Greater Saphenous vein endoscopic harvested: SVG to Diag1; SVG to distal LAD. (N/A Chest) RADIAL ARTERY HARVEST (Left Arm Lower) TRANSESOPHAGEAL ECHOCARDIOGRAM (TEE) (N/A ) INDOCYANINE GREEN FLUORESCENCE IMAGING (ICG) (N/A ) Endovein Harvest Of Greater Saphenous Vein of right LE. (Right Leg Upper)     Patient location during evaluation: SICU Anesthesia Type: General Level of consciousness: sedated Pain management: pain level controlled Vital Signs Assessment: post-procedure vital signs reviewed and stable Respiratory status: patient remains intubated per anesthesia plan Cardiovascular status: stable Postop Assessment: no apparent nausea or vomiting Anesthetic complications: no    Last Vitals:  Vitals:   11/04/19 1800 11/04/19 1900  BP: 105/77 113/72  Pulse:    Resp: 14 17  Temp:    SpO2:      Last Pain:  Vitals:   11/04/19 1600  TempSrc:   PainSc: 0-No pain                 Trisha Ken

## 2019-11-04 NOTE — Progress Notes (Signed)
Progress Note  Patient Name: John May Date of Encounter: 11/04/2019  Primary Cardiologist: Olga Millers, MD   Subjective   No chest pain or shortness of breath. Surgical site pain well controlled. Looks forward to eating.    Inpatient Medications    Scheduled Meds: . acetaminophen  1,000 mg Oral Q6H   Or  . acetaminophen (TYLENOL) oral liquid 160 mg/5 mL  1,000 mg Per Tube Q6H  . aspirin  81 mg Oral Daily  . bisacodyl  10 mg Oral Daily   Or  . bisacodyl  10 mg Rectal Daily  . Chlorhexidine Gluconate Cloth  6 each Topical Daily  . clopidogrel  75 mg Oral Daily  . diazepam  2 mg Oral Q8H  . docusate sodium  200 mg Oral Daily  . isosorbide dinitrate  10 mg Oral TID  . ketorolac  7.5 mg Intravenous Q6H  . mouth rinse  15 mL Mouth Rinse BID  . metoprolol tartrate  12.5 mg Oral BID   Or  . metoprolol tartrate  12.5 mg Per Tube BID  . [START ON 11/05/2019] pantoprazole  40 mg Oral Daily  . pneumococcal 23 valent vaccine  0.5 mL Intramuscular Tomorrow-1000  . sodium chloride flush  10-40 mL Intracatheter Q12H  . sodium chloride flush  3 mL Intravenous Q12H   Continuous Infusions: . sodium chloride 20 mL/hr at 11/04/19 0800  . sodium chloride 250 mL (11/04/19 0710)  . sodium chloride 20 mL/hr at 11/03/19 1515  . albumin human 12.5 g (11/03/19 1658)  . cefUROXime (ZINACEF)  IV Stopped (11/03/19 2105)  . insulin 0.8 mL/hr at 11/04/19 0800  . lactated ringers    . lactated ringers    . lactated ringers 20 mL/hr at 11/04/19 0800  . nitroGLYCERIN    . nitroGLYCERIN 10 mcg/min (11/04/19 0800)  . norepinephrine (LEVOPHED) Adult infusion Stopped (11/03/19 1515)   PRN Meds: sodium chloride, albumin human, dextrose, lactated ringers, levalbuterol, metoprolol tartrate, midazolam, morphine injection, ondansetron (ZOFRAN) IV, oxyCODONE, sodium chloride flush, sodium chloride flush, traMADol   Vital Signs    Vitals:   11/04/19 0700 11/04/19 0800 11/04/19 0822 11/04/19 0900    BP: 109/72 114/64  124/75  Pulse: 94 87  88  Resp: (!) 0 12  14  Temp: 99.3 F (37.4 C) 99.3 F (37.4 C)  99.1 F (37.3 C)  TempSrc:  Core    SpO2: 100% 100% 100% 100%  Weight:      Height:        Intake/Output Summary (Last 24 hours) at 11/04/2019 0931 Last data filed at 11/04/2019 0800 Gross per 24 hour  Intake 5105.39 ml  Output 4510 ml  Net 595.39 ml   Last 3 Weights 11/04/2019 11/03/2019 11/01/2019  Weight (lbs) 217 lb 6 oz 199 lb 1.6 oz 198 lb 10.2 oz  Weight (kg) 98.6 kg 90.311 kg 90.1 kg      Telemetry    NSR with intermittent PVCs - Personally Reviewed  ECG    No significant change from previous - Personally Reviewed  Physical Exam   GEN: No acute distress.   Neck: No JVD Cardiac: RRR, no murmurs, rubs, or gallops.  Respiratory: Clear to auscultation bilaterally. GI: Soft, nontender, non-distended  MS: No edema; No deformity. Neuro:  Nonfocal  Psych: Normal affect   Labs    High Sensitivity Troponin:   Recent Labs  Lab 10/31/19 2007 10/31/19 2127  TROPONINIHS 273* 422*      Chemistry Recent Labs  Lab 10/31/19 2015 11/01/19 0502 11/03/19 0525 11/03/19 0803 11/03/19 1411 11/03/19 1541 11/03/19 2119 11/03/19 2144 11/04/19 0423  NA  --    < > 135   < > 141   < > 138 141 140  K  --    < > 3.8   < > 3.6   < > 4.3 4.4 4.2  CL  --    < > 100   < > 102  --  109  --  108  CO2  --    < > 22  --   --   --  24  --  25  GLUCOSE  --    < > 224*   < > 145*  --  138*  --  104*  BUN  --    < > 16   < > 12  --  10  --  9  CREATININE  --    < > 1.20   < > 0.70  --  0.82  --  0.76  CALCIUM  --    < > 9.0  --   --   --  6.7*  --  7.1*  PROT 7.1  --  6.9  --   --   --   --   --   --   ALBUMIN 4.2  --  3.6  --   --   --   --   --   --   AST 28  --  29  --   --   --   --   --   --   ALT 30  --  41  --   --   --   --   --   --   ALKPHOS 73  --  71  --   --   --   --   --   --   BILITOT 0.7  --  0.8  --   --   --   --   --   --   GFRNONAA  --    < > >60  --    --   --  >60  --  >60  GFRAA  --    < > >60  --   --   --  >60  --  >60  ANIONGAP  --    < > 13  --   --   --  5  --  7   < > = values in this interval not displayed.     Hematology Recent Labs  Lab 11/03/19 1513 11/03/19 1541 11/03/19 2119 11/03/19 2144 11/04/19 0423  WBC 13.8*  --  14.2*  --  9.2  RBC 3.92*  --  3.46*  --  3.34*  HGB 10.7*   < > 9.5* 9.5* 9.1*  HCT 32.4*   < > 28.9* 28.0* 27.7*  MCV 82.7  --  83.5  --  82.9  MCH 27.3  --  27.5  --  27.2  MCHC 33.0  --  32.9  --  32.9  RDW 12.5  --  12.6  --  12.8  PLT 121*  --  144*  --  122*   < > = values in this interval not displayed.    BNP Recent Labs  Lab 10/31/19 2029  BNP 87.7     DDimer No results for input(s): DDIMER in the last 168 hours.   Radiology    CARDIAC CATHETERIZATION  Result Date: 11/02/2019  1st Mrg lesion is 90% stenosed.  2nd Mrg lesion is 90% stenosed.  Mid Cx lesion is 90% stenosed.  1st Diag lesion is 95% stenosed.  Mid LAD lesion is 99% stenosed.  Dist LAD lesion is 99% stenosed.  Dist RCA lesion is 100% stenosed.  Prox RCA to Mid RCA lesion is 99% stenosed.  Prox RCA lesion is 50% stenosed.  Severe triple vessel CAD Recommendations: Severe multi-vessel CAD. Will consult CT surgery for consideration of CABG which appears to be the best method revascularization given the complexity of his disease.   DG Chest Port 1 View  Result Date: 11/03/2019 CLINICAL DATA:  Coronary artery disease. Status post CABG. EXAM: PORTABLE CHEST 1 VIEW COMPARISON:  10/31/2019 FINDINGS: Endotracheal tube is 3.8 cm above the carina in good position. Swan-Ganz catheter tip is in the main pulmonary artery. Three chest tubes in place. No pneumothorax. Minimal atelectasis at the lung bases. IMPRESSION: Minimal bibasilar atelectasis. Electronically Signed   By: Francene BoyersJames  Maxwell M.D.   On: 11/03/2019 16:20   VAS US DOPPLER PRE CABG  Result Date: 11/03/2019 PREOPERATIVE VASCULAR EVALUATION  Indications:       Pre-CABG. Risk Factors:     Hypertension, hyperlipidemia, coronary artery disease. Comparison Study: No prior study Performing Technologist: Gertie FeyMichelle Simonetti MHA, RDMS, RVT, RDCS  Examination Guidelines: A complete evaluation includes B-mode imaging, spectral Doppler, color Doppler, and power Doppler as needed of all accessible portions of each vessel. Bilateral testing is considered an integral part of a complete examination. Limited examinations for reoccurring indications may be performed as noted.  Right Carotid Findings: +----------+--------+-------+--------+----------------------+------------------+           PSV cm/sEDV    StenosisDescribe              Comments                             cm/s                                                    +----------+--------+-------+--------+----------------------+------------------+ CCA Prox  89      24                                   intimal thickening +----------+--------+-------+--------+----------------------+------------------+ CCA Distal85      23                                   intimal thickening +----------+--------+-------+--------+----------------------+------------------+ ICA Prox  75      24             smooth and                                                                heterogenous                             +----------+--------+-------+--------+----------------------+------------------+ ICA Distal83  37                                                      +----------+--------+-------+--------+----------------------+------------------+ ECA       116     12                                                      +----------+--------+-------+--------+----------------------+------------------+ Portions of this table do not appear on this page. +----------+--------+-------+----------------+------------+           PSV cm/sEDV cmsDescribe        Arm Pressure  +----------+--------+-------+----------------+------------+ Subclavian120            Multiphasic, [id number]          +----------+--------+-------+----------------+------------+ +---------+--------+--+--------+-+---------+ VertebralPSV cm/s26EDV cm/s7Antegrade +---------+--------+--+--------+-+---------+ Left Carotid Findings: +----------+--------+--------+--------+-----------------------+--------+           PSV cm/sEDV cm/sStenosisDescribe               Comments +----------+--------+--------+--------+-----------------------+--------+ CCA Prox  110     24                                              +----------+--------+--------+--------+-----------------------+--------+ CCA Distal102     28              smooth and heterogenous         +----------+--------+--------+--------+-----------------------+--------+ ICA Prox  51      21              smooth and heterogenous         +----------+--------+--------+--------+-----------------------+--------+ ICA Distal77      36                                              +----------+--------+--------+--------+-----------------------+--------+ ECA       114     14                                              +----------+--------+--------+--------+-----------------------+--------+ +----------+--------+--------+----------------+------------+ SubclavianPSV cm/sEDV cm/sDescribe        Arm Pressure +----------+--------+--------+----------------+------------+           237             Multiphasic, WNL             +----------+--------+--------+----------------+------------+ +---------+--------+--+--------+--+---------+ VertebralPSV cm/s44EDV cm/s16Antegrade +---------+--------+--+--------+--+---------+  ABI Findings: +--------+------------------+-----+---------+--------+ Right   Rt Pressure (mmHg)IndexWaveform Comment  +--------+------------------+-----+---------+--------+ [id number]                     triphasic         +--------+------------------+-----+---------+--------+ PTA                            triphasic         +--------+------------------+-----+---------+--------+ DP  triphasic         +--------+------------------+-----+---------+--------+ +--------+------------------+-----+---------+----------------------------------+ Left    Lt Pressure (mmHg)IndexWaveform Comment                            +--------+------------------+-----+---------+----------------------------------+ Brachial                       triphasicUnable to obtain pressure due to                                           IV location                        +--------+------------------+-----+---------+----------------------------------+ PTA                            triphasic                                   +--------+------------------+-----+---------+----------------------------------+ DP                             triphasic                                   +--------+------------------+-----+---------+----------------------------------+  Right Doppler Findings: +--------+--------+-----+---------+--------+ Site    PressureIndexDoppler  Comments +--------+--------+-----+---------+--------+ [id number]          triphasic         +--------+--------+-----+---------+--------+ Radial               triphasic         +--------+--------+-----+---------+--------+ Ulnar                triphasic         +--------+--------+-----+---------+--------+  Left Doppler Findings: +--------+--------+-----+---------+--------------------------------------------+ Site    PressureIndexDoppler  Comments                                     +--------+--------+-----+---------+--------------------------------------------+ Brachial             triphasicUnable to obtain pressure due to IV location  +--------+--------+-----+---------+--------------------------------------------+ Radial               triphasic                                             +--------+--------+-----+---------+--------------------------------------------+ Ulnar                triphasic                                             +--------+--------+-----+---------+--------------------------------------------+  Summary: Right Carotid: Velocities in the right ICA are consistent with a 1-39% stenosis. Left Carotid: Velocities in the left ICA are consistent with a 1-39% stenosis. Vertebrals:  Bilateral vertebral arteries demonstrate antegrade flow. Subclavians: Normal flow hemodynamics were seen in bilateral subclavian  arteries. Pedal waveforms: Bilateral pedal waveforms are within normal limits with triphasic waveforms. Right Upper Extremity: Doppler waveforms remain within normal limits with right radial compression. Doppler waveform obliterate with right ulnar compression.  Left Upper Extremity: Doppler waveforms decrease >50% with left radial compression. Doppler waveforms remain within normal limits with left ulnar compression.  Electronically signed by Fabienne Bruns MD on 11/03/2019 at 11:51:57 AM.    Final     Cardiac Studies   TTE 2/21 1. Left ventricular ejection fraction, by estimation, is 55%. The left  ventricle has normal function. The left ventricle demonstrates regional  wall motion abnormalities (see scoring diagram/findings for description).  There is mild left ventricular  hypertrophy. Left ventricular diastolic parameters are indeterminate.  2. Right ventricular systolic function is normal. The right ventricular  size is normal. Tricuspid regurgitation signal is inadequate for assessing  PA pressure.  3. The mitral valve is grossly normal. Trivial mitral valve  regurgitation.  4. The aortic valve is tricuspid. Aortic valve regurgitation is not  visualized.   Patient Profile       54 yo man with 20 yr h/o CAD s/p PCI in 2013 presented with [place] over the weekend. He is known to have diffuse residual disease which has been managed medically.  Eval showed NSTEMI on this admission. He underwent LHC 2/22 demonstrating multivessel CAD. Subsequently underwent CABG x6 on 2/23.   Assessment & Plan    Principal Problem: Unstable angina (HCC) Active Problems: NSTEMI (non-ST elevated myocardial infarction) (HCC) S/P CABG x 6 Coronary artery disease  Plan: Recovering well POD#1 from CABG. Pain currently well controlled. Post-op blood counts stable. Blood pressure good. 530 o/p from chest tube. Transitioned off nitro drip to isosorbide. Continue medical management with DAPT, beta blocker. Would resume statin and Zetia.       For questions or updates, please contact [facility] Please consult [email or web address] for contact info under        Signed, Bridget Hartshorn, DO  11/04/2019, 9:31 AM

## 2019-11-04 NOTE — Progress Notes (Signed)
1 Day Post-Op Procedure(s) (LRB): CORONARY ARTERY BYPASS GRAFTING (CABG) using LIMA to LAD(m); Free RIMA to OM1 and OM2; Left radial artery to PDA; Right Greater Saphenous vein endoscopic harvested: SVG to Diag1; SVG to distal LAD. (N/A) RADIAL ARTERY HARVEST (Left) TRANSESOPHAGEAL ECHOCARDIOGRAM (TEE) (N/A) INDOCYANINE GREEN FLUORESCENCE IMAGING (ICG) (N/A) Endovein Harvest Of Greater Saphenous Vein of right LE. (Right) Subjective: No complaints except pain in incision  Objective: Vital signs in last 24 hours: Temp:  [95.4 F (35.2 C)-99.3 F (37.4 C)] 99.3 F (37.4 C) (02/24 0700) Pulse Rate:  [77-95] 94 (02/24 0700) Cardiac Rhythm: Atrial paced (02/24 0700) Resp:  [0-29] 0 (02/24 0700) BP: (91-135)/(59-98) 109/72 (02/24 0700) SpO2:  [96 %-100 %] 100 % (02/24 0700) Arterial Line BP: (92-155)/(51-99) 140/61 (02/24 0700) FiO2 (%):  [40 %-50 %] 40 % (02/23 2044) Weight:  [98.6 kg] 98.6 kg (02/24 0500)  Hemodynamic parameters for last 24 hours: PAP: (17-38)/(6-27) 17/6 CO:  [2.7 L/min-5.3 L/min] 4.8 L/min CI:  [1.3 L/min/m2-2.7 L/min/m2] 2.4 L/min/m2  Intake/Output from previous day: 02/23 0701 - 02/24 0700 In: 5161.5 [P.O.:120; I.V.:3643.3; Blood:605; IV Piggyback:793.2] Out: 4860 [Urine:3380; Blood:950; Chest Tube:530] Intake/Output this shift: No intake/output data recorded.  General appearance: alert and cooperative Neurologic: intact Heart: regular rate and rhythm, S1, S2 normal, no murmur, click, rub or gallop Lungs: clear to auscultation bilaterally Abdomen: soft, non-tender; bowel sounds normal; no masses,  no organomegaly Extremities: extremities normal, atraumatic, no cyanosis or edema Wound: dressed, dry  Lab Results: Recent Labs    11/03/19 2119 11/03/19 2119 11/03/19 2144 11/04/19 0423  WBC 14.2*  --   --  9.2  HGB 9.5*   < > 9.5* 9.1*  HCT 28.9*   < > 28.0* 27.7*  PLT 144*  --   --  122*   < > = values in this interval not displayed.   BMET:   Recent Labs    11/03/19 2119 11/03/19 2119 11/03/19 2144 11/04/19 0423  NA 138   < > 141 140  K 4.3   < > 4.4 4.2  CL 109  --   --  108  CO2 24  --   --  25  GLUCOSE 138*  --   --  104*  BUN 10  --   --  9  CREATININE 0.82  --   --  0.76  CALCIUM 6.7*  --   --  7.1*   < > = values in this interval not displayed.    PT/INR:  Recent Labs    11/03/19 1513  LABPROT 18.9*  INR 1.6*   ABG    Component Value Date/Time   PHART 7.399 11/03/2019 2144   HCO3 23.2 11/03/2019 2144   TCO2 24 11/03/2019 2144   ACIDBASEDEF 1.0 11/03/2019 2144   O2SAT 99.0 11/03/2019 2144   CBG (last 3)  Recent Labs    11/04/19 0514 11/04/19 0614 11/04/19 0714  GLUCAP 109* 111* 102*    Assessment/Plan: S/P Procedure(s) (LRB): CORONARY ARTERY BYPASS GRAFTING (CABG) using LIMA to LAD(m); Free RIMA to OM1 and OM2; Left radial artery to PDA; Right Greater Saphenous vein endoscopic harvested: SVG to Diag1; SVG to distal LAD. (N/A) RADIAL ARTERY HARVEST (Left) TRANSESOPHAGEAL ECHOCARDIOGRAM (TEE) (N/A) INDOCYANINE GREEN FLUORESCENCE IMAGING (ICG) (N/A) Endovein Harvest Of Greater Saphenous Vein of right LE. (Right) Mobilize discontinue PA catheter  Out of bed Off NTG>>isosorbide Beta-blocker plavix/aspirin   LOS: 4 days    Linden Dolin 11/04/2019

## 2019-11-05 ENCOUNTER — Inpatient Hospital Stay (HOSPITAL_COMMUNITY): Payer: Medicare Other

## 2019-11-05 LAB — GLUCOSE, CAPILLARY
Glucose-Capillary: 110 mg/dL — ABNORMAL HIGH (ref 70–99)
Glucose-Capillary: 180 mg/dL — ABNORMAL HIGH (ref 70–99)
Glucose-Capillary: 123 mg/dL — ABNORMAL HIGH (ref 70–99)
Glucose-Capillary: 114 mg/dL — ABNORMAL HIGH (ref 70–99)
Glucose-Capillary: 84 mg/dL (ref 70–99)

## 2019-11-05 LAB — CBC
HCT: 27.3 % — ABNORMAL LOW (ref 39.0–52.0)
Hemoglobin: 8.9 g/dL — ABNORMAL LOW (ref 13.0–17.0)
MCH: 27.6 pg (ref 26.0–34.0)
MCHC: 32.6 g/dL (ref 30.0–36.0)
MCV: 84.8 fL (ref 80.0–100.0)
Platelets: 129 10*3/uL — ABNORMAL LOW (ref 150–400)
RBC: 3.22 MIL/uL — ABNORMAL LOW (ref 4.22–5.81)
RDW: 13.1 % (ref 11.5–15.5)
WBC: 8.3 10*3/uL (ref 4.0–10.5)
nRBC: 0 % (ref 0.0–0.2)

## 2019-11-05 LAB — BASIC METABOLIC PANEL
Anion gap: 8 (ref 5–15)
BUN: 10 mg/dL (ref 6–20)
CO2: 26 mmol/L (ref 22–32)
Calcium: 7.9 mg/dL — ABNORMAL LOW (ref 8.9–10.3)
Chloride: 105 mmol/L (ref 98–111)
Creatinine, Ser: 0.91 mg/dL (ref 0.61–1.24)
GFR calc Af Amer: >60 mL/min (ref >60)
GFR calc non Af Amer: >60 mL/min (ref >60)
Glucose, Bld: 120 mg/dL — ABNORMAL HIGH (ref 70–99)
Potassium: 4.1 mmol/L (ref 3.5–5.1)
Sodium: 139 mmol/L (ref 135–145)

## 2019-11-05 MED ORDER — FUROSEMIDE 10 MG/ML IJ SOLN
40.0000 mg | Freq: Every day | INTRAMUSCULAR | Status: DC
  Administered 2019-11-06 – 2019-11-07 (×2): 40 mg via INTRAVENOUS
  Filled 2019-11-05 (×2): qty 4

## 2019-11-05 MED ORDER — METOPROLOL TARTRATE 25 MG PO TABS
25.0000 mg | ORAL_TABLET | Freq: Two times a day (BID) | ORAL | Status: DC
  Administered 2019-11-05 (×2): 25 mg via ORAL
  Filled 2019-11-05 (×2): qty 1

## 2019-11-05 MED ORDER — FUROSEMIDE 10 MG/ML IJ SOLN
40.0000 mg | Freq: Once | INTRAMUSCULAR | Status: AC
  Administered 2019-11-05: 40 mg via INTRAVENOUS
  Filled 2019-11-05: qty 4

## 2019-11-05 MED ORDER — METOPROLOL TARTRATE 25 MG/10 ML ORAL SUSPENSION
25.0000 mg | Freq: Two times a day (BID) | ORAL | Status: DC
  Filled 2019-11-05: qty 10

## 2019-11-05 NOTE — Progress Notes (Signed)
CARDIAC REHAB PHASE I   Went to walk with pt. RN informed Clinical research associate he was on bedrest for a half hour and was moving rooms shortly. Will f/u later today as time allows.  Reynold Bowen, RN BSN 11/05/2019 11:08 AM

## 2019-11-05 NOTE — Plan of Care (Signed)

## 2019-11-05 NOTE — Progress Notes (Signed)
Progress Note  Patient Name: John May Date of Encounter: 11/05/2019  Primary Cardiologist: Olga Millers, MD   Subjective   Feeling much better today. Already walked this morning. No dyspnea or chest pain other than discomfort at surgical site and chest tubes.   Inpatient Medications    Scheduled Meds: . acetaminophen  1,000 mg Oral Q6H   Or  . acetaminophen (TYLENOL) oral liquid 160 mg/5 mL  1,000 mg Per Tube Q6H  . aspirin  81 mg Oral Daily  . bisacodyl  10 mg Oral Daily   Or  . bisacodyl  10 mg Rectal Daily  . Chlorhexidine Gluconate Cloth  6 each Topical Daily  . clopidogrel  75 mg Oral Daily  . diazepam  2 mg Oral Q8H  . docusate sodium  200 mg Oral Daily  . ezetimibe  10 mg Oral Daily  . furosemide  40 mg Intravenous Once  . insulin aspart  0-24 Units Subcutaneous Q4H  . isosorbide dinitrate  10 mg Oral TID  . mouth rinse  15 mL Mouth Rinse BID  . metoprolol tartrate  25 mg Oral BID   Or  . metoprolol tartrate  25 mg Per Tube BID  . pantoprazole  40 mg Oral Daily  . pneumococcal 23 valent vaccine  0.5 mL Intramuscular Tomorrow-1000  . rosuvastatin  40 mg Oral q1800  . sodium chloride flush  10-40 mL Intracatheter Q12H  . sodium chloride flush  3 mL Intravenous Q12H   Continuous Infusions: . sodium chloride Stopped (11/04/19 0940)  . sodium chloride 250 mL (11/04/19 0710)  . sodium chloride 20 mL/hr at 11/03/19 1515  . cefUROXime (ZINACEF)  IV 1.5 g (11/05/19 0641)  . lactated ringers    . lactated ringers    . lactated ringers Stopped (11/04/19 1045)  . nitroGLYCERIN    . nitroGLYCERIN Stopped (11/04/19 1045)  . norepinephrine (LEVOPHED) Adult infusion Stopped (11/03/19 1515)   PRN Meds: sodium chloride, dextrose, lactated ringers, levalbuterol, metoprolol tartrate, morphine injection, ondansetron (ZOFRAN) IV, oxyCODONE, sodium chloride flush, sodium chloride flush, traMADol   Vital Signs    Vitals:   11/05/19 0300 11/05/19 0400 11/05/19 0500  11/05/19 0600  BP: 105/80 124/78 123/82   Pulse: 85 87 87   Resp: 16 (!) 24 (!) 30   Temp: 98.3 F (36.8 C)     TempSrc: Oral     SpO2: 100% 99% 98%   Weight:    93.8 kg  Height:        Intake/Output Summary (Last 24 hours) at 11/05/2019 0803 Last data filed at 11/05/2019 0630 Gross per 24 hour  Intake 682.64 ml  Output 3140 ml  Net -2457.36 ml   Last 3 Weights 11/05/2019 11/04/2019 11/03/2019  Weight (lbs) 206 lb 12.7 oz 217 lb 6 oz 199 lb 1.6 oz  Weight (kg) 93.8 kg 98.6 kg 90.311 kg      Telemetry    NSR with intermittent PVCs - Personally Reviewed  ECG    No new tracings - Personally Reviewed  Physical Exam   GEN: No acute distress.   Neck: No JVD Cardiac: RRR, no murmurs, rubs, or gallops.  Respiratory: Clear to auscultation bilaterally. GI: Soft, nontender, non-distended  MS: 1+ BLE edema; No deformity. Neuro:  Nonfocal  Psych: Normal affect   Labs    High Sensitivity Troponin:   Recent Labs  Lab 10/31/19 2007 10/31/19 2127  TROPONINIHS 273* 422*      Chemistry Recent Labs  Lab 10/31/19  2015 11/01/19 0502 11/03/19 0525 11/03/19 0803 11/04/19 0423 11/04/19 1740 11/05/19 0457  NA  --    < > 135   < > 140 138 139  K  --    < > 3.8   < > 4.2 4.1 4.1  CL  --    < > 100   < > 108 104 105  CO2  --    < > 22   < > 25 26 26   GLUCOSE  --    < > 224*   < > 104* 137* 120*  BUN  --    < > 16   < > 9 7 10   CREATININE  --    < > 1.20   < > 0.76 0.88 0.91  CALCIUM  --    < > 9.0   < > 7.1* 7.9* 7.9*  PROT 7.1  --  6.9  --   --   --   --   ALBUMIN 4.2  --  3.6  --   --   --   --   AST 28  --  29  --   --   --   --   ALT 30  --  41  --   --   --   --   ALKPHOS 73  --  71  --   --   --   --   BILITOT 0.7  --  0.8  --   --   --   --   GFRNONAA  --    < > >60   < > >60 >60 >60  GFRAA  --    < > >60   < > >60 >60 >60  ANIONGAP  --    < > 13   < > 7 8 8    < > = values in this interval not displayed.     Hematology Recent Labs  Lab 11/04/19 0423  11/04/19 1740 11/05/19 0457  WBC 9.2 8.6 8.3  RBC 3.34* 3.45* 3.22*  HGB 9.1* 9.6* 8.9*  HCT 27.7* 28.9* 27.3*  MCV 82.9 83.8 84.8  MCH 27.2 27.8 27.6  MCHC 32.9 33.2 32.6  RDW 12.8 13.0 13.1  PLT 122* 147* 129*    BNP Recent Labs  Lab 10/31/19 2029  BNP 87.7     DDimer No results for input(s): DDIMER in the last 168 hours.   Radiology    DG Chest Port 1 View  Result Date: 11/04/2019 CLINICAL DATA:  Chest tube, status post CABG EXAM: PORTABLE CHEST 1 VIEW COMPARISON:  11/03/2019 FINDINGS: Interval endotracheal extubation. Bilateral chest tubes remain in position. No significant pneumothorax appreciated. Right neck pulmonary vascular catheter remains in position, tip directed over the right pulmonary artery. Mild cardiomegaly status post median sternotomy. IMPRESSION: 1.  Interval extubation. 2. Bilateral chest tubes remain in position. No significant pneumothorax. 3.  No acute appearing airspace opacity. Electronically Signed   By: 2030 M.D.   On: 11/04/2019 09:32   DG Chest Port 1 View  Result Date: 11/03/2019 CLINICAL DATA:  Coronary artery disease. Status post CABG. EXAM: PORTABLE CHEST 1 VIEW COMPARISON:  10/31/2019 FINDINGS: Endotracheal tube is 3.8 cm above the carina in good position. Swan-Ganz catheter tip is in the main pulmonary artery. Three chest tubes in place. No pneumothorax. Minimal atelectasis at the lung bases. IMPRESSION: Minimal bibasilar atelectasis. Electronically Signed   By: 11/06/2019 M.D.   On: 11/03/2019 16:20    Cardiac  Studies   TTE 2/21 1. Left ventricular ejection fraction, by estimation, is 55%. The left  ventricle has normal function. The left ventricle demonstrates regional  wall motion abnormalities (see scoring diagram/findings for description).  There is mild left ventricular  hypertrophy. Left ventricular diastolic parameters are indeterminate.  2. Right ventricular systolic function is normal. The right ventricular  size  is normal. Tricuspid regurgitation signal is inadequate for assessing  PA pressure.  3. The mitral valve is grossly normal. Trivial mitral valve  regurgitation.  4. The aortic valve is tricuspid. Aortic valve regurgitation is not  visualized.   Patient Profile     54 yo man with 89 yr h/o CAD s/p PCI in 2013 presented with [place] over the weekend. He is known to have diffuse residual disease which has been managed medically. Eval showed NSTEMI on this admission. He underwent LHC 2/22 demonstrating multivessel CAD. Subsequently underwent CABG x6 on 2/23.   Assessment & Plan    Principal Problem: Unstable angina (HCC) Active Problems: NSTEMI (non-ST elevated myocardial infarction) (HCC) S/P CABG x 6 Coronary artery disease  Plan: Recovering well POD#2 from CABG. No oxygen requirement, but does have some peripheral edema. Agree with diuresing today. H&H trend from yesterday 9.6/28.9>8.9/27.3. Continue to monitor. Blood pressure stable. 675 o/p from chest tube. Continue medical management with DAPT, beta blocker, statin and zetia.      For questions or updates, please contact [place] Please consult [email or web address] for contact info under        Signed, Delice Bison, DO  11/05/2019, 8:03 AM

## 2019-11-05 NOTE — Progress Notes (Signed)
CARDIAC REHAB PHASE I   Offered to walk with pt. Pt c/o nausea and fatigue. Pt states he has not slept much. IS placed on bedside table, encouraged use, and ambulation later if nausea subsides. Will continue to follow.  [id number] Reynold Bowen, RN BSN 11/05/2019 2:20 PM

## 2019-11-05 NOTE — Progress Notes (Signed)
2 Days Post-Op Procedure(s) (LRB): CORONARY ARTERY BYPASS GRAFTING (CABG) using LIMA to LAD(m); Free RIMA to OM1 and OM2; Left radial artery to PDA; Right Greater Saphenous vein endoscopic harvested: SVG to Diag1; SVG to distal LAD. (N/A) RADIAL ARTERY HARVEST (Left) TRANSESOPHAGEAL ECHOCARDIOGRAM (TEE) (N/A) INDOCYANINE GREEN FLUORESCENCE IMAGING (ICG) (N/A) Endovein Harvest Of Greater Saphenous Vein of right LE. (Right) Subjective: No complaints  Objective: Vital signs in last 24 hours: Temp:  [98.3 F (36.8 C)-98.9 F (37.2 C)] 98.4 F (36.9 C) (02/25 1141) Pulse Rate:  [81-98] 93 (02/25 1100) Cardiac Rhythm: Normal sinus rhythm (02/25 0800) Resp:  [7-30] 22 (02/25 1100) BP: (93-125)/(67-82) 105/71 (02/25 1100) SpO2:  [91 %-100 %] 95 % (02/25 1100) Arterial Line BP: (122-137)/(58-65) 133/65 (02/24 1700) Weight:  [93.8 kg] 93.8 kg (02/25 0600)  Hemodynamic parameters for last 24 hours:    Intake/Output from previous day: 02/24 0701 - 02/25 0700 In: 789.5 [P.O.:480; I.V.:146.6; IV Piggyback:162.9] Out: 3190 [Urine:2505; Chest Tube:685] Intake/Output this shift: Total I/O In: 37.1 [IV Piggyback:37.1] Out: 1505 [Urine:1475; Chest Tube:30]  General appearance: alert and cooperative Neurologic: intact Heart: regular rate and rhythm, S1, S2 normal, no murmur, click, rub or gallop Lungs: clear to auscultation bilaterally Abdomen: soft, non-tender; bowel sounds normal; no masses,  no organomegaly Extremities: extremities normal, atraumatic, no cyanosis or edema Wound: c/d/i  Lab Results: Recent Labs    11/04/19 1740 11/05/19 0457  WBC 8.6 8.3  HGB 9.6* 8.9*  HCT 28.9* 27.3*  PLT 147* 129*   BMET:  Recent Labs    11/04/19 1740 11/05/19 0457  NA 138 139  K 4.1 4.1  CL 104 105  CO2 26 26  GLUCOSE 137* 120*  BUN 7 10  CREATININE 0.88 0.91  CALCIUM 7.9* 7.9*    PT/INR:  Recent Labs    11/03/19 1513  LABPROT 18.9*  INR 1.6*   ABG    Component Value  Date/Time   PHART 7.399 11/03/2019 2144   HCO3 23.2 11/03/2019 2144   TCO2 24 11/03/2019 2144   ACIDBASEDEF 1.0 11/03/2019 2144   O2SAT 99.0 11/03/2019 2144   CBG (last 3)  Recent Labs    11/05/19 0403 11/05/19 0831 11/05/19 1142  GLUCAP 110* 180* 123*    Assessment/Plan: S/P Procedure(s) (LRB): CORONARY ARTERY BYPASS GRAFTING (CABG) using LIMA to LAD(m); Free RIMA to OM1 and OM2; Left radial artery to PDA; Right Greater Saphenous vein endoscopic harvested: SVG to Diag1; SVG to distal LAD. (N/A) RADIAL ARTERY HARVEST (Left) TRANSESOPHAGEAL ECHOCARDIOGRAM (TEE) (N/A) INDOCYANINE GREEN FLUORESCENCE IMAGING (ICG) (N/A) Endovein Harvest Of Greater Saphenous Vein of right LE. (Right) Mobilize Diuresis See progression orders   LOS: 5 days    Linden Dolin 11/05/2019

## 2019-11-05 NOTE — Plan of Care (Signed)
  Problem: Education: Goal: Knowledge of General Education information will improve Description: Including pain rating scale, medication(s)/side effects and non-pharmacologic comfort measures Outcome: Progressing   Problem: Clinical Measurements: Goal: Ability to maintain clinical measurements within normal limits will improve Outcome: Progressing Goal: Will remain free from infection Outcome: Progressing Goal: Diagnostic test results will improve Outcome: Progressing Goal: Respiratory complications will improve Outcome: Progressing Goal: Cardiovascular complication will be avoided Outcome: Progressing   Problem: Activity: Goal: Risk for activity intolerance will decrease Outcome: Progressing   Problem: Coping: Goal: Level of anxiety will decrease Outcome: Progressing   Problem: Elimination: Goal: Will not experience complications related to bowel motility Outcome: Progressing Goal: Will not experience complications related to urinary retention Outcome: Progressing

## 2019-11-06 ENCOUNTER — Inpatient Hospital Stay (HOSPITAL_COMMUNITY): Payer: Medicare Other

## 2019-11-06 LAB — CBC
HCT: 29.8 % — ABNORMAL LOW (ref 39.0–52.0)
Hemoglobin: 9.9 g/dL — ABNORMAL LOW (ref 13.0–17.0)
MCH: 27.7 pg (ref 26.0–34.0)
MCHC: 33.2 g/dL (ref 30.0–36.0)
MCV: 83.5 fL (ref 80.0–100.0)
Platelets: 172 10*3/uL (ref 150–400)
RBC: 3.57 MIL/uL — ABNORMAL LOW (ref 4.22–5.81)
RDW: 12.5 % (ref 11.5–15.5)
WBC: 11.5 10*3/uL — ABNORMAL HIGH (ref 4.0–10.5)
nRBC: 0 % (ref 0.0–0.2)

## 2019-11-06 LAB — BASIC METABOLIC PANEL
Anion gap: 12 (ref 5–15)
BUN: 12 mg/dL (ref 6–20)
CO2: 26 mmol/L (ref 22–32)
Calcium: 8 mg/dL — ABNORMAL LOW (ref 8.9–10.3)
Chloride: 98 mmol/L (ref 98–111)
Creatinine, Ser: 1.01 mg/dL (ref 0.61–1.24)
GFR calc Af Amer: >60 mL/min (ref >60)
GFR calc non Af Amer: >60 mL/min (ref >60)
Glucose, Bld: 110 mg/dL — ABNORMAL HIGH (ref 70–99)
Potassium: 3.7 mmol/L (ref 3.5–5.1)
Sodium: 136 mmol/L (ref 135–145)

## 2019-11-06 LAB — GLUCOSE, CAPILLARY
Glucose-Capillary: 91 mg/dL (ref 70–99)
Glucose-Capillary: 106 mg/dL — ABNORMAL HIGH (ref 70–99)
Glucose-Capillary: 113 mg/dL — ABNORMAL HIGH (ref 70–99)
Glucose-Capillary: 97 mg/dL (ref 70–99)
Glucose-Capillary: 137 mg/dL — ABNORMAL HIGH (ref 70–99)

## 2019-11-06 MED ORDER — BISACODYL 10 MG RE SUPP
10.0000 mg | Freq: Once | RECTAL | Status: AC
  Administered 2019-11-06: 10 mg via RECTAL
  Filled 2019-11-06: qty 1

## 2019-11-06 MED ORDER — CAPTOPRIL 6.25 MG HALF TABLET
6.2500 mg | ORAL_TABLET | Freq: Three times a day (TID) | ORAL | Status: DC
  Administered 2019-11-06 – 2019-11-07 (×4): 6.25 mg via ORAL
  Filled 2019-11-06 (×4): qty 1

## 2019-11-06 MED ORDER — METOPROLOL TARTRATE 25 MG/10 ML ORAL SUSPENSION: 37.5000 mg | Freq: Two times a day (BID) | ORAL | Status: DC

## 2019-11-06 MED ORDER — POTASSIUM CHLORIDE CRYS ER 20 MEQ PO TBCR
20.0000 meq | EXTENDED_RELEASE_TABLET | Freq: Two times a day (BID) | ORAL | Status: DC
  Administered 2019-11-06 – 2019-11-07 (×3): 20 meq via ORAL
  Filled 2019-11-06 (×2): qty 1

## 2019-11-06 MED ORDER — INSULIN ASPART 100 UNIT/ML ~~LOC~~ SOLN
0.0000 [IU] | Freq: Three times a day (TID) | SUBCUTANEOUS | Status: DC
  Administered 2019-11-06: 2 [IU] via SUBCUTANEOUS

## 2019-11-06 MED ORDER — METOPROLOL TARTRATE 25 MG PO TABS
37.5000 mg | ORAL_TABLET | Freq: Two times a day (BID) | ORAL | Status: DC
  Administered 2019-11-06 (×2): 37.5 mg via ORAL
  Filled 2019-11-06 (×2): qty 1

## 2019-11-06 MED ORDER — METOCLOPRAMIDE HCL 10 MG PO TABS
5.0000 mg | ORAL_TABLET | Freq: Three times a day (TID) | ORAL | Status: DC
  Administered 2019-11-06 – 2019-11-07 (×3): 5 mg via ORAL
  Filled 2019-11-06 (×4): qty 1

## 2019-11-06 MED FILL — Heparin Sodium (Porcine) Inj 1000 Unit/ML: INTRAMUSCULAR | Qty: 10 | Status: AC

## 2019-11-06 MED FILL — Electrolyte-R (PH 7.4) Solution: INTRAVENOUS | Qty: 6000 | Status: AC

## 2019-11-06 MED FILL — Sodium Bicarbonate IV Soln 8.4%: INTRAVENOUS | Qty: 50 | Status: AC

## 2019-11-06 MED FILL — Sodium Chloride IV Soln 0.9%: INTRAVENOUS | Qty: 2000 | Status: AC

## 2019-11-06 MED FILL — Mannitol IV Soln 20%: INTRAVENOUS | Qty: 500 | Status: AC

## 2019-11-06 NOTE — Progress Notes (Signed)
[address]       [place] [address]             [phone]       3 Days Post-Op Procedure(s) (LRB):  CORONARY ARTERY BYPASS GRAFTING (CABG) using LIMA to LAD(m); Free RIMA to OM1 and OM2; Left radial artery to PDA; Right Greater Saphenous vein endoscopic harvested: SVG to Diag1; SVG to distal LAD. (N/A) RADIAL ARTERY HARVEST (Left) TRANSESOPHAGEAL ECHOCARDIOGRAM (TEE) (N/A) INDOCYANINE GREEN FLUORESCENCE IMAGING (ICG) (N/A) Endovein Harvest Of Greater Saphenous Vein of right LE. (Right) Subjective: Transferred from bed to chair without requiring assistance this morning. Pain controlled. Only concern is no BM since before surgery.   Objective: Vital signs in last 24 hours: Temp:  [97.9 F (36.6 C)-98.8 F (37.1 C)] 97.9 F (36.6 C) (02/26 0735) Pulse Rate:  [89-98] 95 (02/26 0735) Cardiac Rhythm: Normal sinus rhythm (02/26 0735) Resp:  [14-26] 23 (02/26 0735) BP: (93-140)/(68-95) 136/95 (02/26 0735) SpO2:  [91 %-98 %] 97 % (02/26 0735) Weight:  [93.8 kg] 93.8 kg (02/26 0418)   Intake/Output from previous day: 02/25 0701 - 02/26 0700 In: 69 [I.V.:31.8; IV Piggyback:37.1] Out: 3200 [Urine:3000; Chest Tube:200] Intake/Output this shift: Total I/O In: -  Out: 40 [Chest Tube:40]  General appearance: alert, cooperative and mild distress Neurologic: intact Heart: regular rate and rhythm Lungs: Breath sounds are clear, diminished bases. CXR shows expected post-op changes, low lung volumes.  Abdomen: Mild distennsion, not tender.  Extremities: Mild edema in LE's Wound: RLE EVH incisions intact and dry.   Lab Results: Recent Labs    11/04/19 1740 11/05/19 0457  WBC 8.6 8.3  HGB 9.6* 8.9*  HCT 28.9* 27.3*  PLT 147* 129*   BMET:  Recent Labs    11/05/19 0457 11/06/19 0152  NA 139 136  K 4.1 3.7  CL 105 98  CO2 26 26  GLUCOSE 120* 110*  BUN 10 12  CREATININE 0.91 1.01  CALCIUM 7.9* 8.0*    PT/INR:  Recent Labs    11/03/19 1513   LABPROT 18.9*  INR 1.6*   ABG    Component Value Date/Time   PHART 7.399 11/03/2019 2144   HCO3 23.2 11/03/2019 2144   TCO2 24 11/03/2019 2144   ACIDBASEDEF 1.0 11/03/2019 2144   O2SAT 99.0 11/03/2019 2144   CBG (last 3)  Recent Labs    11/05/19 1929 11/05/19 2317 11/06/19 0252  GLUCAP 84 91 106*    Assessment/Plan: S/P Procedure(s) (LRB): CORONARY ARTERY BYPASS GRAFTING (CABG) using LIMA to LAD(m); Free RIMA to OM1 and OM2; Left radial artery to PDA; Right Greater Saphenous vein endoscopic harvested: SVG to Diag1; SVG to distal LAD. (N/A) RADIAL ARTERY HARVEST (Left) TRANSESOPHAGEAL ECHOCARDIOGRAM (TEE) (N/A) INDOCYANINE GREEN FLUORESCENCE IMAGING (ICG) (N/A) Endovein Harvest Of Greater Saphenous Vein of right LE. (Right)  -POD3 CABG x 6 for MVCAD presenting with NSEMI, EF 55%. Progressing well, continue current RX, advance activity. D/C pacer wires this morning and will plan to remove the pleural and mediastinal tubes later today.  -Type 2 DM- HA1C 7.2 on admission. Glucose well controlled on SSI. Taking Glucophage, Jardiance, and Actos prior to admission. Resume Rx when oral intake stabilizes.   -H/O HTN-BP 136/95 this am. Agree with increasing BB. On Toprol, HCTZ,  lisinopril and amlodipine pre-op.   -Volume excess-  Wt +3kg from pre-op. Continue diuresis.  -Constipation- Dulcolax ordered.      LOS: 6 days    Leary Roca,  PA-C [phone] 11/06/2019

## 2019-11-06 NOTE — Plan of Care (Signed)
  Problem: Clinical Measurements: Goal: Ability to maintain clinical measurements within normal limits will improve Outcome: Progressing Goal: Will remain free from infection Outcome: Progressing Goal: Diagnostic test results will improve Outcome: Progressing Goal: Respiratory complications will improve Outcome: Progressing Goal: Cardiovascular complication will be avoided Outcome: Progressing   Problem: Activity: Goal: Risk for activity intolerance will decrease Outcome: Progressing   Problem: Nutrition: Goal: Adequate nutrition will be maintained Outcome: Progressing   Problem: Coping: Goal: Level of anxiety will decrease Outcome: Progressing   Problem: Elimination: Goal: Will not experience complications related to bowel motility Outcome: Progressing Goal: Will not experience complications related to urinary retention Outcome: Progressing   Problem: Pain Managment: Goal: General experience of comfort will improve Outcome: Progressing   Problem: Safety: Goal: Ability to remain free from injury will improve Outcome: Progressing   Problem: Skin Integrity: Goal: Risk for impaired skin integrity will decrease Outcome: Progressing   Problem: Education: Goal: Understanding of CV disease, CV risk reduction, and recovery process will improve Outcome: Progressing Goal: Individualized Educational Video(s) Outcome: Progressing   Problem: Activity: Goal: Ability to return to baseline activity level will improve Outcome: Progressing   Problem: Cardiovascular: Goal: Ability to achieve and maintain adequate cardiovascular perfusion will improve Outcome: Progressing Goal: Vascular access site(s) Level 0-1 will be maintained Outcome: Progressing   Problem: Health Behavior/Discharge Planning: Goal: Ability to safely manage health-related needs after discharge will improve Outcome: Progressing   Problem: Education: Goal: Will demonstrate proper wound care and an  understanding of methods to prevent future damage Outcome: Progressing Goal: Knowledge of disease or condition will improve Outcome: Progressing Goal: Knowledge of the prescribed therapeutic regimen will improve Outcome: Progressing Goal: Individualized Educational Video(s) Outcome: Progressing   Problem: Activity: Goal: Risk for activity intolerance will decrease Outcome: Progressing   Problem: Cardiac: Goal: Will achieve and/or maintain hemodynamic stability Outcome: Progressing   Problem: Clinical Measurements: Goal: Postoperative complications will be avoided or minimized Outcome: Progressing   Problem: Respiratory: Goal: Respiratory status will improve Outcome: Progressing   Problem: Skin Integrity: Goal: Wound healing without signs and symptoms of infection Outcome: Progressing Goal: Risk for impaired skin integrity will decrease Outcome: Progressing   Problem: Urinary Elimination: Goal: Ability to achieve and maintain adequate renal perfusion and functioning will improve Outcome: Progressing

## 2019-11-06 NOTE — Progress Notes (Signed)
CARDIAC REHAB PHASE I   PRE:  Rate/Rhythm: 101 ST  BP:  Supine:   Sitting: 137/90  Standing:    SaO2: 94 RA  MODE:  Ambulation: 600 ft   POST:  Rate/Rhythm: 116 ST  BP:  Supine:   Sitting: 139/99  Standing:    SaO2: 95 RA 1555-1620 On arrival pt in recliner. Assisted X 1 and used walker to ambulate. Gait steady with walker. Pt walked 600 feet without c/o. Pt ask to go back to bed after walk. Call light in reach. Pt c/o of some stomach pain and nausea before and after walk, belching.  Melina Copa RN 11/06/2019 4:16 PM

## 2019-11-06 NOTE — Progress Notes (Signed)
Progress Note  Patient Name: John May Date of Encounter: 11/06/2019  Primary Cardiologist: Olga Millers, MD   Subjective   Complaining of abdominal bloating, has not had a BM yet. Doing well with ambulation. No chest pain or shortness of breath.   Inpatient Medications    Scheduled Meds: . acetaminophen  1,000 mg Oral Q6H   Or  . acetaminophen (TYLENOL) oral liquid 160 mg/5 mL  1,000 mg Per Tube Q6H  . aspirin  81 mg Oral Daily  . bisacodyl  10 mg Oral Daily   Or  . bisacodyl  10 mg Rectal Daily  . Chlorhexidine Gluconate Cloth  6 each Topical Daily  . clopidogrel  75 mg Oral Daily  . docusate sodium  200 mg Oral Daily  . ezetimibe  10 mg Oral Daily  . furosemide  40 mg Intravenous Daily  . insulin aspart  0-24 Units Subcutaneous Q4H  . isosorbide dinitrate  10 mg Oral TID  . mouth rinse  15 mL Mouth Rinse BID  . metoprolol tartrate  25 mg Oral BID   Or  . metoprolol tartrate  25 mg Per Tube BID  . pantoprazole  40 mg Oral Daily  . pneumococcal 23 valent vaccine  0.5 mL Intramuscular Tomorrow-1000  . rosuvastatin  40 mg Oral q1800  . sodium chloride flush  10-40 mL Intracatheter Q12H  . sodium chloride flush  3 mL Intravenous Q12H   Continuous Infusions: . sodium chloride Stopped (11/04/19 0940)  . sodium chloride Stopped (11/05/19 1500)  . sodium chloride 20 mL/hr at 11/03/19 1515  . lactated ringers Stopped (11/05/19 1500)  . lactated ringers    . lactated ringers Stopped (11/04/19 1045)   PRN Meds: sodium chloride, dextrose, lactated ringers, levalbuterol, metoprolol tartrate, morphine injection, ondansetron (ZOFRAN) IV, oxyCODONE, sodium chloride flush, sodium chloride flush, traMADol   Vital Signs    Vitals:   11/05/19 1932 11/06/19 0300 11/06/19 0418 11/06/19 0735  BP: (!) 140/91 134/88  (!) 136/95  Pulse: 97 98  95  Resp: 14 (!) 26  (!) 23  Temp: 98.8 F (37.1 C) 98.6 F (37 C)  97.9 F (36.6 C)  TempSrc: Oral Oral  Oral  SpO2: 98% 94%  97%    Weight:   93.8 kg   Height:        Intake/Output Summary (Last 24 hours) at 11/06/2019 0757 Last data filed at 11/06/2019 0738 Gross per 24 hour  Intake 68.95 ml  Output 3240 ml  Net -3171.05 ml   Last 3 Weights 11/06/2019 11/05/2019 11/04/2019  Weight (lbs) 206 lb 12.7 oz 206 lb 12.7 oz 217 lb 6 oz  Weight (kg) 93.8 kg 93.8 kg 98.6 kg      Telemetry    NSR with intermittent PVCs - Personally Reviewed  ECG    No new tracings  - Personally Reviewed  Physical Exam   GEN: No acute distress.   Neck: No JVD Cardiac: RRR, no murmurs, rubs, or gallops.  Respiratory: Bibasilar crackles, otherwise clear throughout. GI: Soft, nontender, mild distension  MS: trace edema BLE; No deformity. Neuro:  Nonfocal  Psych: Normal affect   Labs    High Sensitivity Troponin:   Recent Labs  Lab 10/31/19 2007 10/31/19 2127  TROPONINIHS 273* 422*      Chemistry Recent Labs  Lab 10/31/19 2015 11/01/19 0502 11/03/19 0525 11/03/19 0803 11/04/19 1740 11/05/19 0457 11/06/19 0152  NA  --    < > 135   < >  138 139 136  K  --    < > 3.8   < > 4.1 4.1 3.7  CL  --    < > 100   < > 104 105 98  CO2  --    < > 22   < > 26 26 26   GLUCOSE  --    < > 224*   < > 137* 120* 110*  BUN  --    < > 16   < > 7 10 12   CREATININE  --    < > 1.20   < > 0.88 0.91 1.01  CALCIUM  --    < > 9.0   < > 7.9* 7.9* 8.0*  PROT 7.1  --  6.9  --   --   --   --   ALBUMIN 4.2  --  3.6  --   --   --   --   AST 28  --  29  --   --   --   --   ALT 30  --  41  --   --   --   --   ALKPHOS 73  --  71  --   --   --   --   BILITOT 0.7  --  0.8  --   --   --   --   GFRNONAA  --    < > >60   < > >60 >60 >60  GFRAA  --    < > >60   < > >60 >60 >60  ANIONGAP  --    < > 13   < > 8 8 12    < > = values in this interval not displayed.     Hematology Recent Labs  Lab 11/04/19 0423 11/04/19 1740 11/05/19 0457  WBC 9.2 8.6 8.3  RBC 3.34* 3.45* 3.22*  HGB 9.1* 9.6* 8.9*  HCT 27.7* 28.9* 27.3*  MCV 82.9 83.8 84.8  MCH  27.2 27.8 27.6  MCHC 32.9 33.2 32.6  RDW 12.8 13.0 13.1  PLT 122* 147* 129*    BNP Recent Labs  Lab 10/31/19 2029  BNP 87.7     DDimer No results for input(s): DDIMER in the last 168 hours.   Radiology    DG Chest 1 View  Result Date: 11/05/2019 CLINICAL DATA:  Open-heart surgery 11/03/2019 EXAM: CHEST  1 VIEW COMPARISON:  Yesterday FINDINGS: Low volume chest with atelectasis at the bases. Cardiomegaly that is stable. Chest tubes in place. Stable sheath positioning after Swan-Ganz catheter removal. Small right apical pneumothorax, 2 rib interspaces in height. IMPRESSION: 1. Interval small right apical pneumothorax. 2. Stable low volumes and atelectasis. Electronically Signed   By: 2030 M.D.   On: 11/05/2019 09:25   DG Chest Port 1 View  Result Date: 11/06/2019 CLINICAL DATA:  Open-heart surgery, history of CAD, diabetes, hypertension EXAM: PORTABLE CHEST 1 VIEW COMPARISON:  Radiograph 11/05/2019 FINDINGS: Interval removal of a right IJ catheter sheath. Mediastinal and pleural drains remain in place. Postsurgical changes from prior sternotomy with multiple CABG surgical clips and stable postoperative mediastinal widening. Slightly increased bandlike opacities in the lung bases compatible with atelectasis. No visible effusion or pneumothorax. No acute osseous or soft tissue abnormality. IMPRESSION: Slightly increased bibasilar atelectasis. Removal of the right IJ catheter sheath. Otherwise no acute significant oval change. Electronically Signed   By: 11/07/2019 M.D.   On: 11/06/2019 04:50    Cardiac Studies   TTE 2/21  1. Left ventricular ejection fraction, by estimation, is 55%. The left  ventricle has normal function. The left ventricle demonstrates regional  wall motion abnormalities (see scoring diagram/findings for description).  There is mild left ventricular  hypertrophy. Left ventricular diastolic parameters are indeterminate.  2. Right ventricular systolic  function is normal. The right ventricular  size is normal. Tricuspid regurgitation signal is inadequate for assessing  PA pressure.  3. The mitral valve is grossly normal. Trivial mitral valve  regurgitation.  4. The aortic valve is tricuspid. Aortic valve regurgitation is not  visualized.   Patient Profile     54 yo man with 53 yr h/o CAD s/p PCI in 2013 presented with [place] over the weekend. He is known to have diffuse residual disease which has been managed medically. Eval showed NSTEMI on this admission. He underwent LHC2/22demonstrating multivessel CAD. Subsequently underwent CABGx6 on 2/23.  Assessment & Plan    Principal Problem: Unstable angina (HCC) Active Problems: NSTEMI (non-ST elevated myocardial infarction) (HCC) S/P CABG x6 Coronary artery disease  Plan: Recovering well POD#3 from CABG. Vitals are stable, remains on room air. Chest tube output slowing down, will remove chest tubes later today. CXR with increased atelectasis, IS encouraged. Diuresed well yesterday with >3L output. Continuing with IV diuresis today.  Restarted low dose ACE. Increasing beta blocker for better rate control. Continue DAPT, statin and zetia.    For questions or updates, please contact [place] Please consult [email or web address] for contact info under        Signed, Delice Bison, DO  11/06/2019, 7:57 AM

## 2019-11-07 ENCOUNTER — Inpatient Hospital Stay (HOSPITAL_COMMUNITY): Payer: Medicare Other

## 2019-11-07 LAB — CBC
HCT: 30.9 % — ABNORMAL LOW (ref 39.0–52.0)
Hemoglobin: 10.2 g/dL — ABNORMAL LOW (ref 13.0–17.0)
MCH: 27.4 pg (ref 26.0–34.0)
MCHC: 33 g/dL (ref 30.0–36.0)
MCV: 83.1 fL (ref 80.0–100.0)
Platelets: 233 10*3/uL (ref 150–400)
RBC: 3.72 MIL/uL — ABNORMAL LOW (ref 4.22–5.81)
RDW: 12.6 % (ref 11.5–15.5)
WBC: 12.3 10*3/uL — ABNORMAL HIGH (ref 4.0–10.5)
nRBC: 0.2 % (ref 0.0–0.2)

## 2019-11-07 LAB — BASIC METABOLIC PANEL
Anion gap: 11 (ref 5–15)
BUN: 15 mg/dL (ref 6–20)
CO2: 27 mmol/L (ref 22–32)
Calcium: 8.2 mg/dL — ABNORMAL LOW (ref 8.9–10.3)
Chloride: 100 mmol/L (ref 98–111)
Creatinine, Ser: 1.03 mg/dL (ref 0.61–1.24)
GFR calc Af Amer: >60 mL/min (ref >60)
GFR calc non Af Amer: >60 mL/min (ref >60)
Glucose, Bld: 107 mg/dL — ABNORMAL HIGH (ref 70–99)
Potassium: 4 mmol/L (ref 3.5–5.1)
Sodium: 138 mmol/L (ref 135–145)

## 2019-11-07 LAB — GLUCOSE, CAPILLARY: Glucose-Capillary: 98 mg/dL (ref 70–99)

## 2019-11-07 MED ORDER — CLOPIDOGREL BISULFATE 75 MG PO TABS: 75.0000 mg | ORAL_TABLET | Freq: Every day | ORAL | 1 refills | Status: DC

## 2019-11-07 MED ORDER — CAPTOPRIL 12.5 MG PO TABS: 6.2500 mg | ORAL_TABLET | Freq: Three times a day (TID) | ORAL | 1 refills | Status: DC

## 2019-11-07 MED ORDER — ISOSORBIDE DINITRATE 10 MG PO TABS: 10.0000 mg | ORAL_TABLET | Freq: Three times a day (TID) | ORAL | 0 refills | Status: DC

## 2019-11-07 MED ORDER — METOPROLOL TARTRATE 50 MG PO TABS: 50.0000 mg | ORAL_TABLET | Freq: Two times a day (BID) | ORAL | 1 refills | Status: DC

## 2019-11-07 MED ORDER — METOPROLOL TARTRATE 25 MG/10 ML ORAL SUSPENSION: 50.0000 mg | Freq: Two times a day (BID) | ORAL | Status: DC

## 2019-11-07 MED ORDER — TRAMADOL HCL 50 MG PO TABS: 50.0000 mg | ORAL_TABLET | Freq: Four times a day (QID) | ORAL | 0 refills | Status: AC | PRN

## 2019-11-07 MED ORDER — METOPROLOL TARTRATE 50 MG PO TABS
50.0000 mg | ORAL_TABLET | Freq: Two times a day (BID) | ORAL | Status: DC
  Administered 2019-11-07: 50 mg via ORAL
  Filled 2019-11-07: qty 1

## 2019-11-07 NOTE — Discharge Instructions (Signed)

## 2019-11-07 NOTE — Progress Notes (Signed)
Explained and discussed discharge instructions given. Prescriptions sent electronically to [facility]. Follow up appointment given. No complaints at this time.

## 2019-11-07 NOTE — Progress Notes (Signed)
[address]       [place] [address]             [phone]      4 Days Post-Op Procedure(s) (LRB): CORONARY ARTERY BYPASS GRAFTING (CABG) using LIMA to LAD(m); Free RIMA to OM1 and OM2; Left radial artery to PDA; Right Greater Saphenous vein endoscopic harvested: SVG to Diag1; SVG to distal LAD. (N/A) RADIAL ARTERY HARVEST (Left) TRANSESOPHAGEAL ECHOCARDIOGRAM (TEE) (N/A) INDOCYANINE GREEN FLUORESCENCE IMAGING (ICG) (N/A) Endovein Harvest Of Greater Saphenous Vein of right LE. (Right) Subjective: Looks and feels well, no pain , having BM's  Objective: Vital signs in last 24 hours: Temp:  [97.9 F (36.6 C)-98.9 F (37.2 C)] 98.5 F (36.9 C) (02/27 0252) Pulse Rate:  [92-100] 92 (02/27 0252) Cardiac Rhythm: Normal sinus rhythm (02/27 0700) Resp:  [10-23] 10 (02/27 0252) BP: (118-160)/(77-98) 129/84 (02/27 0252) SpO2:  [94 %-99 %] 99 % (02/27 0252) Weight:  [89.8 kg] 89.8 kg (02/27 0252)  Hemodynamic parameters for last 24 hours:    Intake/Output from previous day: 02/26 0701 - 02/27 0700 In: 720 [P.O.:720] Out: 2480 [Urine:2350; Chest Tube:130] Intake/Output this shift: No intake/output data recorded.  General appearance: alert, cooperative and no distress Heart: regular rate and rhythm Lungs: clear to auscultation bilaterally Abdomen: benign Extremities: no edema Wound: incis healing well, left hand N/V intact  Lab Results: Recent Labs    11/06/19 0939 11/07/19 0302  WBC 11.5* 12.3*  HGB 9.9* 10.2*  HCT 29.8* 30.9*  PLT 172 233   BMET:  Recent Labs    11/06/19 0152 11/07/19 0302  NA 136 138  K 3.7 4.0  CL 98 100  CO2 26 27  GLUCOSE 110* 107*  BUN 12 15  CREATININE 1.01 1.03  CALCIUM 8.0* 8.2*    PT/INR: No results for input(s): LABPROT, INR in the last 72 hours. ABG    Component Value Date/Time   PHART 7.399 11/03/2019 2144   HCO3 23.2 11/03/2019 2144   TCO2 24 11/03/2019 2144   ACIDBASEDEF 1.0 11/03/2019 2144   O2SAT 99.0 11/03/2019 2144   CBG (last 3)  Recent Labs    11/06/19 1638 11/06/19 2111 11/07/19 0635  GLUCAP 97 137* 98    Meds Scheduled Meds: . acetaminophen  1,000 mg Oral Q6H   Or  . acetaminophen (TYLENOL) oral liquid 160 mg/5 mL  1,000 mg Per Tube Q6H  . aspirin  81 mg Oral Daily  . bisacodyl  10 mg Oral Daily   Or  . bisacodyl  10 mg Rectal Daily  . captopril  6.25 mg Oral TID  . Chlorhexidine Gluconate Cloth  6 each Topical Daily  . clopidogrel  75 mg Oral Daily  . docusate sodium  200 mg Oral Daily  . ezetimibe  10 mg Oral Daily  . furosemide  40 mg Intravenous Daily  . insulin aspart  0-24 Units Subcutaneous TID AC & HS  . isosorbide dinitrate  10 mg Oral TID  . mouth rinse  15 mL Mouth Rinse BID  . metoCLOPramide  5 mg Oral TID AC  . metoprolol tartrate  37.5 mg Oral BID   Or  . metoprolol tartrate  37.5 mg Per Tube BID  . pantoprazole  40 mg Oral Daily  . pneumococcal 23 valent vaccine  0.5 mL Intramuscular Tomorrow-1000  . potassium chloride  20 mEq Oral BID  . rosuvastatin  40 mg Oral q1800  . sodium chloride flush  10-40 mL  Intracatheter Q12H  . sodium chloride flush  3 mL Intravenous Q12H   Continuous Infusions: . sodium chloride Stopped (11/04/19 0940)  . sodium chloride Stopped (11/05/19 1500)  . sodium chloride 20 mL/hr at 11/03/19 1515  . lactated ringers Stopped (11/05/19 1500)  . lactated ringers    . lactated ringers Stopped (11/04/19 1045)   PRN Meds:.sodium chloride, dextrose, lactated ringers, levalbuterol, metoprolol tartrate, morphine injection, ondansetron (ZOFRAN) IV, oxyCODONE, sodium chloride flush, sodium chloride flush, traMADol  Xrays DG Chest Port 1 View  Result Date: 11/06/2019 CLINICAL DATA:  Open-heart surgery, history of CAD, diabetes, hypertension EXAM: PORTABLE CHEST 1 VIEW COMPARISON:  Radiograph 11/05/2019 FINDINGS: Interval removal of a right IJ catheter sheath. Mediastinal and pleural drains remain in place.  Postsurgical changes from prior sternotomy with multiple CABG surgical clips and stable postoperative mediastinal widening. Slightly increased bandlike opacities in the lung bases compatible with atelectasis. No visible effusion or pneumothorax. No acute osseous or soft tissue abnormality. IMPRESSION: Slightly increased bibasilar atelectasis. Removal of the right IJ catheter sheath. Otherwise no acute significant oval change. Electronically Signed   By: Kreg Shropshire M.D.   On: 11/06/2019 04:50    Assessment/Plan: S/P Procedure(s) (LRB): CORONARY ARTERY BYPASS GRAFTING (CABG) using LIMA to LAD(m); Free RIMA to OM1 and OM2; Left radial artery to PDA; Right Greater Saphenous vein endoscopic harvested: SVG to Diag1; SVG to distal LAD. (N/A) RADIAL ARTERY HARVEST (Left) TRANSESOPHAGEAL ECHOCARDIOGRAM (TEE) (N/A) INDOCYANINE GREEN FLUORESCENCE IMAGING (ICG) (N/A) Endovein Harvest Of Greater Saphenous Vein of right LE. (Right)   1 doing well 2 hemodyn stable in sinus rhythm , occ PVC, BP somewhat variable but mostly well controlled. Will increase lopressor to 50 bid, cont captopril/ nitrate for radial artery 3 ambulating well 4 off O2 5 BS control pretty good, transition to home meds 6 labs stable 7 appears stable for d/c   LOS: 7 days    [facility] 11/07/2019 Pager [phone]

## 2019-11-07 NOTE — Plan of Care (Signed)
  Problem: Clinical Measurements: Goal: Ability to maintain clinical measurements within normal limits will improve Outcome: Completed/Met Goal: Will remain free from infection Outcome: Completed/Met Goal: Diagnostic test results will improve Outcome: Completed/Met Goal: Respiratory complications will improve Outcome: Completed/Met Goal: Cardiovascular complication will be avoided Outcome: Completed/Met   Problem: Activity: Goal: Risk for activity intolerance will decrease Outcome: Completed/Met   Problem: Nutrition: Goal: Adequate nutrition will be maintained Outcome: Completed/Met   Problem: Coping: Goal: Level of anxiety will decrease Outcome: Completed/Met   Problem: Elimination: Goal: Will not experience complications related to bowel motility Outcome: Completed/Met Goal: Will not experience complications related to urinary retention Outcome: Completed/Met   Problem: Pain Managment: Goal: General experience of comfort will improve Outcome: Completed/Met   Problem: Safety: Goal: Ability to remain free from injury will improve Outcome: Completed/Met   Problem: Skin Integrity: Goal: Risk for impaired skin integrity will decrease Outcome: Completed/Met   Problem: Education: Goal: Understanding of CV disease, CV risk reduction, and recovery process will improve Outcome: Completed/Met Goal: Individualized Educational Video(s) Outcome: Completed/Met   Problem: Activity: Goal: Ability to return to baseline activity level will improve Outcome: Completed/Met   Problem: Cardiovascular: Goal: Ability to achieve and maintain adequate cardiovascular perfusion will improve Outcome: Completed/Met Goal: Vascular access site(s) Level 0-1 will be maintained Outcome: Completed/Met   Problem: Health Behavior/Discharge Planning: Goal: Ability to safely manage health-related needs after discharge will improve Outcome: Completed/Met   Problem: Education: Goal: Will  demonstrate proper wound care and an understanding of methods to prevent future damage Outcome: Completed/Met Goal: Knowledge of disease or condition will improve Outcome: Completed/Met Goal: Knowledge of the prescribed therapeutic regimen will improve Outcome: Completed/Met Goal: Individualized Educational Video(s) Outcome: Completed/Met   Problem: Activity: Goal: Risk for activity intolerance will decrease Outcome: Completed/Met   Problem: Cardiac: Goal: Will achieve and/or maintain hemodynamic stability Outcome: Completed/Met   Problem: Clinical Measurements: Goal: Postoperative complications will be avoided or minimized Outcome: Completed/Met   Problem: Respiratory: Goal: Respiratory status will improve Outcome: Completed/Met   Problem: Skin Integrity: Goal: Wound healing without signs and symptoms of infection Outcome: Completed/Met Goal: Risk for impaired skin integrity will decrease Outcome: Completed/Met   Problem: Urinary Elimination: Goal: Ability to achieve and maintain adequate renal perfusion and functioning will improve Outcome: Completed/Met

## 2019-11-07 NOTE — Progress Notes (Signed)
Pt currently being discharged. Briefly spoke with pt regarding CRPII. Will send referrall to [place] [facility]. Left pt with brochure and education materials. Pt verbalized understanding.   York Cerise MS, ACSM CEP 10:00 AM 11/07/2019

## 2019-11-09 ENCOUNTER — Other Ambulatory Visit (HOSPITAL_COMMUNITY): Payer: Self-pay

## 2019-11-09 DIAGNOSIS — Z951 Presence of aortocoronary bypass graft: Secondary | ICD-10-CM

## 2019-11-12 ENCOUNTER — Encounter (HOSPITAL_COMMUNITY): Payer: Self-pay | Admitting: *Deleted

## 2019-11-12 ENCOUNTER — Other Ambulatory Visit: Payer: Self-pay | Admitting: Cardiothoracic Surgery

## 2019-11-12 DIAGNOSIS — Z951 Presence of aortocoronary bypass graft: Secondary | ICD-10-CM

## 2019-11-12 NOTE — Progress Notes (Signed)
Referral received and verified for MD signature.  Follow up appt  with Cardiology 11/23/19  and 11/16/19 with CV surgical. Insurance benefits and eligibility to be determined. Pt will be contacted and scheduled upon the satisfactorily completion of both follow up appts. Alanson Aly, BSN Cardiac and Emergency planning/management officer

## 2019-11-13 ENCOUNTER — Telehealth: Payer: Self-pay

## 2019-11-13 NOTE — Telephone Encounter (Signed)
Mr Deguire called C/O dry cough and it's causing soreness in chest area. He denies any SOB, fevers, green/yellow mucus. Recommended taking Mucinex BID over the weekend and continuing Spirometer daily. He is scheduled to see Dr Vickey Sages on 11/16/2019 with CXR . He will discuss further with Dr Renaldo Fiddler at appt. With cough does not improve over the weekend

## 2019-11-15 LAB — ECHO INTRAOPERATIVE TEE
AV Mean grad: 3 mmHg
Ao-prox: 3
Height: 67 in
LVOT diameter: 22 mm
Mean grad: 0 mmHg
STJ: 2.3 cm
Sinus: 2.7 cm
Weight: 3185.6 oz

## 2019-11-16 ENCOUNTER — Ambulatory Visit (INDEPENDENT_AMBULATORY_CARE_PROVIDER_SITE_OTHER): Payer: Self-pay | Admitting: Cardiothoracic Surgery

## 2019-11-16 ENCOUNTER — Ambulatory Visit
Admission: RE | Admit: 2019-11-16 | Discharge: 2019-11-16 | Disposition: A | Discharge status: Home / Self Care | Payer: Medicare Other | Source: Ambulatory Visit | Attending: Cardiothoracic Surgery | Admitting: Cardiothoracic Surgery

## 2019-11-16 ENCOUNTER — Telehealth (HOSPITAL_COMMUNITY): Payer: Self-pay

## 2019-11-16 ENCOUNTER — Encounter: Payer: Self-pay | Admitting: Cardiothoracic Surgery

## 2019-11-16 ENCOUNTER — Other Ambulatory Visit: Payer: Self-pay

## 2019-11-16 VITALS — BP 121/76 | HR 99 | Temp 97.7°F | Resp 16 | Ht 67.0 in | Wt 194.2 lb

## 2019-11-16 DIAGNOSIS — I214 Non-ST elevation (NSTEMI) myocardial infarction: Secondary | ICD-10-CM

## 2019-11-16 DIAGNOSIS — Z951 Presence of aortocoronary bypass graft: Secondary | ICD-10-CM

## 2019-11-16 MED ORDER — LISINOPRIL 5 MG PO TABS: 5.0000 mg | ORAL_TABLET | Freq: Every day | ORAL | 11 refills | Status: DC

## 2019-11-16 NOTE — Telephone Encounter (Signed)
Pt insurance is active and benefits verified through Medicare a/b Co-pay 0, DED $203/$203 met, out of pocket 0/0 met, co-insurance 20%. no pre-authorization required. Passport, 11/16/2019@11 :Josefa Half, REF# [id number]  Will contact patient to see if he is interested in the Cardiac Rehab Program. If interested, patient will need to complete follow up appt. Once completed, patient will be contacted for scheduling upon review by the RN Navigator.  2ndary insurance is active and benefits verified through [facility]. Co-pay $25, DED $500/0 met, out of pocket $3,000/$138.85 met, co-insurance 0%. No pre-authorization required. Passport, 11/16/2019@11 :10am, REF# [id number]

## 2019-11-16 NOTE — Progress Notes (Signed)
      [address]       [place] [address]             [phone]     CARDIOTHORACIC SURGERY OFFICE NOTE  Referring Provider is Jens Som, Madolyn Frieze, MD Primary Cardiologist is Olga Millers, MD PCP is Fleet Contras, MD   HPI:  54 yo man s/p CABG x 6 approximately 2 weeks ago. Doing well; without complaints of sternal pain, angina or SOB.  Adequate appetite. Doing light activities around the house without difficulty.   Current Outpatient Medications  Medication Sig Dispense Refill  . aspirin EC 81 MG tablet Take 81 mg by mouth daily.    . clopidogrel (PLAVIX) 75 MG tablet Take 1 tablet (75 mg total) by mouth daily. 30 tablet 1  . ezetimibe (ZETIA) 10 MG tablet TAKE 1 TABLET BY MOUTH EVERY DAY (Patient taking differently: Take 10 mg by mouth daily. ) 30 tablet 6  . isosorbide dinitrate (ISORDIL) 10 MG tablet Take 1 tablet (10 mg total) by mouth 3 (three) times daily. 90 tablet 0  . JARDIANCE 25 MG TABS tablet Take 25 mg by mouth daily.  5  . metoprolol tartrate (LOPRESSOR) 50 MG tablet Take 1 tablet (50 mg total) by mouth 2 (two) times daily. 60 tablet 1  . pioglitazone (ACTOS) 30 MG tablet Take 30 mg by mouth daily.  5  . rosuvastatin (CRESTOR) 40 MG tablet TAKE 1 TABLET BY MOUTH EVERY DAY (Patient taking differently: Take 40 mg by mouth daily. ) 30 tablet 10  . Vitamin D, Ergocalciferol, (DRISDOL) 50000 units CAPS capsule Take 50,000 Units by mouth once a week. Sundays  5  . lisinopril (ZESTRIL) 5 MG tablet Take 1 tablet (5 mg total) by mouth daily. 30 tablet 11  . metFORMIN (GLUCOPHAGE) 1000 MG tablet Take 1,000 mg by mouth 2 (two) times daily.  5   No current facility-administered medications for this visit.      Physical Exam:   BP 121/76 (BP Location: Left Arm, Patient Position: Sitting, Cuff Size: Normal)   Pulse 99   Temp 97.7 F (36.5 C)   Resp 16   Ht 5\' 7" (1.702 m)   Wt 88.1 kg   SpO2 99% Comment: RA  BMI 30.42 kg/m    General:  Well-appearing, NAD  Chest:   cta  CV:   rrr  Incisions:  Well-healed  Abdomen:  sntnd  Extremities:  Mild edema  Diagnostic Tests:  CXR with clear lung fields   Impression:  Doing well after CABG except for mild, nagging cough  Plan:  Switch captopril for lisinopril F/u in 2 weeks with CXR Continue to observe sternal precautions   I spent in excess of 20  minutes during the conduct of this office consultation and >50% of this time involved direct face-to-face encounter with the patient for counseling and/or coordination of their care.  Level 2                 10 minutes Level 3                 15 minutes Level 4                 25 minutes Level 5                 40  minutes  B. , MD 11/16/2019 1:08 PM

## 2019-11-22 NOTE — Progress Notes (Signed)
Cardiology Clinic Note   Patient Name: John May Date of Encounter: 11/23/2019  Primary Care Provider:  Fleet Contras, MD Primary Cardiologist:  Olga Millers, MD  Patient Profile    John Bouchard. May 54 year old male presents today for follow-up status post CABG x6 11/03/2019.  Past Medical History    Past Medical History:  Diagnosis Date  . CAD (coronary artery disease)    a. 01/2010 - MI with unsuccessful PCI of RCA. b. s/p PCI/DES to mid RCA 05/2012. c. Cath 11/2012 for abnl stress test - diffuse branch and distal vessel CAD for med rx.    . Diabetes mellitus (HCC)    a. A1c 6.9 in 11/2012.  Marland Kitchen Hyperlipidemia   . Hypertension   . OSA on CPAP   . Paresthesias    a. bilat upper & lower extremities - intermittently present since 2011.   Past Surgical History:  Procedure Laterality Date  . CORONARY ANGIOPLASTY WITH STENT PLACEMENT  05/14/2012   "1"  . CORONARY ARTERY BYPASS GRAFT N/A 11/03/2019   Procedure: CORONARY ARTERY BYPASS GRAFTING (CABG) using LIMA to LAD(m); Free RIMA to OM1 and OM2; Left radial artery to PDA; Right Greater Saphenous vein endoscopic harvested: SVG to Diag1; SVG to distal LAD.;  Surgeon: Linden Dolin, MD;  Location: [facility] OR;  Service: Open Heart Surgery;  Laterality: N/A;  Left UE (arm) open radial artery harvest; Right LE  (leg) endoscopic harvest.  . ENDOVEIN HARVEST OF GREATER SAPHENOUS VEIN Right 11/03/2019   Procedure: Mack Guise Of Greater Saphenous Vein of right LE.;  Surgeon: Linden Dolin, MD;  Location: [facility] OR;  Service: Open Heart Surgery;  Laterality: Right;  . LEFT HEART CATH AND CORONARY ANGIOGRAPHY N/A 11/02/2019   Procedure: LEFT HEART CATH AND CORONARY ANGIOGRAPHY;  Surgeon: Kathleene Hazel, MD;  Location: [facility] INVASIVE CV LAB;  Service: Cardiovascular;  Laterality: N/A;  . LEFT HEART CATHETERIZATION WITH CORONARY ANGIOGRAM N/A 11/25/2012   Procedure: LEFT HEART CATHETERIZATION WITH CORONARY ANGIOGRAM;  Surgeon: Laurey Morale,  MD;  Location: [facility] CATH LAB;  Service: Cardiovascular;  Laterality: N/A;  . PATELLAR TENDON REPAIR  1990's   left  . PERCUTANEOUS CORONARY STENT INTERVENTION (PCI-S) N/A 05/14/2012   Procedure: PERCUTANEOUS CORONARY STENT INTERVENTION (PCI-S);  Surgeon: Kathleene Hazel, MD;  Location: [facility] CATH LAB;  Service: Cardiovascular;  Laterality: N/A;  . RADIAL ARTERY HARVEST Left 11/03/2019   Procedure: RADIAL ARTERY HARVEST;  Surgeon: Linden Dolin, MD;  Location: [facility] OR;  Service: Open Heart Surgery;  Laterality: Left;  . TEE WITHOUT CARDIOVERSION N/A 11/03/2019   Procedure: TRANSESOPHAGEAL ECHOCARDIOGRAM (TEE);  Surgeon: Linden Dolin, MD;  Location: [facility] OR;  Service: Open Heart Surgery;  Laterality: N/A;    Allergies  Allergies  Allergen Reactions  . Bee Venom Swelling  . Food Allergy Formula Swelling    "Crab in stuffing; I eat shrimp and I don't have problems"  . Crab [Shellfish Allergy]   . Nutritional Supplements Swelling    unknown    History of Present Illness   Mr. John May has a PMH of CAD status post DES x1 to his RCA 2013 with diffuse disease that was managed medically.  Diabetes mellitus type 2, hyperlipidemia, HTN, OSA on CPAP.  Admitted to [facility] 10/31/2019-11/09/2019 with unstable angina.  He underwent left heart cath which showed multivessel disease.  He was referred to CVTS subsequently had a CABG x6 on 11/03/2019 without complications (LIMA to LAD, left radial to PDA, Right IMA to  first and second OM, SVG to apical LAD and first diagonal).  His echocardiogram 11/01/2019 showed an LVEF of 55%, mild LVH, intermediate diastolic parameters, tricuspid regurgitation, and trivial mitral valve regurgitation.  He experienced a cough on captopril and was switched to lisinopril.  He presents today for follow-up evaluation and states he has not had any further chest pain since this open heart surgery.  He states that he had not been eating a heart healthy diet due to his autistic son having  to care for him.  He states that now he is eating a low-sodium heart healthy diet walking regularly and taking his medications.  He does have a productive cough.  He states that the cough has gotten much better since being discharged from the hospital and be sputum has been clear/white.  He has been using his incentive spirometer several times per day and is able to achieve 2250.  He states he feels his medications have been changed and he is requesting a clear medication list.  I will give him the salty 6 sheet, have his lipids repeated in 3 months, and have him follow-up with Dr. Jens Som in 3 months.  Today he  denies chest pain, shortness of breath, lower extremity edema, fatigue, palpitations, melena, hematuria, hemoptysis, diaphoresis, weakness, presyncope, syncope, orthopnea, and PND.   Home Medications    Prior to Admission medications   Medication Sig Start Date End Date Taking? Authorizing Provider  aspirin EC 81 MG tablet Take 81 mg by mouth daily.    [provider]  clopidogrel (PLAVIX) 75 MG tablet Take 1 tablet (75 mg total) by mouth daily. 11/07/19   Gold, Glenice Laine, PA-C  ezetimibe (ZETIA) 10 MG tablet TAKE 1 TABLET BY MOUTH EVERY DAY Patient taking differently: Take 10 mg by mouth daily.  11/25/18   Lewayne Bunting, MD  isosorbide dinitrate (ISORDIL) 10 MG tablet Take 1 tablet (10 mg total) by mouth 3 (three) times daily. 11/07/19   Gold, Wayne E, PA-C  JARDIANCE 25 MG TABS tablet Take 25 mg by mouth daily. 04/01/18   [provider]  lisinopril (ZESTRIL) 5 MG tablet Take 1 tablet (5 mg total) by mouth daily. 11/16/19 11/15/20  Linden Dolin, MD  metFORMIN (GLUCOPHAGE) 1000 MG tablet Take 1,000 mg by mouth 2 (two) times daily. 03/17/18   [provider]  metoprolol tartrate (LOPRESSOR) 50 MG tablet Take 1 tablet (50 mg total) by mouth 2 (two) times daily. 11/07/19   Gold, Wayne E, PA-C  pioglitazone (ACTOS) 30 MG tablet Take 30 mg by mouth daily. 06/02/17    [provider]  rosuvastatin (CRESTOR) 40 MG tablet TAKE 1 TABLET BY MOUTH EVERY DAY Patient taking differently: Take 40 mg by mouth daily.  10/19/19   Lewayne Bunting, MD  Vitamin D, Ergocalciferol, (DRISDOL) 50000 units CAPS capsule Take 50,000 Units by mouth once a week. Sundays 05/30/17   [provider]    Family History    Family History  Problem Relation Age of Onset  . Hypertension Mother   . Cancer Mother   . Diabetes Mother   . Stroke Mother   . Cancer Father    He indicated that his mother is alive. He indicated that his father is deceased.  Social History    Social History   Socioeconomic History  . Marital status: Divorced    Spouse name: Not on file  . Number of children: 2  . Years of education: Not on file  .  Highest education level: Not on file  Occupational History    Employer: UNEMPLOYED  Tobacco Use  . Smoking status: Former Smoker    Packs/day: 0.50    Years: 18.00    Pack years: 9.00    Types: Cigarettes    Quit date: 11/26/2003    Years since quitting: 16.0  . Smokeless tobacco: Never Used  Substance and Sexual Activity  . Alcohol use: No    Alcohol/week: 0.0 standard drinks  . Drug use: No  . Sexual activity: Yes  Other Topics Concern  . Not on file  Social History Narrative  . Not on file   Social Determinants of Health   Financial Resource Strain:   . Difficulty of Paying Living Expenses:   Food Insecurity:   . Worried About Programme researcher, broadcasting/film/video in the Last Year:   . Barista in the Last Year:   Transportation Needs:   . Freight forwarder (Medical):   Marland Kitchen Lack of Transportation (Non-Medical):   Physical Activity:   . Days of Exercise per Week:   . Minutes of Exercise per Session:   Stress:   . Feeling of Stress :   Social Connections:   . Frequency of Communication with Friends and Family:   . Frequency of Social Gatherings with Friends and Family:   . Attends Religious Services:   . Active Member  of Clubs or Organizations:   . Attends Banker Meetings:   Marland Kitchen Marital Status:   Intimate Partner Violence:   . Fear of Current or Ex-Partner:   . Emotionally Abused:   Marland Kitchen Physically Abused:   . Sexually Abused:      Review of Systems    General:  No chills, fever, night sweats or weight changes.  Cardiovascular:  No chest pain, dyspnea on exertion, edema, orthopnea, palpitations, paroxysmal nocturnal dyspnea. Dermatological: No rash, lesions/masses Respiratory: No cough, dyspnea Urologic: No hematuria, dysuria Abdominal:   No nausea, vomiting, diarrhea, bright red blood per rectum, melena, or hematemesis Neurologic:  No visual changes, wkns, changes in mental status. All other systems reviewed and are otherwise negative except as noted above.  Physical Exam    VS:  BP 122/78   Pulse 91   Ht 5\' 7"  (1.702 m)   Wt 188 lb 9.6 oz (85.5 kg)   SpO2 94%   BMI 29.54 kg/m  , BMI Body mass index is 29.54 kg/m. GEN: Well nourished, well developed, in no acute distress. HEENT: normal. Neck: Supple, no JVD, carotid bruits, or masses. Cardiac: RRR, no murmurs, rubs, or gallops. No clubbing, cyanosis, edema.  Radials/DP/PT 2+ and equal bilaterally.  Respiratory:  Respirations regular and unlabored, clear to auscultation bilaterally. GI: Soft, nontender, nondistended, BS + x 4. MS: no deformity or atrophy. Skin: warm and dry, no rash.  Sternal incision clean dry intact, no drainage.  Chest tube sites clean dry intact no drainage.  Harvest sites (left radial and right leg) clean dry intact no drainage all open to air. Neuro:  Strength and sensation are intact. Psych: Normal affect.  Accessory Clinical Findings    ECG personally reviewed by me today-sinus rhythm with premature supraventricular complexes inferior infarct undetermined age anterior lateral infarct undetermined age 70 bpm no acute changes.  EKG 11/04/2019 Sinus rhythm with PACs inferior MI undetermined age 67  bpm  Echocardiogram 11/01/2019 IMPRESSIONS    1. Left ventricular ejection fraction, by estimation, is 55%. The left  ventricle has normal function. The left ventricle  demonstrates regional  wall motion abnormalities (see scoring diagram/findings for description).  There is mild left ventricular  hypertrophy. Left ventricular diastolic parameters are indeterminate.  2. Right ventricular systolic function is normal. The right ventricular  size is normal. Tricuspid regurgitation signal is inadequate for assessing  PA pressure.  3. The mitral valve is grossly normal. Trivial mitral valve  regurgitation.  4. The aortic valve is tricuspid. Aortic valve regurgitation is not  visualized.   Assessment & Plan   1.  Status post CABG-CABG x6 on 9/37/3428 without complications (LIMA to LAD, left radial to PDA, Right IMA to first and second OM, SVG to apical LAD and first diagonal).  His echocardiogram 11/01/2019 showed an LVEF of 55%, mild LVH, intermediate diastolic parameters, tricuspid regurgitation, and trivial mitral valve regurgitation. Continue aspirin 81 mg tablet daily Continue Plavix 75 mg tablet daily Continue isosorbide dinitrate 10 mg tablet 3 times daily Continue lisinopril 5 mg tablet daily Continue metoprolol tartrate 50 mg twice daily Increase physical activity as tolerated maintain sternal precautions Heart healthy low-sodium diet  Essential hypertension-BP today 122/78.  Well-controlled at home Continue lisinopril 5 mg tablet daily Continue metoprolol tartrate 50 mg twice daily Heart healthy low-sodium diet-salty 6 given Increase physical activity as tolerated maintain sternal precautions  Hyperlipidemia-11/01/2019: Cholesterol 192; HDL 27; LDL Cholesterol 115; Triglycerides 252; VLDL 50 Continue ezetimibe 10 mg tablet daily Continue rosuvastatin 40 mg tablet daily Heart healthy low-sodium high-fiber diet Increase physical activity as tolerated maintain sternal  precautions Repeat lipid panel and LFTs in 2 months.  Type 2 diabetes-A1c 7.2  11/01/19 Metformin 1000 mg twice daily Continue Actos 30 mg daily Followed by PCP  AKI-creatinine 1.03 11/07/2019 Resolved  Disposition: Follow-up with Dr. Stanford Breed in 3 months.  Jossie Ng. [facility] [address] Office [phone] Fax [phone]

## 2019-11-23 ENCOUNTER — Ambulatory Visit (INDEPENDENT_AMBULATORY_CARE_PROVIDER_SITE_OTHER): Payer: Medicare Other | Admitting: General Practice

## 2019-11-23 ENCOUNTER — Encounter: Payer: Self-pay | Admitting: General Practice

## 2019-11-23 ENCOUNTER — Other Ambulatory Visit: Payer: Self-pay

## 2019-11-23 VITALS — BP 122/78 | HR 91 | Ht 67.0 in | Wt 188.6 lb

## 2019-11-23 DIAGNOSIS — I10 Essential (primary) hypertension: Secondary | ICD-10-CM | POA: Diagnosis not present

## 2019-11-23 DIAGNOSIS — E118 Type 2 diabetes mellitus with unspecified complications: Secondary | ICD-10-CM | POA: Diagnosis not present

## 2019-11-23 DIAGNOSIS — Z951 Presence of aortocoronary bypass graft: Secondary | ICD-10-CM | POA: Diagnosis not present

## 2019-11-23 DIAGNOSIS — N179 Acute kidney failure, unspecified: Secondary | ICD-10-CM

## 2019-11-23 DIAGNOSIS — E782 Mixed hyperlipidemia: Secondary | ICD-10-CM | POA: Diagnosis not present

## 2019-11-23 NOTE — Patient Instructions (Addendum)
Medication Instructions:  Your physician recommends that you continue on your current medications as directed. Please refer to the Current Medication list given to you today.  *If you need a refill on your cardiac medications before your next appointment, please call your pharmacy*   Lab Work: TO BE DONE IN 3 MONTHS: LIPIDS, LFT If you have labs (blood work) drawn today and your tests are completely normal, you will receive your results only by: Marland Kitchen MyChart Message (if you have MyChart) OR . A paper copy in the mail If you have any lab test that is abnormal or we need to change your treatment, we will call you to review the results.   Testing/Procedures: NONE   Follow-Up: At [facility], you and your health needs are our priority.  As part of our continuing mission to provide you with exceptional heart care, we have created designated Provider Care Teams.  These Care Teams include your primary Cardiologist (physician) and Advanced Practice Providers (APPs -  Physician Assistants and Nurse Practitioners) who all work together to provide you with the care you need, when you need it.  We recommend signing up for the patient portal called "MyChart".  Sign up information is provided on this After Visit Summary.  MyChart is used to connect with patients for Virtual Visits (Telemedicine).  Patients are able to view lab/test results, encounter notes, upcoming appointments, etc.  Non-urgent messages can be sent to your provider as well.   To learn more about what you can do with MyChart, go to [email or web address].    Your next appointment:   3 month(s)  The format for your next appointment:   In Person  Provider:   Olga Millers, MD   Other Instructions

## 2019-11-26 ENCOUNTER — Other Ambulatory Visit: Payer: Self-pay | Admitting: Cardiothoracic Surgery

## 2019-11-26 DIAGNOSIS — Z951 Presence of aortocoronary bypass graft: Secondary | ICD-10-CM

## 2019-11-30 ENCOUNTER — Ambulatory Visit: Payer: Self-pay | Admitting: Cardiothoracic Surgery

## 2019-12-07 ENCOUNTER — Ambulatory Visit (INDEPENDENT_AMBULATORY_CARE_PROVIDER_SITE_OTHER): Payer: Self-pay | Admitting: Cardiothoracic Surgery

## 2019-12-07 ENCOUNTER — Other Ambulatory Visit: Payer: Self-pay

## 2019-12-07 ENCOUNTER — Ambulatory Visit
Admission: RE | Admit: 2019-12-07 | Discharge: 2019-12-07 | Disposition: A | Discharge status: Home / Self Care | Payer: Medicare Other | Source: Ambulatory Visit | Attending: Cardiothoracic Surgery | Admitting: Cardiothoracic Surgery

## 2019-12-07 VITALS — BP 135/90 | HR 103 | Temp 98.1°F | Resp 20 | Ht 67.0 in | Wt 189.0 lb

## 2019-12-07 DIAGNOSIS — Z951 Presence of aortocoronary bypass graft: Secondary | ICD-10-CM

## 2019-12-07 DIAGNOSIS — I214 Non-ST elevation (NSTEMI) myocardial infarction: Secondary | ICD-10-CM

## 2019-12-07 NOTE — Progress Notes (Signed)
[address]       [place] [address]             [phone]     CARDIOTHORACIC SURGERY OFFICE NOTE  Referring Provider is Jens Som, Madolyn Frieze, MD Primary Cardiologist is Olga Millers, MD PCP is Fleet Contras, MD   HPI:  54 yo man underwent CABG x 6 several weeks ago. He did very well after surgery and was discharged home in good condition. He presents for 2nd f/u appt now. Had a productive cough for a few weeks, but this appears to be improving.   Current Outpatient Medications  Medication Sig Dispense Refill  . aspirin EC 81 MG tablet Take 81 mg by mouth daily.    . clopidogrel (PLAVIX) 75 MG tablet Take 1 tablet (75 mg total) by mouth daily. 30 tablet 1  . ezetimibe (ZETIA) 10 MG tablet TAKE 1 TABLET BY MOUTH EVERY DAY (Patient taking differently: Take 10 mg by mouth daily. ) 30 tablet 6  . isosorbide dinitrate (ISORDIL) 10 MG tablet Take 1 tablet (10 mg total) by mouth 3 (three) times daily. 90 tablet 0  . JARDIANCE 25 MG TABS tablet Take 25 mg by mouth daily.  5  . lisinopril (ZESTRIL) 5 MG tablet Take 1 tablet (5 mg total) by mouth daily. 30 tablet 11  . metFORMIN (GLUCOPHAGE) 1000 MG tablet Take 1,000 mg by mouth 2 (two) times daily.  5  . metoprolol tartrate (LOPRESSOR) 50 MG tablet Take 1 tablet (50 mg total) by mouth 2 (two) times daily. 60 tablet 1  . pioglitazone (ACTOS) 30 MG tablet Take 30 mg by mouth daily.  5  . rosuvastatin (CRESTOR) 40 MG tablet TAKE 1 TABLET BY MOUTH EVERY DAY (Patient taking differently: Take 40 mg by mouth daily. ) 30 tablet 10  . Vitamin D, Ergocalciferol, (DRISDOL) 50000 units CAPS capsule Take 50,000 Units by mouth once a week. Sundays  5   No current facility-administered medications for this visit.      Physical Exam:   BP 135/90 (BP Location: Left Arm)   Pulse (!) 103   Temp 98.1 F (36.7 C) (Temporal)   Resp 20   Ht 5\' 7" (1.702 m)   Wt 85.7 kg   SpO2 98% Comment: RA  BMI 29.60 kg/m    General:  Well-appearing, NAD  Chest:   cta  CV:   rrr  Incisions:  C/d/i  Abdomen:  sntnd  Extremities:  No edema  Diagnostic Tests:  CXR with clear lung fields   Impression:  Doing very well after CABG  Plan:  F/u as needed Ok to drive and resume normal physical activity F/u with Dr. Crenshaw Can reassess cough and possible relation to ACE-I Stop isordil  I spent in excess of 15 minutes during the conduct of this office consultation and >50% of this time involved direct face-to-face encounter with the patient for counseling and/or coordination of their care.  Level 2                 10 minutes Level 3                 15 minutes Level 4                 25 minutes Level 5                 40  minutes  B. , MD 12/07/2019 10:16 AM

## 2019-12-08 NOTE — Telephone Encounter (Signed)
Called pt to see if he was interested in participating in the cardiac rehab program, pt stated that he has an autistic son and he doesn't have the time. Offered the virtual program to pt and explained in detail of how it worked, pt declined that as well. Closed referral.

## 2020-01-01 ENCOUNTER — Telehealth (HOSPITAL_COMMUNITY): Payer: Self-pay

## 2020-01-01 NOTE — Telephone Encounter (Signed)
Pt called and stated that he would like to do the cardiac rehab program, advised pt of his insurance and what he would be responsible for and pt stated that he was a single dad and that he has other bills and responsibilities and that he would think about it again and call back next week with a decision. Placed pt ppw in the ready to sch folder.

## 2020-01-03 ENCOUNTER — Other Ambulatory Visit: Payer: Self-pay | Admitting: Surgical

## 2020-01-04 ENCOUNTER — Telehealth: Payer: Self-pay | Admitting: Cardiology

## 2020-01-04 ENCOUNTER — Other Ambulatory Visit: Payer: Self-pay

## 2020-01-04 MED ORDER — CLOPIDOGREL BISULFATE 75 MG PO TABS: 75.0000 mg | ORAL_TABLET | Freq: Every day | ORAL | 3 refills | Status: DC

## 2020-01-04 MED ORDER — METOPROLOL TARTRATE 50 MG PO TABS: 50.0000 mg | ORAL_TABLET | Freq: Two times a day (BID) | ORAL | 3 refills | Status: DC

## 2020-01-04 NOTE — Telephone Encounter (Signed)
Pt c/o medication issue:  1. Name of Medication:  clopidogrel (PLAVIX) 75 MG tablet metoprolol tartrate (LOPRESSOR) 50 MG tablet  2. How are you currently taking this medication (dosage and times per day)? As directed   3. Are you having a reaction (difficulty breathing--STAT)? no  4. What is your medication issue? Patient was given these two medications in the hospital but wanted to know if Dr. Jens Som wants him to continue taking these medications. If so, he will need an RX sent to [id number] [place], [place] - [address].

## 2020-01-04 NOTE — Telephone Encounter (Addendum)
Called and spoke with pt, he states that he was started on clopidogrel and metoprolol tartrate when he was in the hospital and he wants to make sure he should continue with these medications. Notified pt that in his follow up visit with Edd Fabian NP it stated to continue these medications (clopidogrel and metoprolol tartrate)   "1. Status post CABG-CABG x6 on 11/03/2019 without complications (LIMA to LAD, left radial to PDA, Right IMA to first and second OM, SVG to apical LAD and first diagonal).  His echocardiogram 11/01/2019 showed an LVEF of 55%, mild LVH, intermediate diastolic parameters, tricuspid regurgitation, and trivial mitral valve regurgitation. Continue aspirin 81 mg tablet daily Continue Plavix 75 mg tablet daily Continue isosorbide dinitrate 10 mg tablet 3 times daily Continue lisinopril 5 mg tablet daily Continue metoprolol tartrate 50 mg twice daily Increase physical activity as tolerated maintain sternal precautions Heart healthy low-sodium diet"   Notified I would send a refill for both medications at this time and would double check with Dr.Crenshaw to see if any changes needed to be made. Pt verbalized understanding and had no other questions at this time.

## 2020-01-05 MED ORDER — METOPROLOL TARTRATE 50 MG PO TABS: 50.0000 mg | ORAL_TABLET | Freq: Two times a day (BID) | ORAL | 3 refills | Status: DC

## 2020-01-05 MED ORDER — CLOPIDOGREL BISULFATE 75 MG PO TABS: 75.0000 mg | ORAL_TABLET | Freq: Every day | ORAL | 3 refills | Status: DC

## 2020-01-05 NOTE — Telephone Encounter (Signed)
Spoke with pt, aware Refill sent to the pharmacy electronically.

## 2020-01-05 NOTE — Addendum Note (Signed)
Addended by: Freddi Starr on: 01/05/2020 10:45 AM   Modules accepted: Orders

## 2020-01-05 NOTE — Telephone Encounter (Signed)
Patient called and he was interested in participating in the Cardiac Rehab Program. Patient will come in for orientation on 01/21/2020@9 :00am and will attend the 9:15am exercise class.  Mailed homework package.

## 2020-01-05 NOTE — Telephone Encounter (Signed)
Continue plavix and metoprolol John May

## 2020-01-20 ENCOUNTER — Telehealth (HOSPITAL_COMMUNITY): Payer: Self-pay

## 2020-01-20 ENCOUNTER — Telehealth (HOSPITAL_COMMUNITY): Payer: Self-pay | Admitting: *Deleted

## 2020-01-20 ENCOUNTER — Telehealth (HOSPITAL_COMMUNITY): Payer: Self-pay | Admitting: Student-PharmD

## 2020-01-20 NOTE — Telephone Encounter (Signed)
Left message to call cardiac rehab regarding orientation appointment for tomorrow.Gladstone Lighter, RN,BSN 01/20/2020 2:04 PM

## 2020-01-20 NOTE — Telephone Encounter (Signed)
Cardiac Rehab Note:  Successful telephone encounter to John May to confirm Cardiac Rehab orientation appointment for 01/21/20 at 0900. Nursing assessment could not be completed at this time secondary to patient currently driving. He request call back later today to complete assessment.  Plan: Will follow up with patient as stated above  Cabria Micalizzi E. Suzie Portela RN, BSN [place]  Cardiac and Pulmonary Rehabilitation Phone: [phone] Fax: [phone]

## 2020-01-20 NOTE — Telephone Encounter (Signed)
Cardiac Rehab - Pharmacy Resident Documentation   Patient unable to be reached after three call attempts. Please complete allergy verification and medication review during patient's cardiac rehab appointment.     John May, PharmD [facility] PGY1 Pharmacy Resident [phone] 01/20/20      12:55 PM  Please check AMION for all [facility] Pharm[facility] phone numbers After 10:00 PM, call the Main Pharmacy [phone]

## 2020-01-21 ENCOUNTER — Telehealth (HOSPITAL_COMMUNITY): Payer: Self-pay

## 2020-01-21 ENCOUNTER — Other Ambulatory Visit: Payer: Self-pay

## 2020-01-21 ENCOUNTER — Encounter (HOSPITAL_COMMUNITY)
Admission: RE | Admit: 2020-01-21 | Discharge: 2020-01-21 | Disposition: A | Discharge status: Home / Self Care | Payer: Medicare Other | Source: Ambulatory Visit | Attending: Cardiology | Admitting: Cardiology

## 2020-01-21 ENCOUNTER — Encounter (HOSPITAL_COMMUNITY): Payer: Self-pay

## 2020-01-21 VITALS — BP 122/80 | HR 70 | Temp 98.1°F | Ht 66.0 in | Wt 197.1 lb

## 2020-01-21 DIAGNOSIS — Z951 Presence of aortocoronary bypass graft: Secondary | ICD-10-CM | POA: Diagnosis present

## 2020-01-21 DIAGNOSIS — I214 Non-ST elevation (NSTEMI) myocardial infarction: Secondary | ICD-10-CM | POA: Diagnosis present

## 2020-01-21 LAB — GLUCOSE, CAPILLARY: Glucose-Capillary: 111 mg/dL — ABNORMAL HIGH (ref 70–99)

## 2020-01-21 NOTE — Progress Notes (Signed)
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  aspirin EC 81 MG tablet, Take 81 mg by mouth daily., Disp: , Rfl:  .  clopidogrel (PLAVIX) 75 MG tablet, Take 1 tablet (75 mg total) by mouth daily., Disp: 90 tablet, Rfl: 3 .  ezetimibe (ZETIA) 10 MG tablet, TAKE 1 TABLET BY MOUTH EVERY DAY (Patient taking differently: Take 10 mg by mouth daily. ), Disp: 30 tablet, Rfl: 6 .  JARDIANCE 25 MG TABS tablet, Take 25 mg by mouth daily., Disp: , Rfl: 5 .  lisinopril (ZESTRIL) 5 MG tablet, Take 1 tablet (5 mg total) by mouth daily., Disp: 30 tablet, Rfl: 11 .  metFORMIN (GLUCOPHAGE) 1000 MG tablet, Take 1,000 mg by mouth 2 (two) times daily., Disp: , Rfl: 5 .  metoprolol tartrate (LOPRESSOR) 50 MG tablet, Take 1 tablet (50 mg total) by mouth 2 (two) times daily., Disp: 180 tablet, Rfl: 3 .  pioglitazone (ACTOS) 30 MG tablet, Take 30 mg by mouth daily., Disp: , Rfl: 5 .  rosuvastatin (CRESTOR) 40 MG tablet, TAKE 1 TABLET BY MOUTH EVERY DAY (Patient taking differently: Take 40 mg by mouth daily. ), Disp: 30 tablet, Rfl: 10 .  Vitamin D, Ergocalciferol, (DRISDOL) 50000 units CAPS capsule, Take 50,000 Units by mouth once a week. Sundays, Disp: , Rfl: 5  Past Medical History: Past Medical History:  Diagnosis Date  . CAD (coronary artery disease)    a. 01/2010 - MI with unsuccessful PCI of RCA. b. s/p PCI/DES to mid RCA 05/2012. c. Cath 11/2012 for abnl  stress test - diffuse branch and distal vessel CAD for med rx.    . Diabetes mellitus (HCC)    a. A1c 6.9 in 11/2012.  Marland Kitchen Hyperlipidemia   . Hypertension   . OSA on CPAP   . Paresthesias    a. bilat upper & lower extremities - intermittently present since 2011.    Tobacco Use: Social History   Tobacco Use  Smoking Status Former Smoker  . Packs/day: 0.50  . Years: 18.00  . Pack years: 9.00  . Types: Cigarettes  . Quit date: 11/26/2003  . Years since quitting: 16.1  Smokeless Tobacco Never Used    Labs: Recent Hydrographic surveyor    Labs for ITP Cardiac and Pulmonary Rehab Latest Ref Rng & Units 11/03/2019 11/03/2019 11/03/2019 11/03/2019 11/03/2019   Cholestrol 0 - 200 mg/dL - - - - -   LDLCALC 0 - 99 mg/dL - - - - -   HDL >40 mg/dL - - - - -   Trlycerides <150 mg/dL - - - - -   Hemoglobin A1c 4.8 - 5.6 % - - - - -   PHART 7.350 - 7.450 - - 7.439 7.367 7.399   PCO2ART 32.0 - 48.0 mmHg - - 30.8(L) 40.7 37.4   HCO3 20.0 - 28.0 mmol/L - - 21.2 23.6 23.2   TCO2 22 - 32 mmol/L  29 26 22 25 24    ACIDBASEDEF 0.0 - 2.0 mmol/L - - 3.0(H) 2.0 1.0   O2SAT % - - 97.0 99.0 99.0      Capillary Blood Glucose: Lab Results  Component Value Date   GLUCAP 111 (H) 01/21/2020   GLUCAP 98 11/07/2019   GLUCAP 137 (H) 11/06/2019   GLUCAP 97 11/06/2019   GLUCAP 113 (H) 11/06/2019     Exercise Target Goals: Exercise Program Goal: Individual exercise prescription set using results from initial 6 min walk test and THRR while considering  patient's activity barriers and safety.   Exercise Prescription Goal: Starting with aerobic activity 30 plus minutes a day, 3 days per week for initial exercise prescription. Provide home exercise prescription and guidelines that participant acknowledges understanding prior to discharge.  Activity Barriers & Risk Stratification: Activity Barriers & Cardiac Risk Stratification - 01/21/20 1127      Activity Barriers & Cardiac Risk Stratification   Activity  Barriers  Incisional Pain;Deconditioning    Cardiac Risk Stratification  High       6 Minute Walk: 6 Minute Walk    Row Name 01/21/20 1123         6 Minute Walk   Phase  Initial     Distance  1200 feet     Walk Time  6 minutes     # of Rest Breaks  0     MPH  2.3     METS  3.6     RPE  12     Perceived Dyspnea   1     VO2 Peak  12.4     Symptoms  Yes (comment)     Comments  Mild SOB +1, Leg Fatigue     Resting HR  70 bpm     Resting BP  122/80     Resting Oxygen Saturation   99 %     Exercise Oxygen Saturation  during 6 min walk  98 %     Max Ex. HR  91 bpm     Max Ex. BP  150/80     2 Minute Post BP  124/78        Oxygen Initial Assessment:   Oxygen Re-Evaluation:   Oxygen Discharge (Final Oxygen Re-Evaluation):   Initial Exercise Prescription: Initial Exercise Prescription - 01/21/20 1100      Date of Initial Exercise RX and Referring Provider   Date  01/21/20    Referring Provider  01/23/20 MD    Expected Discharge Date  02/19/20      Recumbant Bike   Level  2    Watts  25    Minutes  15    METs  2.8      NuStep   Level  2    SPM  80    Minutes  15    METs  2.5      Prescription Details   Frequency (times per week)  3x    Duration  Progress to 30 minutes of continuous aerobic without signs/symptoms of physical distress      Intensity   THRR 40-80% of Max Heartrate  68-135    Ratings of Perceived Exertion  11-13    Perceived Dyspnea  0-4      Progression   Progression  Continue progressive overload as per policy without signs/symptoms or physical distress.      Resistance Training   Training Prescription  Yes    Weight  3lbs  Reps  10-15       Perform Capillary Blood Glucose checks as needed.  Exercise Prescription Changes:   Exercise Comments:   Exercise Goals and Review: Exercise Goals    Row Name 01/21/20 0938             Exercise Goals   Increase Physical Activity  Yes       Intervention  Provide  advice, education, support and counseling about physical activity/exercise needs.;Develop an individualized exercise prescription for aerobic and resistive training based on initial evaluation findings, risk stratification, comorbidities and participant's personal goals.       Expected Outcomes  Short Term: Attend rehab on a regular basis to increase amount of physical activity.;Long Term: Add in home exercise to make exercise part of routine and to increase amount of physical activity.;Long Term: Exercising regularly at least 3-5 days a week.       Increase Strength and Stamina  Yes       Intervention  Provide advice, education, support and counseling about physical activity/exercise needs.;Develop an individualized exercise prescription for aerobic and resistive training based on initial evaluation findings, risk stratification, comorbidities and participant's personal goals.       Expected Outcomes  Short Term: Perform resistance training exercises routinely during rehab and add in resistance training at home;Short Term: Increase workloads from initial exercise prescription for resistance, speed, and METs.;Long Term: Improve cardiorespiratory fitness, muscular endurance and strength as measured by increased METs and functional capacity ( )       Able to understand and use rate of perceived exertion (RPE) scale  Yes       Intervention  Provide education and explanation on how to use RPE scale       Expected Outcomes  Short Term: Able to use RPE daily in rehab to express subjective intensity level;Long Term:  Able to use RPE to guide intensity level when exercising independently       Knowledge and understanding of Target Heart Rate Range (THRR)  Yes       Intervention  Provide education and explanation of THRR including how the numbers were predicted and where they are located for reference       Expected Outcomes  Short Term: Able to state/look up THRR;Long Term: Able to use THRR to govern intensity  when exercising independently;Short Term: Able to use daily as guideline for intensity in rehab       Able to check pulse independently  Yes       Intervention  Provide education and demonstration on how to check pulse in carotid and radial arteries.;Review the importance of being able to check your own pulse for safety during independent exercise       Expected Outcomes  Short Term: Able to explain why pulse checking is important during independent exercise;Long Term: Able to check pulse independently and accurately       Understanding of Exercise Prescription  Yes       Intervention  Provide education, explanation, and written materials on patient's individual exercise prescription       Expected Outcomes  Short Term: Able to explain program exercise prescription;Long Term: Able to explain home exercise prescription to exercise independently          Exercise Goals Re-Evaluation :    Discharge Exercise Prescription (Final Exercise Prescription Changes):   Nutrition:  Target Goals: Understanding of nutrition guidelines, daily intake of sodium 1500mg , cholesterol 200mg , calories 30% from fat and 7% or less from saturated fats,  daily to have 5 or more servings of fruits and vegetables.  Biometrics: Pre Biometrics - 01/21/20 1129      Pre Biometrics   Height  5\' 6"  (1.676 m)    Weight  89.4 kg    Waist Circumference  44.5 inches    Hip Circumference  44 inches    Waist to Hip Ratio  1.01 %    BMI (Calculated)  31.83    Triceps Skinfold  13 mm    % Body Fat  30.3 %    Grip Strength  35 kg    Flexibility  13 in    Single Leg Stand  19.28 seconds        Nutrition Therapy Plan and Nutrition Goals:   Nutrition Assessments:   Nutrition Goals Re-Evaluation:   Nutrition Goals Discharge (Final Nutrition Goals Re-Evaluation):   Psychosocial: Target Goals: Acknowledge presence or absence of significant depression and/or stress, maximize coping skills, provide positive support  system. Participant is able to verbalize types and ability to use techniques and skills needed for reducing stress and depression.  Initial Review & Psychosocial Screening: Initial Psych Review & Screening - 01/21/20 1012      Initial Review   Current issues with  Current Stress Concerns    Source of Stress Concerns  Family    Comments  John May is a single parent who care for his autistic son      Family Dynamics   Good Support System?  Yes   John May lives with his autistic son and has his mother and daughter for support although they live out of town     Barriers   Psychosocial barriers to participate in program  The patient should benefit from training in stress management and relaxation.      Screening Interventions   Interventions  Encouraged to exercise       Quality of Life Scores: Quality of Life - 01/21/20 1116      Quality of Life   Select  Quality of Life      Quality of Life Scores   Health/Function Pre  27.2 %    Socioeconomic Pre  29 %    Psych/Spiritual Pre  29.14 %    Family Pre  25.5 %    GLOBAL Pre  27.75 %      Scores of 19 and below usually indicate a poorer quality of life in these areas.  A difference of  2-3 points is a clinically meaningful difference.  A difference of 2-3 points in the total score of the Quality of Life Index has been associated with significant improvement in overall quality of life, self-image, physical symptoms, and general health in studies assessing change in quality of life.  PHQ-9: Recent Review Flowsheet Data    Depression screen [facility] 2/9 01/21/2020 01/12/2014   Decreased Interest 0 1   Down, Depressed, Hopeless 0 1   PHQ - 2 Score 0 2   Altered sleeping - 2   Tired, decreased energy - 1   Change in appetite - 0   Feeling bad or failure about yourself  - 0   Trouble concentrating - 1   Moving slowly or fidgety/restless - 0   Suicidal thoughts - 0   PHQ-9 Score - 6     Interpretation of Total Score  Total Score Depression  Severity:  1-4 = Minimal depression, 5-9 = Mild depression, 10-14 = Moderate depression, 15-19 = Moderately severe depression, 20-27 = Severe depression   Psychosocial  Evaluation and Intervention:   Psychosocial Re-Evaluation:   Psychosocial Discharge (Final Psychosocial Re-Evaluation):   Vocational Rehabilitation: Provide vocational rehab assistance to qualifying candidates.   Vocational Rehab Evaluation & Intervention: Vocational Rehab - 01/21/20 1308      Initial Vocational Rehab Evaluation & Intervention   Assessment shows need for Vocational Rehabilitation  No   John May is disabled and does not need vocational rehab at this time      Education: Education Goals: Education classes will be provided on a weekly basis, covering required topics. Participant will state understanding/return demonstration of topics presented.  Learning Barriers/Preferences: Learning Barriers/Preferences - 01/21/20 1130      Learning Barriers/Preferences   Learning Barriers  None    Learning Preferences  Verbal Instruction;Skilled Demonstration;Individual Instruction       Education Topics: Hypertension, Hypertension Reduction -Define heart disease and high blood pressure. Discus how high blood pressure affects the body and ways to reduce high blood pressure.   Exercise and Your Heart -Discuss why it is important to exercise, the FITT principles of exercise, normal and abnormal responses to exercise, and how to exercise safely.   Angina -Discuss definition of angina, causes of angina, treatment of angina, and how to decrease risk of having angina.   Cardiac Medications -Review what the following cardiac medications are used for, how they affect the body, and side effects that may occur when taking the medications.  Medications include Aspirin, Beta blockers, calcium channel blockers, ACE Inhibitors, angiotensin receptor blockers, diuretics, digoxin, and antihyperlipidemics.   Congestive  Heart Failure -Discuss the definition of CHF, how to live with CHF, the signs and symptoms of CHF, and how keep track of weight and sodium intake.   Heart Disease and Intimacy -Discus the effect sexual activity has on the heart, how changes occur during intimacy as we age, and safety during sexual activity.   Smoking Cessation / COPD -Discuss different methods to quit smoking, the health benefits of quitting smoking, and the definition of COPD.   Nutrition I: Fats -Discuss the types of cholesterol, what cholesterol does to the heart, and how cholesterol levels can be controlled.   Nutrition II: Labels -Discuss the different components of food labels and how to read food label   Heart Parts/Heart Disease and PAD -Discuss the anatomy of the heart, the pathway of blood circulation through the heart, and these are affected by heart disease.   Stress I: Signs and Symptoms -Discuss the causes of stress, how stress may lead to anxiety and depression, and ways to limit stress.   Stress II: Relaxation -Discuss different types of relaxation techniques to limit stress.   Warning Signs of Stroke / TIA -Discuss definition of a stroke, what the signs and symptoms are of a stroke, and how to identify when someone is having stroke.   Knowledge Questionnaire Score: Knowledge Questionnaire Score - 01/21/20 1115      Knowledge Questionnaire Score   Pre Score  18/24       Core Components/Risk Factors/Patient Goals at Admission: Personal Goals and Risk Factors at Admission - 01/21/20 1309      Core Components/Risk Factors/Patient Goals on Admission    Weight Management  Yes;Obesity;Weight Loss    Intervention  Weight Management: Develop a combined nutrition and exercise program designed to reach desired caloric intake, while maintaining appropriate intake of nutrient and fiber, sodium and fats, and appropriate energy expenditure required for the weight goal.;Weight Management: Provide  education and appropriate resources to help participant work on and  attain dietary goals.;Weight Management/Obesity: Establish reasonable short term and long term weight goals.    Diabetes  Yes    Intervention  Provide education about signs/symptoms and action to take for hypo/hyperglycemia.;Provide education about proper nutrition, including hydration, and aerobic/resistive exercise prescription along with prescribed medications to achieve blood glucose in normal ranges: Fasting glucose 65-99 mg/dL    Expected Outcomes  Short Term: Participant verbalizes understanding of the signs/symptoms and immediate care of hyper/hypoglycemia, proper foot care and importance of medication, aerobic/resistive exercise and nutrition plan for blood glucose control.;Long Term: Attainment of HbA1C < 7%.    Hypertension  Yes    Intervention  Monitor prescription use compliance.;Provide education on lifestyle modifcations including regular physical activity/exercise, weight management, moderate sodium restriction and increased consumption of fresh fruit, vegetables, and low fat dairy, alcohol moderation, and smoking cessation.    Expected Outcomes  Long Term: Maintenance of blood pressure at goal levels.;Short Term: Continued assessment and intervention until BP is < 140/73mm HG in hypertensive participants. < 130/25mm HG in hypertensive participants with diabetes, heart failure or chronic kidney disease.    Lipids  Yes    Intervention  Provide education and support for participant on nutrition & aerobic/resistive exercise along with prescribed medications to achieve LDL 70mg , HDL >40mg .    Expected Outcomes  Long Term: Cholesterol controlled with medications as prescribed, with individualized exercise RX and with personalized nutrition plan. Value goals: LDL < 70mg , HDL > 40 mg.;Short Term: Participant states understanding of desired cholesterol values and is compliant with medications prescribed. Participant is following  exercise prescription and nutrition guidelines.    Stress  Yes    Intervention  Offer individual and/or small group education and counseling on adjustment to heart disease, stress management and health-related lifestyle change. Teach and support self-help strategies.;Refer participants experiencing significant psychosocial distress to appropriate mental health specialists for further evaluation and treatment. When possible, include family members and significant others in education/counseling sessions.    Expected Outcomes  Short Term: Participant demonstrates changes in health-related behavior, relaxation and other stress management skills, ability to obtain effective social support, and compliance with psychotropic medications if prescribed.;Long Term: Emotional wellbeing is indicated by absence of clinically significant psychosocial distress or social isolation.       Core Components/Risk Factors/Patient Goals Review:    Core Components/Risk Factors/Patient Goals at Discharge (Final Review):    ITP Comments: ITP Comments    Row Name 01/21/20 0933           ITP Comments  Dr. Fransico Him, Medical Director          Comments: Patient attended orientation on 01/21/2020 to review rules and guidelines for program.  Completed 6 minute walk test, Intitial ITP, and exercise prescription.  VSS. Telemetry-Sinus Rhythm with occasional PVC's this has been previously doucmented.  Asymptomatic. Safety measures and social distancing in place per CDC guidelines. John May plans only to attend 4 exercise sessions due to cost. Will ask the patient about participating in virtual cardiac rehab when he begins exercise on 02/01/20.Barnet Pall, RN,BSN 01/21/2020 1:19 PM

## 2020-01-21 NOTE — Telephone Encounter (Signed)
Pt came in for cardiac rehab orientation and had questions about his insurance in regards to cardiac rehab. I called his [facility] insurance company and spoke with KIM (ref# [id number])  and nothing changed advised pt that he would still have a $25 copay per session as advised in the beginning. Pt stated that he can only afford to do 2 sessions per week for only 2 weeks. Staci Righter. That we ask for at least 4 weeks for the pt to complete. Asked our nurse navigator about the situation and will get back to [facility] of what to advise the pt.

## 2020-01-21 NOTE — Progress Notes (Signed)
Cardiac Rehab Medication Review by a Nurse  Does the patient  feel that his/her medications are working for him/her?  yes  Has the patient been experiencing any side effects to the medications prescribed?  no  Does the patient measure his/her own blood pressure or blood glucose at home?  yes   Does the patient have any problems obtaining medications due to transportation or finances?   no  Understanding of regimen: good Understanding of indications: good Potential of compliance: good    Nurse comments: Hatcher is taking his medications as prescribed and is compliant with his medications. Joeangel has a blood pressure and CBG monitor at home and checks his CBG's every day.    Thayer Headings RN BSN 01/21/2020 10:06 AM

## 2020-02-01 ENCOUNTER — Telehealth: Payer: Self-pay | Admitting: *Deleted

## 2020-02-01 ENCOUNTER — Encounter (HOSPITAL_COMMUNITY)
Admission: RE | Admit: 2020-02-01 | Discharge: 2020-02-01 | Disposition: A | Discharge status: Home / Self Care | Payer: Medicare Other | Source: Ambulatory Visit | Attending: Cardiology | Admitting: Cardiology

## 2020-02-01 ENCOUNTER — Other Ambulatory Visit: Payer: Self-pay

## 2020-02-01 DIAGNOSIS — I10 Essential (primary) hypertension: Secondary | ICD-10-CM

## 2020-02-01 DIAGNOSIS — I214 Non-ST elevation (NSTEMI) myocardial infarction: Secondary | ICD-10-CM

## 2020-02-01 DIAGNOSIS — Z951 Presence of aortocoronary bypass graft: Secondary | ICD-10-CM

## 2020-02-01 LAB — GLUCOSE, CAPILLARY
Glucose-Capillary: 143 mg/dL — ABNORMAL HIGH (ref 70–99)
Glucose-Capillary: 110 mg/dL — ABNORMAL HIGH (ref 70–99)

## 2020-02-01 MED ORDER — LISINOPRIL 20 MG PO TABS: 20.0000 mg | ORAL_TABLET | Freq: Every day | ORAL | 11 refills | Status: DC

## 2020-02-01 NOTE — Telephone Encounter (Signed)
Spoke with pt, Aware of dr crenshaw's recommendations.  °

## 2020-02-01 NOTE — Progress Notes (Addendum)
Daily Session Note  Patient Details  Name: John May MRN: [id number] Date of Birth: 19-Dec-1965 Referring Provider:     CARDIAC REHAB PHASE II ORIENTATION from 01/21/2020 in [place]  Referring Provider  John Perla MD       Encounter Date: 02/01/2020  Check In: Session Check In - 02/01/20 5621       Check-In   Supervising physician immediately available to respond to emergencies  [facility] immediately available    Physician(s)  Dr. Tyrell May    Location  [facility]    Staff Present  John Pall, RN, John Hart, RN, John Loser, MS, CEP, Exercise Physiologist;John Celesta Aver, MS, ACSM CEP, Exercise Physiologist    Virtual Visit  No    Medication changes reported      No    Fall or balance concerns reported     No    Tobacco Cessation  No Change    Warm-up and Cool-down  Performed on first and last piece of equipment    Resistance Training Performed  No    VAD Patient?  No    PAD/SET Patient?  No      Pain Assessment   Currently in Pain?  No/denies    Pain Score  0-No pain    Multiple Pain Sites  No        Capillary Blood Glucose: Results for orders placed or performed during the hospital encounter of 02/01/20 (from the past 24 hour(s))  Glucose, capillary     Status: Abnormal   Collection Time: 02/01/20  9:12 AM  Result Value Ref Range   Glucose-Capillary 143 (H) 70 - 99 mg/dL  Glucose, capillary     Status: Abnormal   Collection Time: 02/01/20  9:54 AM  Result Value Ref Range   Glucose-Capillary 110 (H) 70 - 99 mg/dL     Exercise Prescription Changes - 02/01/20 0925       Response to Exercise   Blood Pressure (Admit)  152/90    Blood Pressure (Exercise)  160/90    Blood Pressure (Exit)  142/88    Heart Rate (Admit)  78 bpm    Heart Rate (Exercise)  112 bpm    Heart Rate (Exit)  73 bpm    Rating of Perceived Exertion (Exercise)  11    Symptoms  none    Comments  Blood  pressure elevated before, during, and after exercise: asymptomatic.    Duration  Progress to 30 minutes of  aerobic without signs/symptoms of physical distress    Intensity  THRR unchanged      Progression   Progression  Continue to progress workloads to maintain intensity without signs/symptoms of physical distress.    Average METs  3.1      Resistance Training   Training Prescription  No      Interval Training   Interval Training  No      NuStep   Level  1    SPM  80    Minutes  25    METs  3.1        Social History   Tobacco Use  Smoking Status Former Smoker  . Packs/day: 0.50  . Years: 18.00  . Pack years: 9.00  . Types: Cigarettes  . Quit date: 11/26/2003  . Years since quitting: 16.1  Smokeless Tobacco Never Used    Goals Met:  No report of cardiac concerns or symptoms  Goals Unmet:  BP  Comments:  Pt started cardiac rehab today.  Pt tolerated light exercise without difficulty. [name]'s resting and exertional blood pressures were elevated today. Blood pressures were as follows . Entry blood pressure 152/90 heart rate 78. Blood pressure noted at 158/94 then 160/90 on the nustep. Max heart rate 112. Recheck blood pressure 168/88 post nutep. No weights used today  Exit blood pressure 142/88.Marland Kitchen telemetry-Sinus Rhythm, asymptomatic.  Medication list reconciled. Pt denies barriers to medicaiton compliance.  PSYCHOSOCIAL ASSESSMENT:  PHQ-0. Pt exhibits positive coping skills, hopeful outlook with supportive family. No psychosocial needs identified at this time, no psychosocial interventions necessary.     Pt oriented to exercise equipment and routine.    Understanding verbalized. Will forward today's blood pressures to Dr John May and his nurse John Beets RN for review. John May said the he did not sleep well as he was up with his autistic son. John May said he drank Decaf coffee this morning. Will continue to monitor BP's.John Pall, RN,BSN 02/01/2020 10:37 AM    John May  is Medical Director for Cardiac Rehab at [facility].

## 2020-02-01 NOTE — Telephone Encounter (Signed)
-----   Message from Lewayne Bunting, MD sent at 02/01/2020 12:44 PM EDT ----- Regarding: FW: elevated BP's at cardiac rehab Increase lisinopril to 20 mg daily and follow blood pressure.  Bmet 1 week later. Olga Millers ----- Message ----- From: Cammy Copa, RN Sent: 02/01/2020  10:39 AM EDT To: Freddi Starr, RN, Lewayne Bunting, MD Subject: elevated BP's at cardiac rehab                 Good morning Dr Jens Som,  Mr Schools started exercise today his blood pressures were noted at Entry blood pressure 152/90 heart rate 78. Blood pressure noted at 158/94 then 160/90 on the nustep. Max heart rate 112. Recheck blood pressure 168/88 post nutep. No weights used today . telemetry-Sinus Rhythm. Exit blood pressure 142/88.  Vada is taking 50 mg of metoprolol twice a day and 5 mg of lisinopril once a day. Altair says that he is taking his medications as prescribed.   His blood pressures at orientation were within normal limits resting at orientation on 01/21/20.   Please advise,  Thorvald plans to participate for 2 weeks then plans to exercise at home he has equipment at home as he cares for his autistic son.  Keeton will follow up with you on 03/01/20.  Sincerely,  Gladstone Lighter RN Cardiac rehab

## 2020-02-03 ENCOUNTER — Other Ambulatory Visit: Payer: Self-pay

## 2020-02-03 ENCOUNTER — Encounter (HOSPITAL_COMMUNITY)
Admission: RE | Admit: 2020-02-03 | Discharge: 2020-02-03 | Disposition: A | Discharge status: Home / Self Care | Payer: Medicare Other | Source: Ambulatory Visit | Attending: Cardiology | Admitting: Cardiology

## 2020-02-03 DIAGNOSIS — I214 Non-ST elevation (NSTEMI) myocardial infarction: Secondary | ICD-10-CM

## 2020-02-03 DIAGNOSIS — Z951 Presence of aortocoronary bypass graft: Secondary | ICD-10-CM

## 2020-02-03 LAB — GLUCOSE, CAPILLARY: Glucose-Capillary: 125 mg/dL — ABNORMAL HIGH (ref 70–99)

## 2020-02-05 ENCOUNTER — Encounter (HOSPITAL_COMMUNITY): Payer: Medicare Other

## 2020-02-10 ENCOUNTER — Encounter (HOSPITAL_COMMUNITY): Payer: Medicare Other

## 2020-02-11 ENCOUNTER — Encounter (HOSPITAL_COMMUNITY): Payer: Self-pay | Admitting: *Deleted

## 2020-02-11 DIAGNOSIS — Z951 Presence of aortocoronary bypass graft: Secondary | ICD-10-CM

## 2020-02-11 DIAGNOSIS — I214 Non-ST elevation (NSTEMI) myocardial infarction: Secondary | ICD-10-CM

## 2020-02-11 NOTE — Progress Notes (Signed)
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  aspirin EC 81 MG tablet, Take 81 mg by mouth daily., Disp: , Rfl:  .  clopidogrel (PLAVIX) 75 MG tablet, Take 1 tablet (75 mg total) by mouth daily., Disp: 90 tablet, Rfl: 3 .  ezetimibe (ZETIA) 10 MG tablet, TAKE 1 TABLET BY MOUTH EVERY DAY (Patient taking differently: Take 10 mg by mouth daily. ), Disp: 30 tablet, Rfl: 6 .  JARDIANCE 25 MG TABS tablet, Take 25 mg by mouth daily., Disp: , Rfl: 5 .  lisinopril (ZESTRIL) 20 MG tablet, Take 1 tablet (20 mg total) by mouth daily., Disp: 30 tablet, Rfl: 11 .  metFORMIN (GLUCOPHAGE) 1000 MG tablet, Take 1,000 mg by mouth 2 (two) times daily., Disp: , Rfl: 5 .  metoprolol tartrate (LOPRESSOR) 50 MG tablet, Take 1 tablet (50 mg total) by mouth 2 (two) times daily., Disp: 180 tablet, Rfl: 3 .  pioglitazone (ACTOS) 30 MG tablet, Take 30 mg by mouth daily., Disp: , Rfl: 5 .  rosuvastatin (CRESTOR) 40 MG tablet, TAKE 1 TABLET BY MOUTH EVERY DAY (Patient taking differently: Take 40 mg by mouth daily. ), Disp: 30 tablet, Rfl: 10 .  Vitamin D, Ergocalciferol, (DRISDOL) 50000 units CAPS capsule, Take 50,000 Units by mouth once a week. Sundays, Disp: , Rfl: 5  Past Medical History: Past Medical History:  Diagnosis Date  . CAD (coronary artery disease)    a. 01/2010 - MI with unsuccessful PCI of RCA. b. s/p PCI/DES to mid RCA 05/2012. c. Cath 11/2012 for  abnl stress test - diffuse branch and distal vessel CAD for med rx.    . Diabetes mellitus ([place])    a. A1c 6.9 in 11/2012.  Marland Kitchen Hyperlipidemia   . Hypertension   . OSA on CPAP   . Paresthesias    a. bilat upper & lower extremities - intermittently present since 2011.    Tobacco Use: Social History   Tobacco Use  Smoking Status Former Smoker  . Packs/day: 0.50  . Years: 18.00  . Pack years: 9.00  . Types: Cigarettes  . Quit date: 11/26/2003  . Years since quitting: 16.2  Smokeless Tobacco Never Used    Labs: Recent Chemical engineer    Labs for ITP Cardiac and Pulmonary Rehab Latest Ref Rng & Units 11/03/2019 11/03/2019 11/03/2019 11/03/2019 11/03/2019   Cholestrol 0 - 200 mg/dL - - - - -   LDLCALC 0 - 99 mg/dL - - - - -   HDL >40 mg/dL - - - - -   Trlycerides <150 mg/dL - - - - -   Hemoglobin A1c 4.8 - 5.6 % - - - - -   PHART 7.350 - 7.450 - - 7.439 7.367 7.399   PCO2ART 32.0 - 48.0 mmHg - - 30.8(L) 40.7 37.4   HCO3 20.0 - 28.0 mmol/L - - 21.2 23.6 23.2   TCO2 22 - 32 mmol/L  ACIDBASEDEF 0.0 - 2.0 mmol/L - - 3.0(H) 2.0 1.0   O2SAT % - - 97.0 99.0 99.0      Capillary Blood Glucose: Lab Results  Component Value Date   GLUCAP 125 (H) 02/03/2020   GLUCAP 110 (H) 02/01/2020   GLUCAP 143 (H) 02/01/2020   GLUCAP 111 (H) 01/21/2020   GLUCAP 98 11/07/2019     Exercise Target Goals: Exercise Program Goal: Individual exercise prescription set using results from initial 6 min walk test and THRR while considering  patient's activity barriers and safety.   Exercise Prescription Goal: Starting with aerobic activity 30 plus minutes a day, 3 days per week for initial exercise prescription. Provide home exercise prescription and guidelines that participant acknowledges understanding prior to discharge.  Activity Barriers & Risk Stratification: Activity Barriers & Cardiac Risk Stratification - 01/21/20 1127      Activity Barriers & Cardiac Risk Stratification    Activity Barriers  Incisional Pain;Deconditioning    Cardiac Risk Stratification  High       6 Minute Walk: 6 Minute Walk    Row Name 01/21/20 1123         6 Minute Walk   Phase  Initial     Distance  1200 feet     Walk Time  6 minutes     # of Rest Breaks  0     MPH  2.3     METS  3.6     RPE  12     Perceived Dyspnea   1     VO2 Peak  12.4     Symptoms  Yes (comment)     Comments  Mild SOB +1, Leg Fatigue     Resting HR  70 bpm     Resting BP  122/80     Resting Oxygen Saturation   99 %     Exercise Oxygen Saturation  during 6 min walk  98 %     Max Ex. HR  91 bpm     Max Ex. BP  150/80     2 Minute Post BP  124/78        Oxygen Initial Assessment:   Oxygen Re-Evaluation:   Oxygen Discharge (Final Oxygen Re-Evaluation):   Initial Exercise Prescription: Initial Exercise Prescription - 01/21/20 1100      Date of Initial Exercise RX and Referring Provider   Date  01/21/20    Referring Provider  Lewayne Bunting MD    Expected Discharge Date  02/19/20      Recumbant Bike   Level  2    Watts  25    Minutes  15    METs  2.8      NuStep   Level  2    SPM  80    Minutes  15    METs  2.5      Prescription Details   Frequency (times per week)  3x    Duration  Progress to 30 minutes of continuous aerobic without signs/symptoms of physical distress      Intensity   THRR 40-80% of Max Heartrate  68-135    Ratings of Perceived Exertion  11-13    Perceived Dyspnea  0-4      Progression   Progression  Continue progressive overload as per policy without signs/symptoms or physical distress.      Resistance Training   Training Prescription  Yes    Weight  3lbs  Reps  10-15       Perform Capillary Blood Glucose checks as needed.  Exercise Prescription Changes: Exercise Prescription Changes    Row Name 02/01/20 0925             Response to Exercise   Blood Pressure (Admit)  152/90       Blood Pressure (Exercise)  160/90       Blood  Pressure (Exit)  142/88       Heart Rate (Admit)  78 bpm       Heart Rate (Exercise)  112 bpm       Heart Rate (Exit)  73 bpm       Rating of Perceived Exertion (Exercise)  11       Symptoms  none       Comments  Blood pressure elevated before, during, and after exercise: asymptomatic.       Duration  Progress to 30 minutes of  aerobic without signs/symptoms of physical distress       Intensity  THRR unchanged         Progression   Progression  Continue to progress workloads to maintain intensity without signs/symptoms of physical distress.       Average METs  3.1         Resistance Training   Training Prescription  No         Interval Training   Interval Training  No         NuStep   Level  1       SPM  80       Minutes  25       METs  3.1          Exercise Comments: Exercise Comments    Row Name 02/01/20 1000           Exercise Comments  Patient's blood pressure was elevated at rest and with exercise. Patient remained on the recumbent stepper for 25 minutes, no weights today.          Exercise Goals and Review: Exercise Goals    Row Name 01/21/20 0938             Exercise Goals   Increase Physical Activity  Yes       Intervention  Provide advice, education, support and counseling about physical activity/exercise needs.;Develop an individualized exercise prescription for aerobic and resistive training based on initial evaluation findings, risk stratification, comorbidities and participant's personal goals.       Expected Outcomes  Short Term: Attend rehab on a regular basis to increase amount of physical activity.;Long Term: Add in home exercise to make exercise part of routine and to increase amount of physical activity.;Long Term: Exercising regularly at least 3-5 days a week.       Increase Strength and Stamina  Yes       Intervention  Provide advice, education, support and counseling about physical activity/exercise needs.;Develop an individualized exercise  prescription for aerobic and resistive training based on initial evaluation findings, risk stratification, comorbidities and participant's personal goals.       Expected Outcomes  Short Term: Perform resistance training exercises routinely during rehab and add in resistance training at home;Short Term: Increase workloads from initial exercise prescription for resistance, speed, and METs.;Long Term: Improve cardiorespiratory fitness, muscular endurance and strength as measured by increased METs and functional capacity ( )       Able to understand and use rate of perceived exertion (RPE) scale  Yes  Intervention  Provide education and explanation on how to use RPE scale       Expected Outcomes  Short Term: Able to use RPE daily in rehab to express subjective intensity level;Long Term:  Able to use RPE to guide intensity level when exercising independently       Knowledge and understanding of Target Heart Rate Range (THRR)  Yes       Intervention  Provide education and explanation of THRR including how the numbers were predicted and where they are located for reference       Expected Outcomes  Short Term: Able to state/look up THRR;Long Term: Able to use THRR to govern intensity when exercising independently;Short Term: Able to use daily as guideline for intensity in rehab       Able to check pulse independently  Yes       Intervention  Provide education and demonstration on how to check pulse in carotid and radial arteries.;Review the importance of being able to check your own pulse for safety during independent exercise       Expected Outcomes  Short Term: Able to explain why pulse checking is important during independent exercise;Long Term: Able to check pulse independently and accurately       Understanding of Exercise Prescription  Yes       Intervention  Provide education, explanation, and written materials on patient's individual exercise prescription       Expected Outcomes  Short Term:  Able to explain program exercise prescription;Long Term: Able to explain home exercise prescription to exercise independently          Exercise Goals Re-Evaluation : Exercise Goals Re-Evaluation    Row Name 02/01/20 1000             Exercise Goal Re-Evaluation   Exercise Goals Review  Increase Physical Activity;Able to understand and use rate of perceived exertion (RPE) scale       Comments  Patient able to understand and use RPE scale appropriately.       Expected Outcomes  Increase workloads as tolerated to help improve cardiorespiratory fitness.           Discharge Exercise Prescription (Final Exercise Prescription Changes): Exercise Prescription Changes - 02/01/20 0925      Response to Exercise   Blood Pressure (Admit)  152/90    Blood Pressure (Exercise)  160/90    Blood Pressure (Exit)  142/88    Heart Rate (Admit)  78 bpm    Heart Rate (Exercise)  112 bpm    Heart Rate (Exit)  73 bpm    Rating of Perceived Exertion (Exercise)  11    Symptoms  none    Comments  Blood pressure elevated before, during, and after exercise: asymptomatic.    Duration  Progress to 30 minutes of  aerobic without signs/symptoms of physical distress    Intensity  THRR unchanged      Progression   Progression  Continue to progress workloads to maintain intensity without signs/symptoms of physical distress.    Average METs  3.1      Resistance Training   Training Prescription  No      Interval Training   Interval Training  No      NuStep   Level  1    SPM  80    Minutes  25    METs  3.1       Nutrition:  Target Goals: Understanding of nutrition guidelines, daily intake of sodium 1500mg , cholesterol <  200mg , calories 30% from fat and 7% or less from saturated fats, daily to have 5 or more servings of fruits and vegetables.  Biometrics: Pre Biometrics - 01/21/20 1129      Pre Biometrics   Height  5\' 6"  (1.676 m)    Weight  197 lb 1.5 oz (89.4 kg)    Waist Circumference  44.5  inches    Hip Circumference  44 inches    Waist to Hip Ratio  1.01 %    BMI (Calculated)  31.83    Triceps Skinfold  13 mm    % Body Fat  30.3 %    Grip Strength  35 kg    Flexibility  13 in    Single Leg Stand  19.28 seconds        Nutrition Therapy Plan and Nutrition Goals:   Nutrition Assessments: Nutrition Assessments - 01/29/20 1026      MEDFICTS Scores   Pre Score  12       Nutrition Goals Re-Evaluation:   Nutrition Goals Discharge (Final Nutrition Goals Re-Evaluation):   Psychosocial: Target Goals: Acknowledge presence or absence of significant depression and/or stress, maximize coping skills, provide positive support system. Participant is able to verbalize types and ability to use techniques and skills needed for reducing stress and depression.  Initial Review & Psychosocial Screening: Initial Psych Review & Screening - 01/21/20 1012      Initial Review   Current issues with  Current Stress Concerns    Source of Stress Concerns  Family    Comments  Gerlad is a single parent who care for his autistic son      Family Dynamics   Good Support System?  Yes   Amarion lives with his autistic son and has his mother and daughter for support although they live out of town     Barriers   Psychosocial barriers to participate in program  The patient should benefit from training in stress management and relaxation.      Screening Interventions   Interventions  Encouraged to exercise       Quality of Life Scores: Quality of Life - 01/21/20 1116      Quality of Life   Select  Quality of Life      Quality of Life Scores   Health/Function Pre  27.2 %    Socioeconomic Pre  29 %    Psych/Spiritual Pre  29.14 %    Family Pre  25.5 %    GLOBAL Pre  27.75 %      Scores of 19 and below usually indicate a poorer quality of life in these areas.  A difference of  2-3 points is a clinically meaningful difference.  A difference of 2-3 points in the total score of the Quality  of Life Index has been associated with significant improvement in overall quality of life, self-image, physical symptoms, and general health in studies assessing change in quality of life.  PHQ-9: Recent Review Flowsheet Data    Dep[facility] screen [facility] 01/21/2020 01/12/2014   Decreased Interest 0 1   Down, Depressed, Hopeless 0 1   PHQ - 2 Score 0 2   Altered sleeping - 2   Tired, decreased energy - 1   Change in appetite - 0   Feeling bad or failure about yourself  - 0   Trouble concentrating - 1   Moving slowly or fidgety/restless - 0   Suicidal thoughts - 0   PHQ-9 Score - 6  Interpretation of Total Score  Total Score Depression Severity:  1-4 = Minimal depression, 5-9 = Mild depression, 10-14 = Moderate depression, 15-19 = Moderately severe depression, 20-27 = Severe depression   Psychosocial Evaluation and Intervention:   Psychosocial Re-Evaluation: Psychosocial Re-Evaluation    Row Name 02/11/20 1520             Psychosocial Re-Evaluation   Current issues with  Current Stress Concerns       Interventions  Encouraged to attend Cardiac Rehabilitation for the exercise       Continue Psychosocial Services   No Follow up required       Comments  Kemonte is a single parent who care for his autistic son         Initial Review   Source of Stress Concerns  Family          Psychosocial Discharge (Final Psychosocial Re-Evaluation): Psychosocial Re-Evaluation - 02/11/20 1520      Psychosocial Re-Evaluation   Current issues with  Current Stress Concerns    Interventions  Encouraged to attend Cardiac Rehabilitation for the exercise    Continue Psychosocial Services   No Follow up required    Comments  Adrick is a single parent who care for his autistic son      Initial Review   Source of Stress Concerns  Family       Vocational Rehabilitation: Provide vocational rehab assistance to qualifying candidates.   Vocational Rehab Evaluation & Intervention: Vocational Rehab -  01/21/20 1308      Initial Vocational Rehab Evaluation & Intervention   Assessment shows need for Vocational Rehabilitation  No   Andrik is disabled and does not need vocational rehab at this time      Education: Education Goals: Education classes will be provided on a weekly basis, covering required topics. Participant will state understanding/return demonstration of topics presented.  Learning Barriers/Preferences: Learning Barriers/Preferences - 01/21/20 1130      Learning Barriers/Preferences   Learning Barriers  None    Learning Preferences  Verbal Instruction;Skilled Demonstration;Individual Instruction       Education Topics: Hypertension, Hypertension Reduction -Define heart disease and high blood pressure. Discus how high blood pressure affects the body and ways to reduce high blood pressure.   Exercise and Your Heart -Discuss why it is important to exercise, the FITT principles of exercise, normal and abnormal responses to exercise, and how to exercise safely.   Angina -Discuss definition of angina, causes of angina, treatment of angina, and how to decrease risk of having angina.   Cardiac Medications -Review what the following cardiac medications are used for, how they affect the body, and side effects that may occur when taking the medications.  Medications include Aspirin, Beta blockers, calcium channel blockers, ACE Inhibitors, angiotensin receptor blockers, diuretics, digoxin, and antihyperlipidemics.   Congestive Heart Failure -Discuss the definition of CHF, how to live with CHF, the signs and symptoms of CHF, and how keep track of weight and sodium intake.   Heart Disease and Intimacy -Discus the effect sexual activity has on the heart, how changes occur during intimacy as we age, and safety during sexual activity.   Smoking Cessation / COPD -Discuss different methods to quit smoking, the health benefits of quitting smoking, and the definition of  COPD.   Nutrition I: Fats -Discuss the types of cholesterol, what cholesterol does to the heart, and how cholesterol levels can be controlled.   Nutrition II: Labels -Discuss the different components of food labels  and how to read food label   Heart Parts/Heart Disease and PAD -Discuss the anatomy of the heart, the pathway of blood circulation through the heart, and these are affected by heart disease.   Stress I: Signs and Symptoms -Discuss the causes of stress, how stress may lead to anxiety and depression, and ways to limit stress.   Stress II: Relaxation -Discuss different types of relaxation techniques to limit stress.   Warning Signs of Stroke / TIA -Discuss definition of a stroke, what the signs and symptoms are of a stroke, and how to identify when someone is having stroke.   Knowledge Questionnaire Score: Knowledge Questionnaire Score - 01/21/20 1115      Knowledge Questionnaire Score   Pre Score  18/24       Core Components/Risk Factors/Patient Goals at Admission: Personal Goals and Risk Factors at Admission - 01/21/20 1309      Core Components/Risk Factors/Patient Goals on Admission    Weight Management  Yes;Obesity;Weight Loss    Intervention  Weight Management: Develop a combined nutrition and exercise program designed to reach desired caloric intake, while maintaining appropriate intake of nutrient and fiber, sodium and fats, and appropriate energy expenditure required for the weight goal.;Weight Management: Provide education and appropriate resources to help participant work on and attain dietary goals.;Weight Management/Obesity: Establish reasonable short term and long term weight goals.    Diabetes  Yes    Intervention  Provide education about signs/symptoms and action to take for hypo/hyperglycemia.;Provide education about proper nutrition, including hydration, and aerobic/resistive exercise prescription along with prescribed medications to achieve blood  glucose in normal ranges: Fasting glucose 65-99 mg/dL    Expected Outcomes  Short Term: Participant verbalizes understanding of the signs/symptoms and immediate care of hyper/hypoglycemia, proper foot care and importance of medication, aerobic/resistive exercise and nutrition plan for blood glucose control.;Long Term: Attainment of HbA1C < 7%.    Hypertension  Yes    Intervention  Monitor prescription use compliance.;Provide education on lifestyle modifcations including regular physical activity/exercise, weight management, moderate sodium restriction and increased consumption of fresh fruit, vegetables, and low fat dairy, alcohol moderation, and smoking cessation.    Expected Outcomes  Long Term: Maintenance of blood pressure at goal levels.;Short Term: Continued assessment and intervention until BP is < 140/58mm HG in hypertensive participants. < 130/54mm HG in hypertensive participants with diabetes, heart failure or chronic kidney disease.    Lipids  Yes    Intervention  Provide education and support for participant on nutrition & aerobic/resistive exercise along with prescribed medications to achieve LDL 70mg , HDL >40mg .    Expected Outcomes  Long Term: Cholesterol controlled with medications as prescribed, with individualized exercise RX and with personalized nutrition plan. Value goals: LDL < 70mg , HDL > 40 mg.;Short Term: Participant states understanding of desired cholesterol values and is compliant with medications prescribed. Participant is following exercise prescription and nutrition guidelines.    Stress  Yes    Intervention  Offer individual and/or small group education and counseling on adjustment to heart disease, stress management and health-related lifestyle change. Teach and support self-help strategies.;Refer participants experiencing significant psychosocial distress to appropriate mental health specialists for further evaluation and treatment. When possible, include family members  and significant others in education/counseling sessions.    Expected Outcomes  Short Term: Participant demonstrates changes in health-related behavior, relaxation and other stress management skills, ability to obtain effective social support, and compliance with psychotropic medications if prescribed.;Long Term: Emotional wellbeing is indicated by absence of clinically  significant psychosocial distress or social isolation.       Core Components/Risk Factors/Patient Goals Review:  Goals and Risk Factor Review    Row Name 02/01/20 1219 02/11/20 1521           Core Components/Risk Factors/Patient Goals Review   Personal Goals Review  Weight Management/Obesity;Diabetes;Hypertension;Lipids;Stress  Weight Management/Obesity;Diabetes;Hypertension;Lipids;Stress      Review  Zakiah started exercise at cardiac rehab. Furman's blood pressures were elevated. Valente's BP's were forwarded to Dr Jens Som for review  Jeremyah has been absent from cardiac rehab this week will continue to monitor BP's when in attendance      Expected Outcomes  Kaydenn will continue to partcipate in phase 2 cardiac rehab for exercise, diet and lifestyle modifications  Kyheem will continue to partcipate in phase 2 cardiac rehab for exercise, diet and lifestyle modifications         Core Components/Risk Factors/Patient Goals at Discharge (Final Review):  Goals and Risk Factor Review - 02/11/20 1521      Core Components/Risk Factors/Patient Goals Review   Personal Goals Review  Weight Management/Obesity;Diabetes;Hypertension;Lipids;Stress    Review  Olanda has been absent from cardiac rehab this week will continue to monitor BP's when in attendance    Expected Outcomes  Erric will continue to partcipate in phase 2 cardiac rehab for exercise, diet and lifestyle modifications       ITP Comments: ITP Comments    Row Name 01/21/20 0933 02/11/20 1518         ITP Comments  Dr. Armanda Magic, Medical Director  30 Day ITP Review. Bartlett is off to a good  start to exercise. Blayze is currently out of town and only plans to participate in the program for a couple of weeks.         Comments: See ITP Comments.Gladstone Lighter, RN,BSN 02/11/2020 3:25 PM

## 2020-02-12 ENCOUNTER — Encounter (HOSPITAL_COMMUNITY): Payer: Medicare Other

## 2020-02-15 ENCOUNTER — Telehealth (HOSPITAL_COMMUNITY): Payer: Self-pay | Admitting: *Deleted

## 2020-02-15 ENCOUNTER — Encounter (HOSPITAL_COMMUNITY): Payer: Medicare Other

## 2020-02-15 NOTE — Telephone Encounter (Signed)
John May said that he will not be returning this week to participate in phase 2 cardiac rehab as he needs to watch his son who has autism and will be out for the summer. Will discharge from the program. Gladstone Lighter, RN,BSN 02/15/2020 10:33 AM

## 2020-02-17 ENCOUNTER — Encounter (HOSPITAL_COMMUNITY): Payer: Medicare Other

## 2020-02-19 ENCOUNTER — Encounter (HOSPITAL_COMMUNITY): Payer: Medicare Other

## 2020-02-22 ENCOUNTER — Encounter (HOSPITAL_COMMUNITY): Payer: Medicare Other

## 2020-02-23 NOTE — Progress Notes (Signed)
HPI: FU coronary artery disease. Patient suffered a myocardial infarction in May of 2011. Ultimately had PCI of RCA. Monitor 10/17 showed sinus to sinus tach.Cath 2/21 showed severe 3 vessel CAD. Echo 2/21 showed normal LV function. Carotid dopplers showed 1-39 bilateral stenosis. Had CABG 2/21 with LIMA to LAD, radial to PDA, free RIMA to OM1 and OM2, SVG to apical portion of LAD and D1. Since I last saw him,pt denies dyspnea, chest pain or syncope.  Some residual chest soreness from recent sternotomy.  Current Outpatient Medications  Medication Sig Dispense Refill  . aspirin EC 81 MG tablet Take 81 mg by mouth daily.    . clopidogrel (PLAVIX) 75 MG tablet Take 1 tablet (75 mg total) by mouth daily. 90 tablet 3  . ezetimibe (ZETIA) 10 MG tablet TAKE 1 TABLET BY MOUTH EVERY DAY (Patient taking differently: Take 10 mg by mouth daily. ) 30 tablet 6  . hydrochlorothiazide (HYDRODIURIL) 25 MG tablet Take 25 mg by mouth daily.    Marland Kitchen JARDIANCE 25 MG TABS tablet Take 25 mg by mouth daily.  5  . lisinopril (ZESTRIL) 5 MG tablet Take 5 mg by mouth daily.    . metFORMIN (GLUCOPHAGE) 1000 MG tablet Take 1,000 mg by mouth 2 (two) times daily.  5  . metoprolol succinate (TOPROL-XL) 25 MG 24 hr tablet Take 25 mg by mouth daily.    . pioglitazone (ACTOS) 30 MG tablet Take 30 mg by mouth daily.  5  . rosuvastatin (CRESTOR) 40 MG tablet TAKE 1 TABLET BY MOUTH EVERY DAY (Patient taking differently: Take 40 mg by mouth daily. ) 30 tablet 10  . Vitamin D, Ergocalciferol, (DRISDOL) 50000 units CAPS capsule Take 50,000 Units by mouth once a week. Sundays  5   No current facility-administered medications for this visit.     Past Medical History:  Diagnosis Date  . CAD (coronary artery disease)    a. 01/2010 - MI with unsuccessful PCI of RCA. b. s/p PCI/DES to mid RCA 05/2012. c. Cath 11/2012 for abnl stress test - diffuse branch and distal vessel CAD for med rx.    . Diabetes mellitus (HCC)    a. A1c 6.9  in 11/2012.  Marland Kitchen Hyperlipidemia   . Hypertension   . OSA on CPAP   . Paresthesias    a. bilat upper & lower extremities - intermittently present since 2011.    Past Surgical History:  Procedure Laterality Date  . CORONARY ANGIOPLASTY WITH STENT PLACEMENT  05/14/2012   "1"  . CORONARY ARTERY BYPASS GRAFT N/A 11/03/2019   Procedure: CORONARY ARTERY BYPASS GRAFTING (CABG) using LIMA to LAD(m); Free RIMA to OM1 and OM2; Left radial artery to PDA; Right Greater Saphenous vein endoscopic harvested: SVG to Diag1; SVG to distal LAD.;  Surgeon: Linden Dolin, MD;  Location: [facility] OR;  Service: Open Heart Surgery;  Laterality: N/A;  Left UE (arm) open radial artery harvest; Right LE  (leg) endoscopic harvest.  . ENDOVEIN HARVEST OF GREATER SAPHENOUS VEIN Right 11/03/2019   Procedure: Mack Guise Of Greater Saphenous Vein of right LE.;  Surgeon: Linden Dolin, MD;  Location: [facility] OR;  Service: Open Heart Surgery;  Laterality: Right;  . LEFT HEART CATH AND CORONARY ANGIOGRAPHY N/A 11/02/2019   Procedure: LEFT HEART CATH AND CORONARY ANGIOGRAPHY;  Surgeon: Kathleene Hazel, MD;  Location: [facility] INVASIVE CV LAB;  Service: Cardiovascular;  Laterality: N/A;  . LEFT HEART CATHETERIZATION WITH CORONARY ANGIOGRAM N/A 11/25/2012  Procedure: LEFT HEART CATHETERIZATION WITH CORONARY ANGIOGRAM;  Surgeon: Laurey Morale, MD;  Location: [facility] CATH LAB;  Service: Cardiovascular;  Laterality: N/A;  . PATELLAR TENDON REPAIR  1990's   left  . PERCUTANEOUS CORONARY STENT INTERVENTION (PCI-S) N/A 05/14/2012   Procedure: PERCUTANEOUS CORONARY STENT INTERVENTION (PCI-S);  Surgeon: Kathleene Hazel, MD;  Location: [facility] CATH LAB;  Service: Cardiovascular;  Laterality: N/A;  . RADIAL ARTERY HARVEST Left 11/03/2019   Procedure: RADIAL ARTERY HARVEST;  Surgeon: Linden Dolin, MD;  Location: [facility] OR;  Service: Open Heart Surgery;  Laterality: Left;  . TEE WITHOUT CARDIOVERSION N/A 11/03/2019   Procedure: TRANSESOPHAGEAL  ECHOCARDIOGRAM (TEE);  Surgeon: Linden Dolin, MD;  Location: [facility] OR;  Service: Open Heart Surgery;  Laterality: N/A;    Social History   Socioeconomic History  . Marital status: Divorced    Spouse name: Not on file  . Number of children: 2  . Years of education: 58  . Highest education level: High school graduate  Occupational History    Employer: UNEMPLOYED  Tobacco Use  . Smoking status: Former Smoker    Packs/day: 0.50    Years: 18.00    Pack years: 9.00    Types: Cigarettes    Quit date: 11/26/2003    Years since quitting: 16.2  . Smokeless tobacco: Never Used  Vaping Use  . Vaping Use: Never used  Substance and Sexual Activity  . Alcohol use: No    Alcohol/week: 0.0 standard drinks  . Drug use: No  . Sexual activity: Yes  Other Topics Concern  . Not on file  Social History Narrative  . Not on file   Social Determinants of Health   Financial Resource Strain:   . Difficulty of Paying Living Expenses:   Food Insecurity:   . Worried About Programme researcher, broadcasting/film/video in the Last Year:   . Barista in the Last Year:   Transportation Needs:   . Freight forwarder (Medical):   Marland Kitchen Lack of Transportation (Non-Medical):   Physical Activity:   . Days of Exercise per Week:   . Minutes of Exercise per Session:   Stress:   . Feeling of Stress :   Social Connections:   . Frequency of Communication with Friends and Family:   . Frequency of Social Gatherings with Friends and Family:   . Attends Religious Services:   . Active Member of Clubs or Organizations:   . Attends Banker Meetings:   Marland Kitchen Marital Status:   Intimate Partner Violence:   . Fear of Current or Ex-Partner:   . Emotionally Abused:   Marland Kitchen Physically Abused:   . Sexually Abused:     Family History  Problem Relation Age of Onset  . Hypertension Mother   . Cancer Mother   . Diabetes Mother   . Stroke Mother   . Cancer Father     ROS: no fevers or chills, productive cough, hemoptysis,  dysphasia, odynophagia, melena, hematochezia, dysuria, hematuria, rash, seizure activity, orthopnea, PND, pedal edema, claudication. Remaining systems are negative.  Physical Exam: Well-developed well-nourished in no acute distress.  Skin is warm and dry.  HEENT is normal.  Neck is supple.  Chest is clear to auscultation with normal expansion.  Status post sternotomy without evidence of infection. Cardiovascular exam is regular rate and rhythm.  Abdominal exam nontender or distended. No masses palpated. Extremities show no edema. neuro grossly intact   A/P  1 CAD s/p CABG-continue ASA and  statin.  2 hypertension-BP elevated; increase lisinopril to 20 mg daily.  Follow blood pressure and adjust regimen as needed.  3 Hyperlipidemia-continue crestor and zetia.  4 Palpitations-continue toprol.  Kirk Ruths, MD

## 2020-02-24 ENCOUNTER — Encounter (HOSPITAL_COMMUNITY): Payer: Medicare Other

## 2020-02-26 ENCOUNTER — Encounter (HOSPITAL_COMMUNITY): Payer: Medicare Other

## 2020-02-26 LAB — LIPID PANEL
Chol/HDL Ratio: 2.4 ratio (ref 0.0–5.0)
Cholesterol, Total: 94 mg/dL — ABNORMAL LOW (ref 100–199)
HDL: 39 mg/dL — ABNORMAL LOW (ref >39)
LDL Chol Calc (NIH): 39 mg/dL (ref 0–99)
Triglycerides: 74 mg/dL (ref 0–149)
VLDL Cholesterol Cal: 16 mg/dL (ref 5–40)

## 2020-02-26 LAB — BASIC METABOLIC PANEL
BUN/Creatinine Ratio: 12 (ref 9–20)
BUN: 14 mg/dL (ref 6–24)
CO2: 21 mmol/L (ref 20–29)
Calcium: 9 mg/dL (ref 8.7–10.2)
Chloride: 107 mmol/L — ABNORMAL HIGH (ref 96–106)
Creatinine, Ser: 1.13 mg/dL (ref 0.76–1.27)
GFR calc Af Amer: 87 mL/min/{1.73_m2} (ref >59)
GFR calc non Af Amer: 75 mL/min/{1.73_m2} (ref >59)
Glucose: 81 mg/dL (ref 65–99)
Potassium: 4 mmol/L (ref 3.5–5.2)
Sodium: 141 mmol/L (ref 134–144)

## 2020-02-26 LAB — HEPATIC FUNCTION PANEL
ALT: 21 IU/L (ref 0–44)
AST: 38 IU/L (ref 0–40)
Albumin: 4.6 g/dL (ref 3.8–4.9)
Alkaline Phosphatase: 75 IU/L (ref 48–121)
Bilirubin Total: 0.3 mg/dL (ref 0.0–1.2)
Bilirubin, Direct: 0.09 mg/dL (ref 0.00–0.40)
Total Protein: 7.3 g/dL (ref 6.0–8.5)

## 2020-02-29 ENCOUNTER — Encounter: Payer: Self-pay | Admitting: *Deleted

## 2020-02-29 ENCOUNTER — Encounter (HOSPITAL_COMMUNITY): Payer: Medicare Other

## 2020-03-01 ENCOUNTER — Encounter: Payer: Self-pay | Admitting: Cardiology

## 2020-03-01 ENCOUNTER — Ambulatory Visit (INDEPENDENT_AMBULATORY_CARE_PROVIDER_SITE_OTHER): Payer: Medicare Other | Admitting: Cardiology

## 2020-03-01 ENCOUNTER — Other Ambulatory Visit: Payer: Self-pay

## 2020-03-01 VITALS — BP 160/100 | HR 71 | Temp 97.0°F | Ht 67.0 in | Wt 203.2 lb

## 2020-03-01 DIAGNOSIS — E782 Mixed hyperlipidemia: Secondary | ICD-10-CM | POA: Diagnosis not present

## 2020-03-01 DIAGNOSIS — Z951 Presence of aortocoronary bypass graft: Secondary | ICD-10-CM

## 2020-03-01 DIAGNOSIS — I10 Essential (primary) hypertension: Secondary | ICD-10-CM

## 2020-03-01 DIAGNOSIS — I249 Acute ischemic heart disease, unspecified: Secondary | ICD-10-CM

## 2020-03-01 MED ORDER — LISINOPRIL 20 MG PO TABS: 20.0000 mg | ORAL_TABLET | Freq: Every day | ORAL | 3 refills | Status: DC

## 2020-03-01 NOTE — Patient Instructions (Signed)
Medication Instructions:   INCREASE LISINOPRIL TO 20 MG ONCE DAILY=4 OF THE 5 MG TABLETS ONCE DAILY  *If you need a refill on your cardiac medications before your next appointment, please call your pharmacy*   Lab Work: If you have labs (blood work) drawn today and your tests are completely normal, you will receive your results only by: Marland Kitchen MyChart Message (if you have MyChart) OR . A paper copy in the mail If you have any lab test that is abnormal or we need to change your treatment, we will call you to review the results   Follow-Up: At [facility], you and your health needs are our priority.  As part of our continuing mission to provide you with exceptional heart care, we have created designated Provider Care Teams.  These Care Teams include your primary Cardiologist (physician) and Advanced Practice Providers (APPs -  Physician Assistants and Nurse Practitioners) who all work together to provide you with the care you need, when you need it.  We recommend signing up for the patient portal called "MyChart".  Sign up information is provided on this After Visit Summary.  MyChart is used to connect with patients for Virtual Visits (Telemedicine).  Patients are able to view lab/test results, encounter notes, upcoming appointments, etc.  Non-urgent messages can be sent to your provider as well.   To learn more about what you can do with MyChart, go to [email or web address].    Your next appointment:   6 month(s)  The format for your next appointment:   Either In Person or Virtual  Provider:   You may see Olga Millers, MD or one of the following Advanced Practice Providers on your designated Care Team:    Corine Shelter, PA-C  [place], [place]  Edd Fabian, [place]

## 2020-03-02 ENCOUNTER — Encounter (HOSPITAL_COMMUNITY): Payer: Medicare Other

## 2020-03-04 ENCOUNTER — Encounter (HOSPITAL_COMMUNITY): Payer: Medicare Other

## 2020-03-07 ENCOUNTER — Encounter (HOSPITAL_COMMUNITY): Payer: Medicare Other

## 2020-03-09 ENCOUNTER — Encounter (HOSPITAL_COMMUNITY): Payer: Medicare Other

## 2020-03-11 ENCOUNTER — Encounter (HOSPITAL_COMMUNITY): Payer: Medicare Other

## 2020-03-16 ENCOUNTER — Encounter (HOSPITAL_COMMUNITY): Payer: Medicare Other

## 2020-03-18 ENCOUNTER — Encounter (HOSPITAL_COMMUNITY): Payer: Medicare Other

## 2020-03-21 ENCOUNTER — Encounter (HOSPITAL_COMMUNITY): Payer: Medicare Other

## 2020-03-23 ENCOUNTER — Encounter (HOSPITAL_COMMUNITY): Payer: Medicare Other

## 2020-03-25 ENCOUNTER — Encounter (HOSPITAL_COMMUNITY): Payer: Medicare Other

## 2020-06-01 ENCOUNTER — Telehealth: Payer: Self-pay | Admitting: Cardiology

## 2020-06-01 NOTE — Telephone Encounter (Signed)
Patient called stating he has been having heartburn the past couple of weeks. It states it comes and goes, he takes a Rolaids and that helps it.  He is not sure if it's the medication he is on that it's causing it.

## 2020-06-01 NOTE — Telephone Encounter (Signed)
Spoke with pt who report for the past couple of weeks he's been experiencing heart burn off an on. He voiced symptoms are not quite similar to what is was like when he had his heart attack but notice it when he's walking or driving.  Appointment scheduled for tomorrow 9/23 at 3:15 pm for further evaluations.

## 2020-06-02 ENCOUNTER — Ambulatory Visit (INDEPENDENT_AMBULATORY_CARE_PROVIDER_SITE_OTHER): Payer: Medicare Other | Admitting: Physician Assistant

## 2020-06-02 ENCOUNTER — Encounter: Payer: Self-pay | Admitting: Physician Assistant

## 2020-06-02 ENCOUNTER — Other Ambulatory Visit: Payer: Self-pay

## 2020-06-02 VITALS — BP 170/101 | HR 91 | Ht 67.0 in | Wt 207.0 lb

## 2020-06-02 DIAGNOSIS — I10 Essential (primary) hypertension: Secondary | ICD-10-CM

## 2020-06-02 DIAGNOSIS — G4733 Obstructive sleep apnea (adult) (pediatric): Secondary | ICD-10-CM

## 2020-06-02 DIAGNOSIS — I249 Acute ischemic heart disease, unspecified: Secondary | ICD-10-CM

## 2020-06-02 DIAGNOSIS — R0789 Other chest pain: Secondary | ICD-10-CM

## 2020-06-02 DIAGNOSIS — E119 Type 2 diabetes mellitus without complications: Secondary | ICD-10-CM

## 2020-06-02 DIAGNOSIS — Z9989 Dependence on other enabling machines and devices: Secondary | ICD-10-CM

## 2020-06-02 DIAGNOSIS — I25119 Atherosclerotic heart disease of native coronary artery with unspecified angina pectoris: Secondary | ICD-10-CM

## 2020-06-02 DIAGNOSIS — E785 Hyperlipidemia, unspecified: Secondary | ICD-10-CM | POA: Diagnosis not present

## 2020-06-02 NOTE — Progress Notes (Signed)
Cardiology Office Note:    Date:  06/04/2020   ID:  John May, DOB 1966/06/16, MRN [id number]  PCP:  Fleet Contras, MD  [facility] Cardiologist:  Olga Millers, MD  [facility] Electrophysiologist:  None   Referring MD: Fleet Contras, MD   Chief Complaint  Patient presents with  . Follow-up    seen for Dr. Jens Som    History of Present Illness:    John May is a 54 y.o. male with a hx of CAD, hypertension, hyperlipidemia, DM 2 and obstructive sleep apnea on CPAP therapy.  Patient had a acute MI in May 2011 and underwent PCI to RCA.  Her monitor in October 2017 shows sinus rhythm and sinus tachycardia.  Repeat cardiac catheterization in February 2021 showed severe three-vessel disease.  Echocardiogram showed normal EF.  Carotid Doppler showed mild disease bilaterally.  He eventually underwent CABG x6 with LIMA to LAD, free radial to PDA, free RIMA to OM1 and OM 2, and SVG to apical portion of LAD and the D1.  He was last seen by Dr. Jens Som on 03/01/2020, blood pressure was 160/100 at the time.  Patient presents today for evaluation of atypical chest pain.  Symptoms started on Tuesday after attending her sister's funeral.  On his way back, he ate a sandwich.  Afterward, he started having intermittent epigastric pain/lower chest pain associated with burping episode.  The location of the symptom is lower than the previous angina.  Symptom only lasted on Tuesday and resolved since then.  His symptom is more consistent with GI issue.  EKG is unchanged.  I recommended a course of Protonix.  His blood pressure is elevated, I also recommended increase metoprolol succinate to 50 mg daily.  I plan to see the patient back in 3 to 4 weeks, if chest pain does not recur, no further work-up is needed.   Past Medical History:  Diagnosis Date  . CAD (coronary artery disease)    a. 01/2010 - MI with unsuccessful PCI of RCA. b. s/p PCI/DES to mid RCA 05/2012. c. Cath 11/2012 for abnl stress test  - diffuse branch and distal vessel CAD for med rx.    . Diabetes mellitus (HCC)    a. A1c 6.9 in 11/2012.  Marland Kitchen Hyperlipidemia   . Hypertension   . OSA on CPAP   . Paresthesias    a. bilat upper & lower extremities - intermittently present since 2011.    Past Surgical History:  Procedure Laterality Date  . CORONARY ANGIOPLASTY WITH STENT PLACEMENT  05/14/2012   "1"  . CORONARY ARTERY BYPASS GRAFT N/A 11/03/2019   Procedure: CORONARY ARTERY BYPASS GRAFTING (CABG) using LIMA to LAD(m); Free RIMA to OM1 and OM2; Left radial artery to PDA; Right Greater Saphenous vein endoscopic harvested: SVG to Diag1; SVG to distal LAD.;  Surgeon: Linden Dolin, MD;  Location: [facility] OR;  Service: Open Heart Surgery;  Laterality: N/A;  Left UE (arm) open radial artery harvest; Right LE  (leg) endoscopic harvest.  . ENDOVEIN HARVEST OF GREATER SAPHENOUS VEIN Right 11/03/2019   Procedure: Mack Guise Of Greater Saphenous Vein of right LE.;  Surgeon: Linden Dolin, MD;  Location: [facility] OR;  Service: Open Heart Surgery;  Laterality: Right;  . LEFT HEART CATH AND CORONARY ANGIOGRAPHY N/A 11/02/2019   Procedure: LEFT HEART CATH AND CORONARY ANGIOGRAPHY;  Surgeon: Kathleene Hazel, MD;  Location: [facility] INVASIVE CV LAB;  Service: Cardiovascular;  Laterality: N/A;  . LEFT HEART CATHETERIZATION WITH CORONARY  ANGIOGRAM N/A 11/25/2012   Procedure: LEFT HEART CATHETERIZATION WITH CORONARY ANGIOGRAM;  Surgeon: Laurey Morale, MD;  Location: [facility] CATH LAB;  Service: Cardiovascular;  Laterality: N/A;  . PATELLAR TENDON REPAIR  1990's   left  . PERCUTANEOUS CORONARY STENT INTERVENTION (PCI-S) N/A 05/14/2012   Procedure: PERCUTANEOUS CORONARY STENT INTERVENTION (PCI-S);  Surgeon: Kathleene Hazel, MD;  Location: [facility] CATH LAB;  Service: Cardiovascular;  Laterality: N/A;  . RADIAL ARTERY HARVEST Left 11/03/2019   Procedure: RADIAL ARTERY HARVEST;  Surgeon: Linden Dolin, MD;  Location: [facility] OR;  Service: Open Heart Surgery;   Laterality: Left;  . TEE WITHOUT CARDIOVERSION N/A 11/03/2019   Procedure: TRANSESOPHAGEAL ECHOCARDIOGRAM (TEE);  Surgeon: Linden Dolin, MD;  Location: [facility] OR;  Service: Open Heart Surgery;  Laterality: N/A;    Current Medications: Current Meds  Medication Sig  . aspirin EC 81 MG tablet Take 81 mg by mouth daily.  . clopidogrel (PLAVIX) 75 MG tablet Take 1 tablet (75 mg total) by mouth daily.  Marland Kitchen ezetimibe (ZETIA) 10 MG tablet TAKE 1 TABLET BY MOUTH EVERY DAY (Patient taking differently: Take 10 mg by mouth daily. )  . hydrochlorothiazide (HYDRODIURIL) 25 MG tablet Take 25 mg by mouth daily.  Marland Kitchen JARDIANCE 25 MG TABS tablet Take 25 mg by mouth daily.  Marland Kitchen lisinopril (ZESTRIL) 20 MG tablet Take 1 tablet (20 mg total) by mouth daily.  . metFORMIN (GLUCOPHAGE) 1000 MG tablet Take 1,000 mg by mouth 2 (two) times daily.  . metoprolol succinate (TOPROL-XL) 25 MG 24 hr tablet Take 25 mg by mouth daily.  . pioglitazone (ACTOS) 30 MG tablet Take 30 mg by mouth daily.  . rosuvastatin (CRESTOR) 40 MG tablet TAKE 1 TABLET BY MOUTH EVERY DAY (Patient taking differently: Take 40 mg by mouth daily. )  . Vitamin D, Ergocalciferol, (DRISDOL) 50000 units CAPS capsule Take 50,000 Units by mouth once a week. Sundays     Allergies:   Bee venom, Food allergy formula, Crab [shellfish allergy], and Nutritional supplements   Social History   Socioeconomic History  . Marital status: Divorced    Spouse name: Not on file  . Number of children: 2  . Years of education: 87  . Highest education level: High school graduate  Occupational History    Employer: UNEMPLOYED  Tobacco Use  . Smoking status: Former Smoker    Packs/day: 0.50    Years: 18.00    Pack years: 9.00    Types: Cigarettes    Quit date: 11/26/2003    Years since quitting: 16.5  . Smokeless tobacco: Never Used  Vaping Use  . Vaping Use: Never used  Substance and Sexual Activity  . Alcohol use: No    Alcohol/week: 0.0 standard drinks  . Drug  use: No  . Sexual activity: Yes  Other Topics Concern  . Not on file  Social History Narrative  . Not on file   Social Determinants of Health   Financial Resource Strain:   . Difficulty of Paying Living Expenses: Not on file  Food Insecurity:   . Worried About Programme researcher, broadcasting/film/video in the Last Year: Not on file  . Ran Out of Food in the Last Year: Not on file  Transportation Needs:   . Lack of Transportation (Medical): Not on file  . Lack of Transportation (Non-Medical): Not on file  Physical Activity:   . Days of Exercise per Week: Not on file  . Minutes of Exercise per Session: Not on file  Stress:   . Feeling of Stress : Not on file  Social Connections:   . Frequency of Communication with Friends and Family: Not on file  . Frequency of Social Gatherings with Friends and Family: Not on file  . Attends Religious Services: Not on file  . Active Member of Clubs or Organizations: Not on file  . Attends BankerClub or Organization Meetings: Not on file  . Marital Status: Not on file     Family History: The patient's family history includes Cancer in his father and mother; Diabetes in his mother; Hypertension in his mother; Stroke in his mother.  ROS:   Please see the history of present illness.     All other systems reviewed and are negative.  EKGs/Labs/Other Studies Reviewed:    The following studies were reviewed today:  Echo 11/01/2019 1. Left ventricular ejection fraction, by estimation, is 55%. The left  ventricle has normal function. The left ventricle demonstrates regional  wall motion abnormalities (see scoring diagram/findings for description).  There is mild left ventricular  hypertrophy. Left ventricular diastolic parameters are indeterminate.  2. Right ventricular systolic function is normal. The right ventricular  size is normal. Tricuspid regurgitation signal is inadequate for assessing  PA pressure.  3. The mitral valve is grossly normal. Trivial mitral valve    regurgitation.  4. The aortic valve is tricuspid. Aortic valve regurgitation is not  visualized.    Cath 11/02/2019  1st Mrg lesion is 90% stenosed.  2nd Mrg lesion is 90% stenosed.  Mid Cx lesion is 90% stenosed.  1st Diag lesion is 95% stenosed.  Mid LAD lesion is 99% stenosed.  Dist LAD lesion is 99% stenosed.  Dist RCA lesion is 100% stenosed.  Prox RCA to Mid RCA lesion is 99% stenosed.  Prox RCA lesion is 50% stenosed.   Severe triple vessel CAD  Recommendations: Severe multi-vessel CAD. Will consult CT surgery for consideration of CABG which appears to be the best method revascularization given the complexity of his disease.    EKG:  EKG is ordered today.  The ekg ordered today demonstrates normal sinus rhythm, Q waves in the inferior lead.  Poor R wave progression in the anterior leads.  Recent Labs: 10/31/2019: B Natriuretic Peptide 87.7 11/04/2019: Magnesium 2.4 11/07/2019: Hemoglobin 10.2; Platelets 233 02/25/2020: ALT 21; BUN 14; Creatinine, Ser 1.13; Potassium 4.0; Sodium 141  Recent Lipid Panel    Component Value Date/Time   CHOL 94 (L) 02/25/2020 1222   TRIG 74 02/25/2020 1222   HDL 39 (L) 02/25/2020 1222   CHOLHDL 2.4 02/25/2020 1222   CHOLHDL 7.1 11/01/2019 0502   VLDL 50 (H) 11/01/2019 0502   LDLCALC 39 02/25/2020 1222    Physical Exam:    VS:  BP (!) 170/101   Pulse 91   Ht 5\' 7"  (1.702 m)   Wt 207 lb (93.9 kg)   SpO2 99%   BMI 32.42 kg/m     Wt Readings from Last 3 Encounters:  06/02/20 207 lb (93.9 kg)  03/01/20 203 lb 3.2 oz (92.2 kg)  01/21/20 197 lb 1.5 oz (89.4 kg)     GEN:  Well nourished, well developed in no acute distress HEENT: Normal NECK: No JVD; No carotid bruits LYMPHATICS: No lymphadenopathy CARDIAC: RRR, no murmurs, rubs, gallops RESPIRATORY:  Clear to auscultation without rales, wheezing or rhonchi  ABDOMEN: Soft, non-tender, non-distended MUSCULOSKELETAL:  No edema; No deformity  SKIN: Warm and  dry NEUROLOGIC:  Alert and oriented x 3 PSYCHIATRIC:  Normal affect   ASSESSMENT:    1. Atypical chest pain   2. Coronary artery disease involving native coronary artery of native heart with angina pectoris (HCC)   3. Essential hypertension   4. Hyperlipidemia LDL goal <70   5. Controlled type 2 diabetes mellitus without complication, without long-term current use of insulin (HCC)   6. OSA on CPAP    PLAN:    In order of problems listed above:  1. Atypical chest pain: The location of the chest pain is in the epigastric and lower chest area, this is different from the previous angina. Symptom occurred on Tuesday after he was driving back from his sister's funeral. Symptom sounds more GI in nature. I recommended a trial of Protonix. I plan to see the patient back in a few weeks for follow-up  2. CAD s/p CABG: Underwent bypass surgery in February. Continue aspirin and Plavix.  3. Hypertension: Blood pressure is elevated today. Will increase metoprolol succinate to 50 mg daily  4. Hyperlipidemia: On Crestor  5. DM2: Managed by primary care provider  6. Obstructive sleep apnea: On CPAP therapy.   Medication Adjustments/Labs and Tests Ordered: Current medicines are reviewed at length with the patient today.  Concerns regarding medicines are outlined above.  Orders Placed This Encounter  Procedures  . EKG 12-Lead   No orders of the defined types were placed in this encounter.   Patient Instructions  Medication Instructions:   Take Metoprolol Succinate (Toprol-XL) 50 mg daily until next appointment *If you need a refill on your cardiac medications before your next appointment, please call your pharmacy*  Lab Work: NONE ordered at this time of appointment   If you have labs (blood work) drawn today and your tests are completely normal, you will receive your results only by: Marland Kitchen MyChart Message (if you have MyChart) OR . A paper copy in the mail If you have any lab test that is  abnormal or we need to change your treatment, we will call you to review the results.  Testing/Procedures: NONE ordered at this time of appointment   Follow-Up: At [facility], you and your health needs are our priority.  As part of our continuing mission to provide you with exceptional heart care, we have created designated Provider Care Teams.  These Care Teams include your primary Cardiologist (physician) and Advanced Practice Providers (APPs -  Physician Assistants and Nurse Practitioners) who all work together to provide you with the care you need, when you need it.  We recommend signing up for the patient portal called "MyChart".  Sign up information is provided on this After Visit Summary.  MyChart is used to connect with patients for Virtual Visits (Telemedicine).  Patients are able to view lab/test results, encounter notes, upcoming appointments, etc.  Non-urgent messages can be sent to your provider as well.   To learn more about what you can do with MyChart, go to [email or web address].    Your next appointment:   3 week(s)  The format for your next appointment:   In Person  Provider:   Azalee Course, PA-C  Other Instructions      Signed, Azalee Course, [place]  06/04/2020 8:35 PM    [facility]

## 2020-06-02 NOTE — Patient Instructions (Signed)
Medication Instructions:   Take Metoprolol Succinate (Toprol-XL) 50 mg daily until next appointment *If you need a refill on your cardiac medications before your next appointment, please call your pharmacy*  Lab Work: NONE ordered at this time of appointment   If you have labs (blood work) drawn today and your tests are completely normal, you will receive your results only by: Marland Kitchen MyChart Message (if you have MyChart) OR . A paper copy in the mail If you have any lab test that is abnormal or we need to change your treatment, we will call you to review the results.  Testing/Procedures: NONE ordered at this time of appointment   Follow-Up: At [facility] [place], you and your health needs are our priority.  As part of our continuing mission to provide you with exceptional heart care, we have created designated Provider Care Teams.  These Care Teams include your primary Cardiologist (physician) and Advanced Practice Providers (APPs -  Physician Assistants and Nurse Practitioners) who all work together to provide you with the care you need, when you need it.  We recommend signing up for the patient portal called "MyChart".  Sign up information is provided on this After Visit Summary.  MyChart is used to connect with patients for Virtual Visits (Telemedicine).  Patients are able to view lab/test results, encounter notes, upcoming appointments, etc.  Non-urgent messages can be sent to your provider as well.   To learn more about what you can do with MyChart, go to [email or web address].    Your next appointment:   3 week(s)  The format for your next appointment:   In Person  Provider:   Azalee Course, PA-C  Other Instructions

## 2020-06-04 ENCOUNTER — Encounter: Payer: Self-pay | Admitting: Physician Assistant

## 2020-07-01 ENCOUNTER — Ambulatory Visit: Payer: Medicare Other | Admitting: Physician Assistant

## 2020-08-10 NOTE — Progress Notes (Signed)
HPI: FU coronary artery disease. Patient suffered a myocardial infarction in May of 2011. Ultimately had PCI of RCA. Monitor 10/17 showed sinus to sinus tach.Cath 2/21 showed severe 3 vessel CAD. Echo 2/21 showed normal LV function. Carotid dopplers showed 1-39 bilateral stenosis. Had CABG 2/21 with LIMA to LAD, radial to PDA, free RIMA to OM1 and OM2, SVG to apical portion of LAD and D1. Since I last saw him,he has some dyspnea on exertion but no orthopnea, PND, pedal edema, exertional chest pain or syncope.  Current Outpatient Medications  Medication Sig Dispense Refill  . aspirin EC 81 MG tablet Take 81 mg by mouth daily.    . clopidogrel (PLAVIX) 75 MG tablet Take 1 tablet (75 mg total) by mouth daily. 90 tablet 3  . ezetimibe (ZETIA) 10 MG tablet TAKE 1 TABLET BY MOUTH EVERY DAY (Patient taking differently: Take 10 mg by mouth daily.) 30 tablet 6  . hydrochlorothiazide (HYDRODIURIL) 25 MG tablet Take 25 mg by mouth daily.    Marland Kitchen JARDIANCE 25 MG TABS tablet Take 25 mg by mouth daily.  5  . lisinopril (ZESTRIL) 20 MG tablet Take 1 tablet (20 mg total) by mouth daily. 90 tablet 3  . metFORMIN (GLUCOPHAGE) 1000 MG tablet Take 1,000 mg by mouth 2 (two) times daily.  5  . metoprolol succinate (TOPROL-XL) 25 MG 24 hr tablet Take 25 mg by mouth daily.    . pioglitazone (ACTOS) 30 MG tablet Take 30 mg by mouth daily.  5  . rosuvastatin (CRESTOR) 40 MG tablet TAKE 1 TABLET BY MOUTH EVERY DAY (Patient taking differently: Take 40 mg by mouth daily.) 30 tablet 10  . Vitamin D, Ergocalciferol, (DRISDOL) 50000 units CAPS capsule Take 50,000 Units by mouth once a week. Sundays  5   No current facility-administered medications for this visit.     Past Medical History:  Diagnosis Date  . CAD (coronary artery disease)    a. 01/2010 - MI with unsuccessful PCI of RCA. b. s/p PCI/DES to mid RCA 05/2012. c. Cath 11/2012 for abnl stress test - diffuse branch and distal vessel CAD for med rx.    .  Diabetes mellitus (HCC)    a. A1c 6.9 in 11/2012.  Marland Kitchen Hyperlipidemia   . Hypertension   . OSA on CPAP   . Paresthesias    a. bilat upper & lower extremities - intermittently present since 2011.    Past Surgical History:  Procedure Laterality Date  . CORONARY ANGIOPLASTY WITH STENT PLACEMENT  05/14/2012   "1"  . CORONARY ARTERY BYPASS GRAFT N/A 11/03/2019   Procedure: CORONARY ARTERY BYPASS GRAFTING (CABG) using LIMA to LAD(m); Free RIMA to OM1 and OM2; Left radial artery to PDA; Right Greater Saphenous vein endoscopic harvested: SVG to Diag1; SVG to distal LAD.;  Surgeon: Linden Dolin, MD;  Location: [facility] OR;  Service: Open Heart Surgery;  Laterality: N/A;  Left UE (arm) open radial artery harvest; Right LE  (leg) endoscopic harvest.  . ENDOVEIN HARVEST OF GREATER SAPHENOUS VEIN Right 11/03/2019   Procedure: Mack Guise Of Greater Saphenous Vein of right LE.;  Surgeon: Linden Dolin, MD;  Location: [facility] OR;  Service: Open Heart Surgery;  Laterality: Right;  . LEFT HEART CATH AND CORONARY ANGIOGRAPHY N/A 11/02/2019   Procedure: LEFT HEART CATH AND CORONARY ANGIOGRAPHY;  Surgeon: Kathleene Hazel, MD;  Location: [facility] INVASIVE CV LAB;  Service: Cardiovascular;  Laterality: N/A;  . LEFT HEART CATHETERIZATION WITH CORONARY ANGIOGRAM  N/A 11/25/2012   Procedure: LEFT HEART CATHETERIZATION WITH CORONARY ANGIOGRAM;  Surgeon: Laurey Morale, MD;  Location: [facility] CATH LAB;  Service: Cardiovascular;  Laterality: N/A;  . PATELLAR TENDON REPAIR  1990's   left  . PERCUTANEOUS CORONARY STENT INTERVENTION (PCI-S) N/A 05/14/2012   Procedure: PERCUTANEOUS CORONARY STENT INTERVENTION (PCI-S);  Surgeon: Kathleene Hazel, MD;  Location: [facility] CATH LAB;  Service: Cardiovascular;  Laterality: N/A;  . RADIAL ARTERY HARVEST Left 11/03/2019   Procedure: RADIAL ARTERY HARVEST;  Surgeon: Linden Dolin, MD;  Location: [facility] OR;  Service: Open Heart Surgery;  Laterality: Left;  . TEE WITHOUT CARDIOVERSION N/A  11/03/2019   Procedure: TRANSESOPHAGEAL ECHOCARDIOGRAM (TEE);  Surgeon: Linden Dolin, MD;  Location: [facility] OR;  Service: Open Heart Surgery;  Laterality: N/A;    Social History   Socioeconomic History  . Marital status: Divorced    Spouse name: Not on file  . Number of children: 2  . Years of education: 61  . Highest education level: High school graduate  Occupational History    Employer: UNEMPLOYED  Tobacco Use  . Smoking status: Former Smoker    Packs/day: 0.50    Years: 18.00    Pack years: 9.00    Types: Cigarettes    Quit date: 11/26/2003    Years since quitting: 16.7  . Smokeless tobacco: Never Used  Vaping Use  . Vaping Use: Never used  Substance and Sexual Activity  . Alcohol use: No    Alcohol/week: 0.0 standard drinks  . Drug use: No  . Sexual activity: Yes  Other Topics Concern  . Not on file  Social History Narrative  . Not on file   Social Determinants of Health   Financial Resource Strain: Not on file  Food Insecurity: Not on file  Transportation Needs: Not on file  Physical Activity: Not on file  Stress: Not on file  Social Connections: Not on file  Intimate Partner Violence: Not on file    Family History  Problem Relation Age of Onset  . Hypertension Mother   . Cancer Mother   . Diabetes Mother   . Stroke Mother   . Cancer Father     ROS: Residual chest soreness but no fevers or chills, productive cough, hemoptysis, dysphasia, odynophagia, melena, hematochezia, dysuria, hematuria, rash, seizure activity, orthopnea, PND, pedal edema, claudication. Remaining systems are negative.  Physical Exam: Well-developed well-nourished in no acute distress.  Skin is warm and dry.  HEENT is normal.  Neck is supple.  Chest is clear to auscultation with normal expansion.  Cardiovascular exam is regular rate and rhythm.  Abdominal exam nontender or distended. No masses palpated. Extremities show no edema. neuro grossly intact  ECG-sinus rhythm at a  rate of 59, inferior infarct, anterior infarct.  Personally reviewed  A/P  1 coronary artery disease status post coronary artery bypass and graft-continue medical therapy with aspirin and statin.  2 hypertension-patient's blood pressure elevated; increase lisinopril to 40 mg daily and follow.  Check potassium and renal function in 1 week.  3 hyperlipidemia-continue present medical regimen.  Check lipids and liver.  4 palpitations-symptoms are recently well controlled with Toprol.  5 obstructive sleep apnea-continue CPAP.  Olga Millers, MD

## 2020-08-22 ENCOUNTER — Encounter: Payer: Self-pay | Admitting: Cardiology

## 2020-08-22 ENCOUNTER — Ambulatory Visit (INDEPENDENT_AMBULATORY_CARE_PROVIDER_SITE_OTHER): Payer: Medicare Other | Admitting: Cardiology

## 2020-08-22 ENCOUNTER — Other Ambulatory Visit: Payer: Self-pay

## 2020-08-22 VITALS — BP 148/90 | HR 59 | Ht 67.0 in | Wt 211.0 lb

## 2020-08-22 DIAGNOSIS — E785 Hyperlipidemia, unspecified: Secondary | ICD-10-CM

## 2020-08-22 DIAGNOSIS — I25119 Atherosclerotic heart disease of native coronary artery with unspecified angina pectoris: Secondary | ICD-10-CM | POA: Diagnosis not present

## 2020-08-22 DIAGNOSIS — I10 Essential (primary) hypertension: Secondary | ICD-10-CM

## 2020-08-22 DIAGNOSIS — I249 Acute ischemic heart disease, unspecified: Secondary | ICD-10-CM

## 2020-08-22 MED ORDER — LISINOPRIL 40 MG PO TABS: 40.0000 mg | ORAL_TABLET | Freq: Every day | ORAL | 3 refills | Status: DC

## 2020-08-22 NOTE — Patient Instructions (Signed)
Medication Instructions:   INCREASE LISINOPRIL TO 40 MG ONCE DAILY= 2 OF THE 20 MG TABLETS ONCE DAILY  *If you need a refill on your cardiac medications before your next appointment, please call your pharmacy*   Lab Work:  Your physician recommends that you return for lab work in: ONE WEEK FASTING  If you have labs (blood work) drawn today and your tests are completely normal, you will receive your results only by: Marland Kitchen MyChart Message (if you have MyChart) OR . A paper copy in the mail If you have any lab test that is abnormal or we need to change your treatment, we will call you to review the results.  Follow-Up: At [facility], you and your health needs are our priority.  As part of our continuing mission to provide you with exceptional heart care, we have created designated Provider Care Teams.  These Care Teams include your primary Cardiologist (physician) and Advanced Practice Providers (APPs -  Physician Assistants and Nurse Practitioners) who all work together to provide you with the care you need, when you need it.  We recommend signing up for the patient portal called "MyChart".  Sign up information is provided on this After Visit Summary.  MyChart is used to connect with patients for Virtual Visits (Telemedicine).  Patients are able to view lab/test results, encounter notes, upcoming appointments, etc.  Non-urgent messages can be sent to your provider as well.   To learn more about what you can do with MyChart, go to [email or web address].    Your next appointment:   12 month(s)  The format for your next appointment:   In Person  Provider:   Olga Millers, MD

## 2020-09-05 LAB — COMPREHENSIVE METABOLIC PANEL
ALT: 16 IU/L (ref 0–44)
AST: 14 IU/L (ref 0–40)
Albumin/Globulin Ratio: 1.7 (ref 1.2–2.2)
Albumin: 4.3 g/dL (ref 3.8–4.9)
Alkaline Phosphatase: 88 IU/L (ref 44–121)
BUN/Creatinine Ratio: 12 (ref 9–20)
BUN: 14 mg/dL (ref 6–24)
Bilirubin Total: 0.3 mg/dL (ref 0.0–1.2)
CO2: 25 mmol/L (ref 20–29)
Calcium: 9.3 mg/dL (ref 8.7–10.2)
Chloride: 104 mmol/L (ref 96–106)
Creatinine, Ser: 1.14 mg/dL (ref 0.76–1.27)
GFR calc Af Amer: 85 mL/min/{1.73_m2} (ref >59)
GFR calc non Af Amer: 74 mL/min/{1.73_m2} (ref >59)
Globulin, Total: 2.6 g/dL (ref 1.5–4.5)
Glucose: 149 mg/dL — ABNORMAL HIGH (ref 65–99)
Potassium: 4.4 mmol/L (ref 3.5–5.2)
Sodium: 141 mmol/L (ref 134–144)
Total Protein: 6.9 g/dL (ref 6.0–8.5)

## 2020-09-05 LAB — LIPID PANEL
Chol/HDL Ratio: 3.2 ratio (ref 0.0–5.0)
Cholesterol, Total: 110 mg/dL (ref 100–199)
HDL: 34 mg/dL — ABNORMAL LOW (ref >39)
LDL Chol Calc (NIH): 59 mg/dL (ref 0–99)
Triglycerides: 88 mg/dL (ref 0–149)
VLDL Cholesterol Cal: 17 mg/dL (ref 5–40)

## 2020-09-06 ENCOUNTER — Encounter: Payer: Self-pay | Admitting: *Deleted

## 2020-10-14 ENCOUNTER — Other Ambulatory Visit: Payer: Self-pay | Admitting: Internal Medicine

## 2020-10-15 LAB — CBC
HCT: 42.8 % (ref 38.5–50.0)
Hemoglobin: 14.4 g/dL (ref 13.2–17.1)
MCH: 28.1 pg (ref 27.0–33.0)
MCHC: 33.6 g/dL (ref 32.0–36.0)
MCV: 83.4 fL (ref 80.0–100.0)
MPV: 11.2 fL (ref 7.5–12.5)
Platelets: 257 10*3/uL (ref 140–400)
RBC: 5.13 10*6/uL (ref 4.20–5.80)
RDW: 14 % (ref 11.0–15.0)
WBC: 5.3 10*3/uL (ref 3.8–10.8)

## 2020-10-15 LAB — COMPLETE METABOLIC PANEL WITH GFR
AG Ratio: 1.7 (calc) (ref 1.0–2.5)
ALT: 18 U/L (ref 9–46)
AST: 15 U/L (ref 10–35)
Albumin: 4.3 g/dL (ref 3.6–5.1)
Alkaline phosphatase (APISO): 76 U/L (ref 35–144)
BUN: 13 mg/dL (ref 7–25)
CO2: 26 mmol/L (ref 20–32)
Calcium: 9.2 mg/dL (ref 8.6–10.3)
Chloride: 101 mmol/L (ref 98–110)
Creat: 1.06 mg/dL (ref 0.70–1.33)
GFR, Est African American: 93 mL/min/{1.73_m2} (ref >=60)
GFR, Est Non African American: 80 mL/min/{1.73_m2} (ref >=60)
Globulin: 2.5 g/dL (calc) (ref 1.9–3.7)
Glucose, Bld: 155 mg/dL — ABNORMAL HIGH (ref 65–99)
Potassium: 4.5 mmol/L (ref 3.5–5.3)
Sodium: 138 mmol/L (ref 135–146)
Total Bilirubin: 0.4 mg/dL (ref 0.2–1.2)
Total Protein: 6.8 g/dL (ref 6.1–8.1)

## 2020-10-15 LAB — VITAMIN D 25 HYDROXY (VIT D DEFICIENCY, FRACTURES): Vit D, 25-Hydroxy: 50 ng/mL (ref 30–100)

## 2020-10-15 LAB — PSA: PSA: 0.52 ng/mL (ref <=4.0)

## 2020-10-15 LAB — TSH: TSH: 1.48 mIU/L (ref 0.40–4.50)

## 2020-10-27 ENCOUNTER — Other Ambulatory Visit: Payer: Self-pay | Admitting: Cardiology

## 2021-01-05 ENCOUNTER — Other Ambulatory Visit: Payer: Self-pay | Admitting: Cardiology

## 2021-01-06 ENCOUNTER — Other Ambulatory Visit: Payer: Self-pay | Admitting: Cardiology

## 2021-01-28 IMAGING — DX DG CHEST 1V PORT
1 series · 1 of 1 positions shown · non-contrast
Comparison: 11/03/2019

CLINICAL DATA: Chest tube, status post CABG

EXAM:
PORTABLE CHEST 1 VIEW

[chest ap]
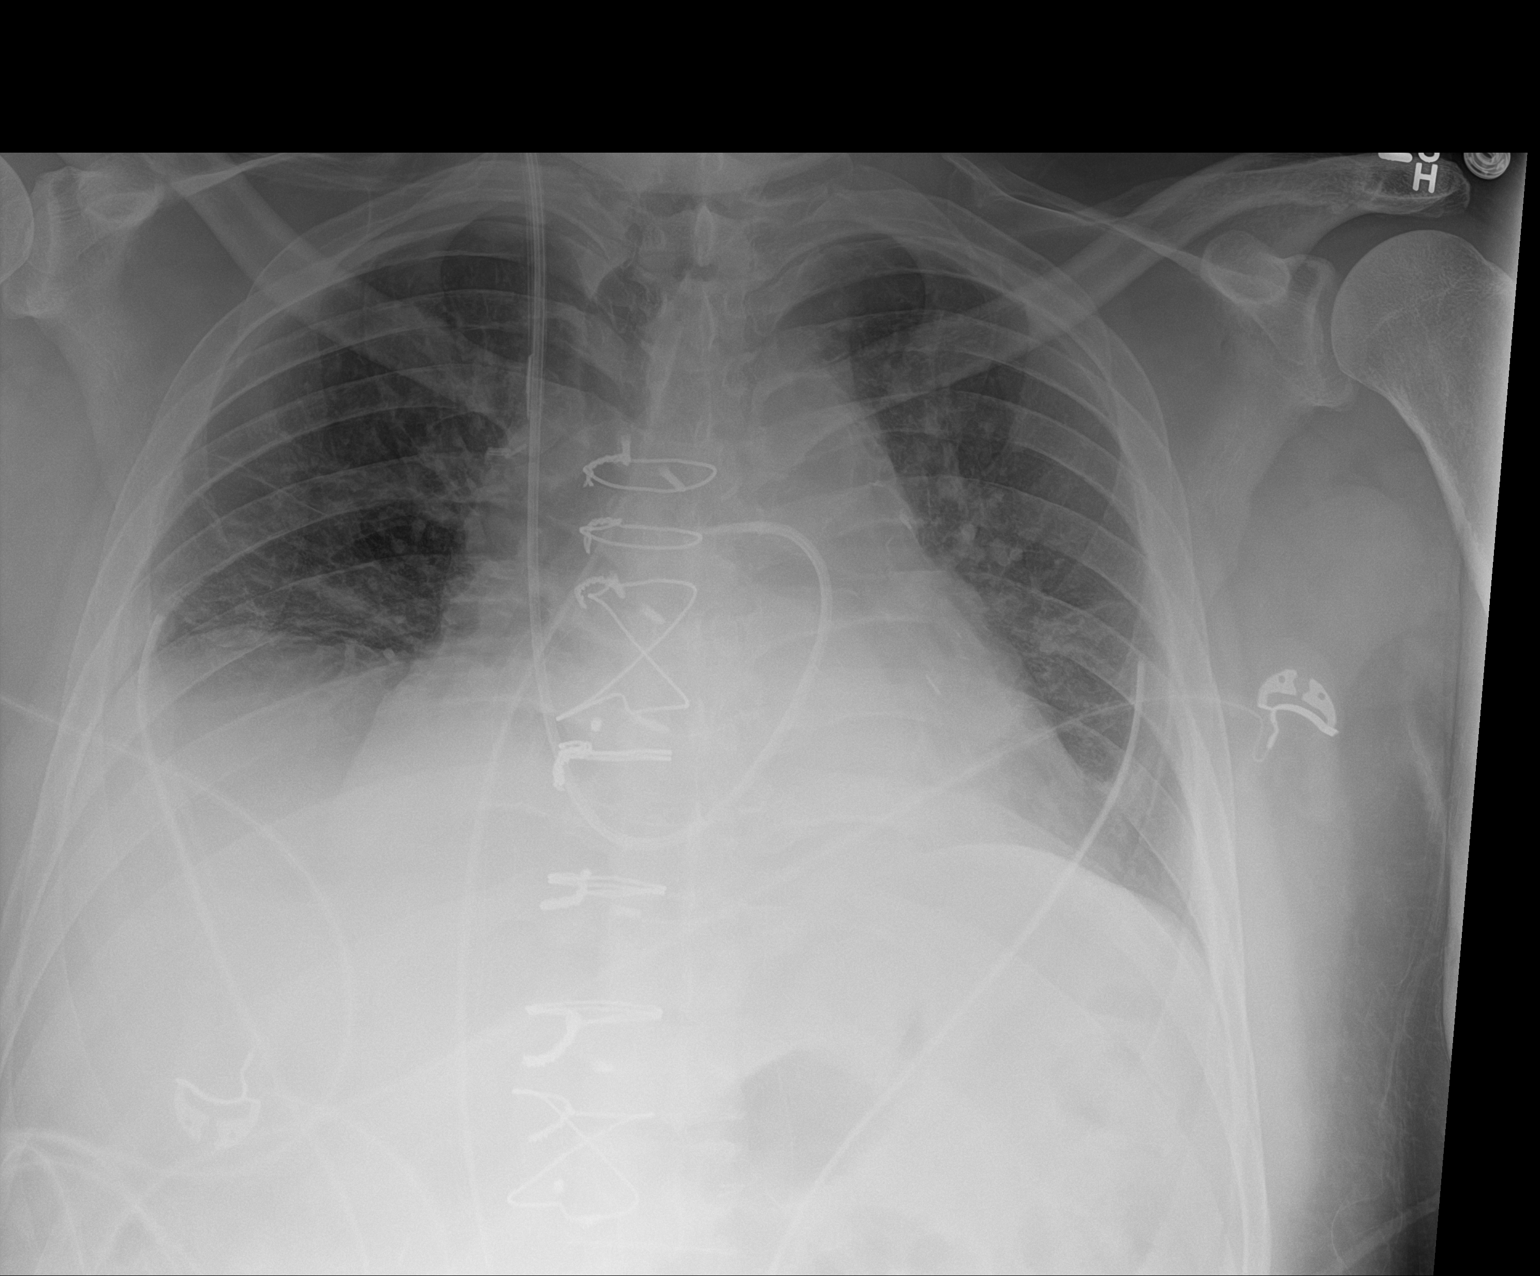

[1 of 1 positions shown; findings below may reference images not displayed]

FINDINGS: Interval endotracheal extubation. Bilateral chest tubes remain in
position. No significant pneumothorax appreciated. Right neck
pulmonary vascular catheter remains in position, tip directed over
the right pulmonary artery. Mild cardiomegaly status post median
sternotomy.
IMPRESSION: 1.  Interval extubation.

2. Bilateral chest tubes remain in position. No significant
pneumothorax.

3.  No acute appearing airspace opacity.

## 2021-01-29 IMAGING — DX DG CHEST 1V
1 series · 1 of 1 positions shown · non-contrast
Comparison: Yesterday

CLINICAL DATA: Open-heart surgery 11/03/2019

EXAM:
CHEST  1 VIEW

[chest]
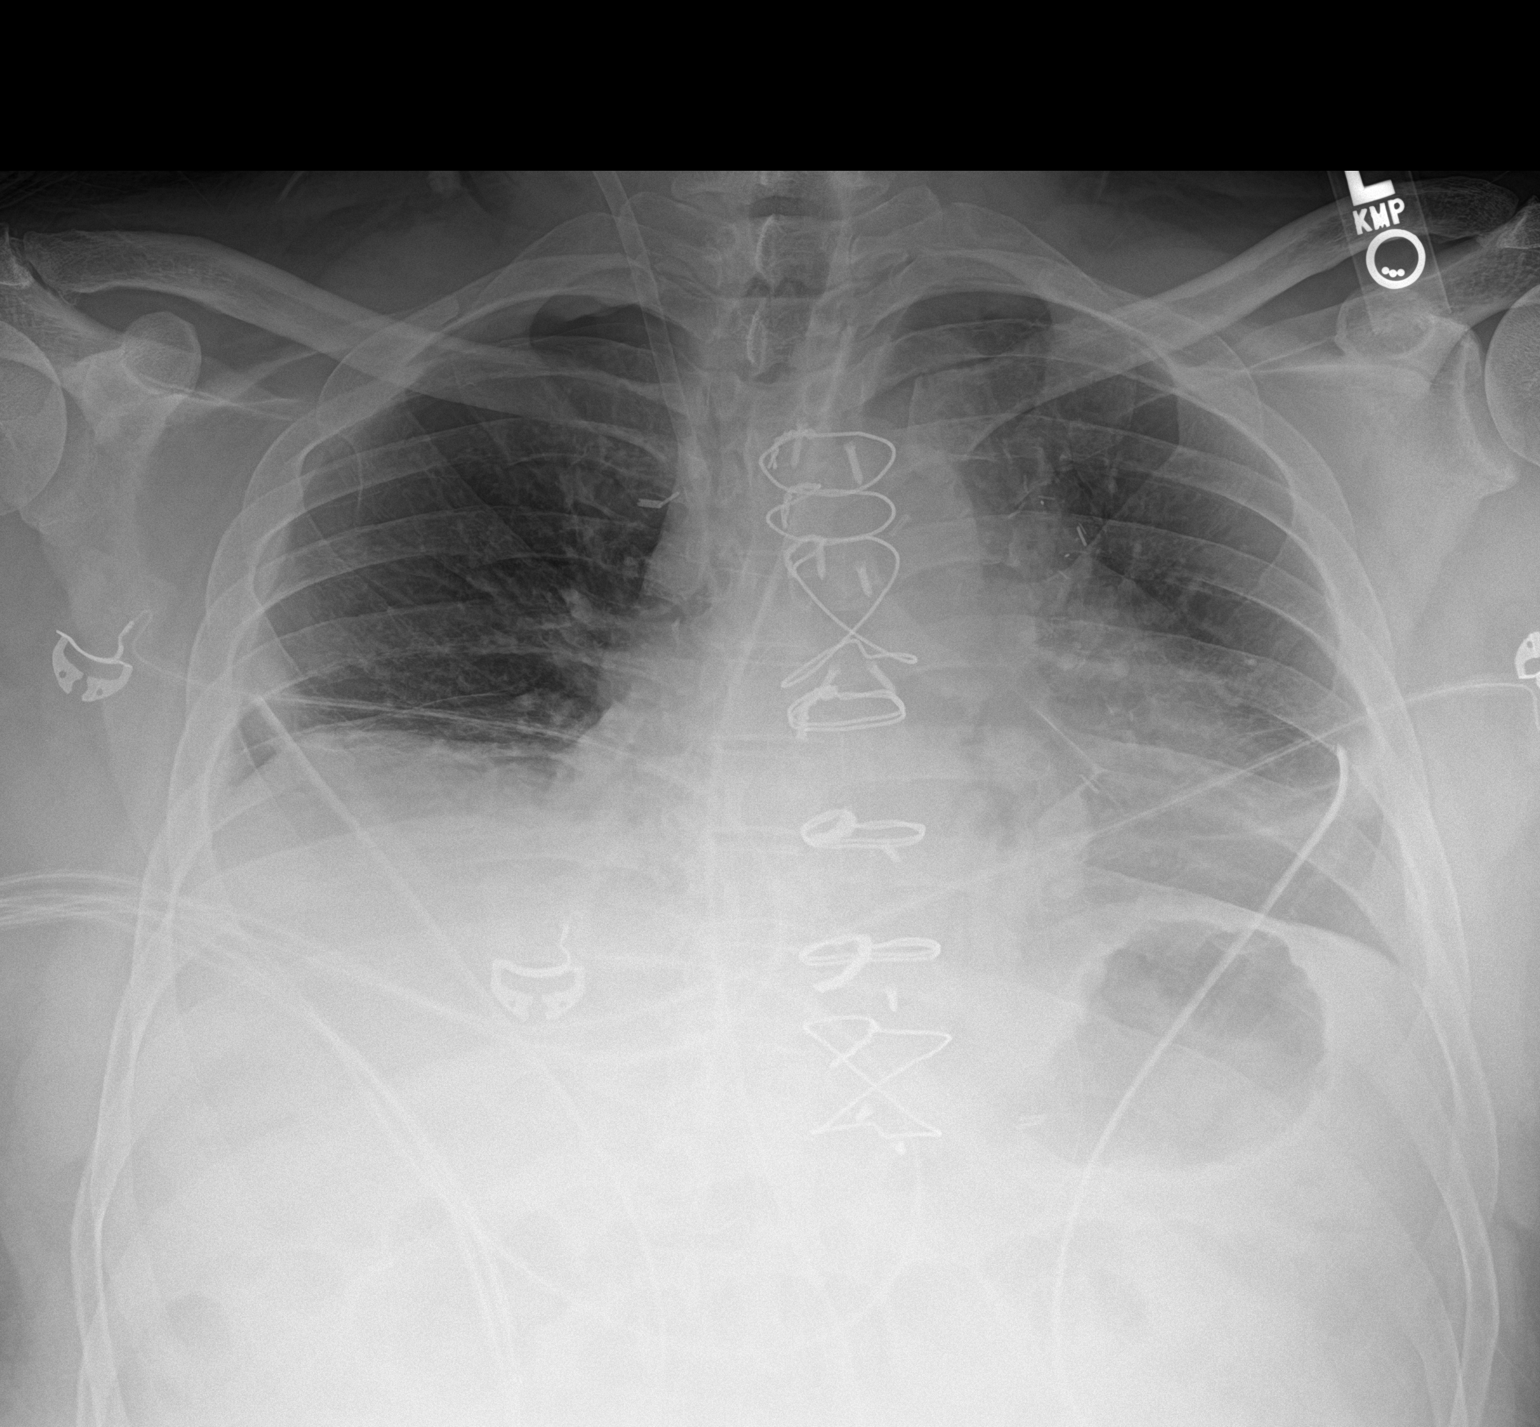

[1 of 1 positions shown; findings below may reference images not displayed]

FINDINGS: Low volume chest with atelectasis at the bases. Cardiomegaly that is
stable. Chest tubes in place. Stable sheath positioning after
Swan-Ganz catheter removal. Small right apical pneumothorax, 2 rib
interspaces in height.
IMPRESSION: 1. Interval small right apical pneumothorax.
2. Stable low volumes and atelectasis.

## 2021-01-30 IMAGING — DX DG CHEST 1V PORT
1 series · 1 of 1 positions shown · non-contrast
Comparison: Radiograph 11/05/2019

CLINICAL DATA: Open-heart surgery, history of CAD, diabetes,
hypertension

EXAM:
PORTABLE CHEST 1 VIEW

[chest ap]
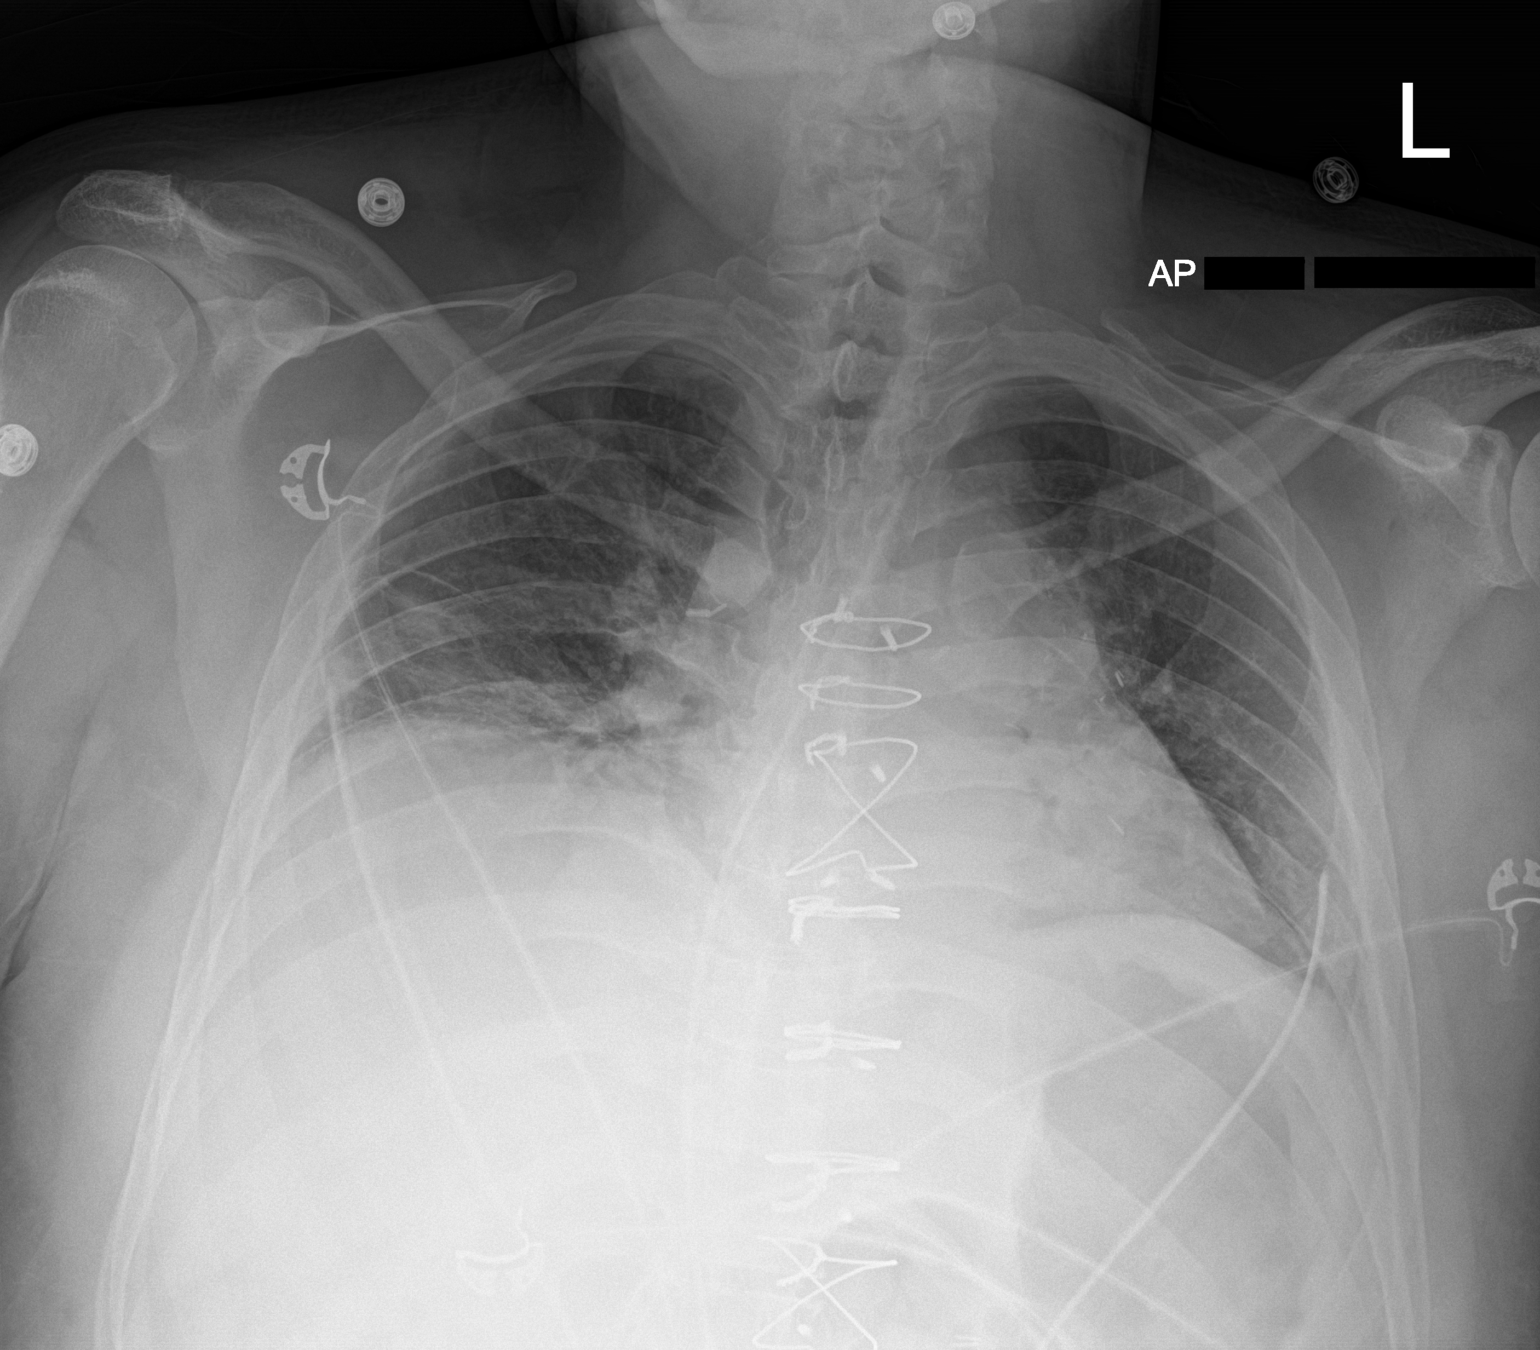

[1 of 1 positions shown; findings below may reference images not displayed]

FINDINGS: Interval removal of a right IJ catheter sheath. Mediastinal and
pleural drains remain in place. Postsurgical changes from prior
sternotomy with multiple CABG surgical clips and stable
postoperative mediastinal widening. Slightly increased bandlike
opacities in the lung bases compatible with atelectasis. No visible
effusion or pneumothorax. No acute osseous or soft tissue
abnormality.
IMPRESSION: Slightly increased bibasilar atelectasis.

Removal of the right IJ catheter sheath.

Otherwise no acute significant oval change.

## 2021-07-11 ENCOUNTER — Other Ambulatory Visit: Payer: Self-pay | Admitting: Cardiology

## 2021-08-16 LAB — COLOGUARD: COLOGUARD: NEGATIVE

## 2021-09-08 ENCOUNTER — Other Ambulatory Visit: Payer: Self-pay | Admitting: Cardiology

## 2021-09-08 DIAGNOSIS — I10 Essential (primary) hypertension: Secondary | ICD-10-CM

## 2021-10-07 ENCOUNTER — Other Ambulatory Visit: Payer: Self-pay | Admitting: Cardiology

## 2021-10-07 DIAGNOSIS — I10 Essential (primary) hypertension: Secondary | ICD-10-CM

## 2021-10-20 ENCOUNTER — Other Ambulatory Visit: Payer: Self-pay | Admitting: Internal Medicine

## 2021-10-21 LAB — COMPLETE METABOLIC PANEL WITH GFR
AG Ratio: 1.6 (calc) (ref 1.0–2.5)
ALT: 14 U/L (ref 9–46)
AST: 13 U/L (ref 10–35)
Albumin: 4.2 g/dL (ref 3.6–5.1)
Alkaline phosphatase (APISO): 66 U/L (ref 35–144)
BUN: 14 mg/dL (ref 7–25)
CO2: 25 mmol/L (ref 20–32)
Calcium: 9.2 mg/dL (ref 8.6–10.3)
Chloride: 102 mmol/L (ref 98–110)
Creat: 1.12 mg/dL (ref 0.70–1.30)
Globulin: 2.6 g/dL (calc) (ref 1.9–3.7)
Glucose, Bld: 134 mg/dL — ABNORMAL HIGH (ref 65–99)
Potassium: 5.2 mmol/L (ref 3.5–5.3)
Sodium: 138 mmol/L (ref 135–146)
Total Bilirubin: 0.4 mg/dL (ref 0.2–1.2)
Total Protein: 6.8 g/dL (ref 6.1–8.1)
eGFR: 79 mL/min/{1.73_m2} (ref >=60)

## 2021-10-21 LAB — VITAMIN D 25 HYDROXY (VIT D DEFICIENCY, FRACTURES): Vit D, 25-Hydroxy: 53 ng/mL (ref 30–100)

## 2021-10-21 LAB — PSA: PSA: 0.38 ng/mL (ref <=4.00)

## 2021-10-21 LAB — CBC
HCT: 42.6 % (ref 38.5–50.0)
Hemoglobin: 13.8 g/dL (ref 13.2–17.1)
MCH: 26.8 pg — ABNORMAL LOW (ref 27.0–33.0)
MCHC: 32.4 g/dL (ref 32.0–36.0)
MCV: 82.9 fL (ref 80.0–100.0)
MPV: 11 fL (ref 7.5–12.5)
Platelets: 280 10*3/uL (ref 140–400)
RBC: 5.14 10*6/uL (ref 4.20–5.80)
RDW: 14.2 % (ref 11.0–15.0)
WBC: 4.9 10*3/uL (ref 3.8–10.8)

## 2021-10-21 LAB — LIPID PANEL
Cholesterol: 130 mg/dL (ref <200)
HDL: 40 mg/dL (ref >=40)
LDL Cholesterol (Calc): 73 mg/dL (calc)
Non-HDL Cholesterol (Calc): 90 mg/dL (calc) (ref <130)
Total CHOL/HDL Ratio: 3.3 (calc) (ref <5.0)
Triglycerides: 83 mg/dL (ref <150)

## 2021-10-21 LAB — TSH: TSH: 1.06 mIU/L (ref 0.40–4.50)

## 2021-11-15 ENCOUNTER — Other Ambulatory Visit: Payer: Self-pay | Admitting: Cardiology

## 2021-11-22 ENCOUNTER — Other Ambulatory Visit: Payer: Self-pay | Admitting: Cardiology

## 2021-11-22 DIAGNOSIS — I10 Essential (primary) hypertension: Secondary | ICD-10-CM

## 2021-12-11 ENCOUNTER — Other Ambulatory Visit: Payer: Self-pay | Admitting: Cardiology

## 2021-12-11 DIAGNOSIS — I10 Essential (primary) hypertension: Secondary | ICD-10-CM

## 2021-12-16 ENCOUNTER — Other Ambulatory Visit: Payer: Self-pay | Admitting: Cardiology

## 2021-12-19 ENCOUNTER — Other Ambulatory Visit: Payer: Self-pay | Admitting: Cardiology

## 2021-12-19 DIAGNOSIS — I10 Essential (primary) hypertension: Secondary | ICD-10-CM

## 2022-01-14 ENCOUNTER — Other Ambulatory Visit: Payer: Self-pay | Admitting: Cardiology

## 2022-03-12 ENCOUNTER — Other Ambulatory Visit: Payer: Self-pay | Admitting: Cardiology

## 2022-05-09 NOTE — Progress Notes (Signed)
HPI: FU coronary artery disease. Patient suffered a myocardial infarction in May of 2011. Ultimately had PCI of RCA. Monitor 10/17 showed sinus to sinus tach. Cath 2/21 showed severe 3 vessel CAD. Echo 2/21 showed normal LV function. Carotid dopplers showed 1-39 bilateral stenosis. Had CABG 2/21 with LIMA to LAD, radial to PDA, free RIMA to OM1 and OM2, SVG to apical portion of LAD and D1. Since I last saw him, the patient denies any dyspnea on exertion, orthopnea, PND, pedal edema, palpitations, syncope or chest pain.   Current Outpatient Medications  Medication Sig Dispense Refill   aspirin EC 81 MG tablet Take 81 mg by mouth daily.     clopidogrel (PLAVIX) 75 MG tablet TAKE 1 TABLET BY MOUTH EVERY DAY 30 tablet 5   ezetimibe (ZETIA) 10 MG tablet TAKE 1 TABLET BY MOUTH EVERY DAY (Patient taking differently: Take 10 mg by mouth daily.) 30 tablet 6   hydrochlorothiazide (HYDRODIURIL) 25 MG tablet Take 25 mg by mouth daily.     JARDIANCE 25 MG TABS tablet Take 25 mg by mouth daily.  5   lisinopril (ZESTRIL) 40 MG tablet Take 1 tablet (40 mg total) by mouth daily. Please schedule appt for future refills. 3rd & Final attempt 15 tablet 0   metFORMIN (GLUCOPHAGE) 1000 MG tablet Take 1,000 mg by mouth 2 (two) times daily.  5   metoprolol succinate (TOPROL-XL) 25 MG 24 hr tablet Take 25 mg by mouth daily.     pioglitazone (ACTOS) 30 MG tablet Take 30 mg by mouth daily.  5   rosuvastatin (CRESTOR) 40 MG tablet Take 1 tablet (40 mg total) by mouth daily. 90 tablet 0   Vitamin D, Ergocalciferol, (DRISDOL) 50000 units CAPS capsule Take 50,000 Units by mouth once a week. Sundays  5   No current facility-administered medications for this visit.     Past Medical History:  Diagnosis Date   CAD (coronary artery disease)    a. 01/2010 - MI with unsuccessful PCI of RCA. b. s/p PCI/DES to mid RCA 05/2012. c. Cath 11/2012 for abnl stress test - diffuse branch and distal vessel CAD for med rx.     Diabetes  mellitus (HCC)    a. A1c 6.9 in 11/2012.   Hyperlipidemia    Hypertension    OSA on CPAP    Paresthesias    a. bilat upper & lower extremities - intermittently present since 2011.    Past Surgical History:  Procedure Laterality Date   CORONARY ANGIOPLASTY WITH STENT PLACEMENT  05/14/2012   "1"   CORONARY ARTERY BYPASS GRAFT N/A 11/03/2019   Procedure: CORONARY ARTERY BYPASS GRAFTING (CABG) using LIMA to LAD(m); Free RIMA to OM1 and OM2; Left radial artery to PDA; Right Greater Saphenous vein endoscopic harvested: SVG to Diag1; SVG to distal LAD.;  Surgeon: Linden Dolin, MD;  Location: [facility] OR;  Service: Open Heart Surgery;  Laterality: N/A;  Left UE (arm) open radial artery harvest; Right LE  (leg) endoscopic harvest.   ENDOVEIN HARVEST OF GREATER SAPHENOUS VEIN Right 11/03/2019   Procedure: Mack Guise Of Greater Saphenous Vein of right LE.;  Surgeon: Linden Dolin, MD;  Location: [facility] OR;  Service: Open Heart Surgery;  Laterality: Right;   LEFT HEART CATH AND CORONARY ANGIOGRAPHY N/A 11/02/2019   Procedure: LEFT HEART CATH AND CORONARY ANGIOGRAPHY;  Surgeon: Kathleene Hazel, MD;  Location: [facility] INVASIVE CV LAB;  Service: Cardiovascular;  Laterality: N/A;   LEFT HEART CATHETERIZATION WITH  CORONARY ANGIOGRAM N/A 11/25/2012   Procedure: LEFT HEART CATHETERIZATION WITH CORONARY ANGIOGRAM;  Surgeon: Laurey Morale, MD;  Location[facility] CATH LAB;  Service: Cardiovascular;  Laterality: N/A;   PATELLAR TENDON REPAIR  1990's   left   PERCUTANEOUS CORONARY STENT INTERVENTION (PCI-S) N/A 05/14/2012   Procedure: PERCUTANEOUS CORONARY STENT INTERVENTION (PCI-S);  Surgeon: Kathleene Hazel, MD;  Location: [facility] CATH LAB;  Service: Cardiovascular;  Laterality: N/A;   RADIAL ARTERY HARVEST Left 11/03/2019   Procedure: RADIAL ARTERY HARVEST;  Surgeon: Linden Dolin, MD;  Location: [facility] OR;  Service: Open Heart Surgery;  Laterality: Left;   TEE WITHOUT CARDIOVERSION N/A 11/03/2019   Procedure:  TRANSESOPHAGEAL ECHOCARDIOGRAM (TEE);  Surgeon: Linden Dolin, MD;  Location: [facility] OR;  Service: Open Heart Surgery;  Laterality: N/A;    Social History   Socioeconomic History   Marital status: Divorced    Spouse name: Not on file   Number of children: 2   Years of education: 12   Highest education level: High school graduate  Occupational History    Employer: UNEMPLOYED  Tobacco Use   Smoking status: Former    Packs/day: 0.50    Years: 18.00    Total pack years: 9.00    Types: Cigarettes    Quit date: 11/26/2003    Years since quitting: 18.4   Smokeless tobacco: Never  Vaping Use   Vaping Use: Never used  Substance and Sexual Activity   Alcohol use: No    Alcohol/week: 0.0 standard drinks of alcohol   Drug use: No   Sexual activity: Yes  Other Topics Concern   Not on file  Social History Narrative   Not on file   Social Determinants of Health   Financial Resource Strain: Not on file  Food Insecurity: Not on file  Transportation Needs: Not on file  Physical Activity: Not on file  Stress: Not on file  Social Connections: Not on file  Intimate Partner Violence: Not on file    Family History  Problem Relation Age of Onset   Hypertension Mother    Cancer Mother    Diabetes Mother    Stroke Mother    Cancer Father     ROS: no fevers or chills, productive cough, hemoptysis, dysphasia, odynophagia, melena, hematochezia, dysuria, hematuria, rash, seizure activity, orthopnea, PND, pedal edema, claudication. Remaining systems are negative.  Physical Exam: Well-developed well-nourished in no acute distress.  Skin is warm and dry.  HEENT is normal.  Neck is supple.  Chest is clear to auscultation with normal expansion.  Cardiovascular exam is regular rate and rhythm.  Abdominal exam nontender or distended. No masses palpated. Extremities show no edema. neuro grossly intact  ECG-normal sinus rhythm at a rate of 61, left ventricular hypertrophy, inferior lateral  infarct.  Personally reviewed  A/P  1 coronary artery disease-continue aspirin and statin.  2 hypertension-patient's blood pressure is elevated; add amlodipine 5 mg daily and follow.  Check potassium and renal function.  3 hyperlipidemia-continue Crestor and Zetia.  Check lipids and liver.  If LDL greater than 50 will refer for consideration of Repatha, Praluent or inclisiran.  4 history of palpitations-continue beta-blocker at present dose.  5 history of obstructive sleep apnea-continue CPAP.  Olga Millers, MD

## 2022-05-22 ENCOUNTER — Encounter: Payer: Self-pay | Admitting: Cardiology

## 2022-05-22 ENCOUNTER — Ambulatory Visit: Payer: Medicare Other | Attending: Cardiology | Admitting: Cardiology

## 2022-05-22 VITALS — BP 148/99 | HR 61 | Ht 67.0 in | Wt 211.2 lb

## 2022-05-22 DIAGNOSIS — I10 Essential (primary) hypertension: Secondary | ICD-10-CM

## 2022-05-22 DIAGNOSIS — G4733 Obstructive sleep apnea (adult) (pediatric): Secondary | ICD-10-CM | POA: Diagnosis not present

## 2022-05-22 DIAGNOSIS — Z9989 Dependence on other enabling machines and devices: Secondary | ICD-10-CM | POA: Diagnosis present

## 2022-05-22 DIAGNOSIS — I25119 Atherosclerotic heart disease of native coronary artery with unspecified angina pectoris: Secondary | ICD-10-CM | POA: Diagnosis not present

## 2022-05-22 DIAGNOSIS — E785 Hyperlipidemia, unspecified: Secondary | ICD-10-CM

## 2022-05-22 MED ORDER — AMLODIPINE BESYLATE 5 MG PO TABS: 5.0000 mg | ORAL_TABLET | Freq: Every day | ORAL | 3 refills | Status: DC

## 2022-05-22 NOTE — Addendum Note (Signed)
Addended by: Ardith Dark on: 05/22/2022 12:46 PM   Modules accepted: Orders

## 2022-05-22 NOTE — Patient Instructions (Signed)
Medication Instructions:   START AMLODIPINE 5 MG ONCE DAILY  *If you need a refill on your cardiac medications before your next appointment, please call your pharmacy*   Follow-Up: At [facility], you and your health needs are our priority.  As part of our continuing mission to provide you with exceptional heart care, we have created designated Provider Care Teams.  These Care Teams include your primary Cardiologist (physician) and Advanced Practice Providers (APPs -  Physician Assistants and Nurse Practitioners) who all work together to provide you with the care you need, when you need it.  We recommend signing up for the patient portal called "MyChart".  Sign up information is provided on this After Visit Summary.  MyChart is used to connect with patients for Virtual Visits (Telemedicine).  Patients are able to view lab/test results, encounter notes, upcoming appointments, etc.  Non-urgent messages can be sent to your provider as well.   To learn more about what you can do with MyChart, go to [email or web address].    Your next appointment:   12 month(s)  The format for your next appointment:   In Person  Provider:   Olga Millers, MD

## 2022-05-22 NOTE — Addendum Note (Signed)
Addended by: Ardith Dark on: 05/22/2022 11:50 AM   Modules accepted: Orders

## 2022-05-23 ENCOUNTER — Encounter: Payer: Self-pay | Admitting: *Deleted

## 2022-05-23 LAB — COMPREHENSIVE METABOLIC PANEL
ALT: 18 IU/L (ref 0–44)
AST: 17 IU/L (ref 0–40)
Albumin/Globulin Ratio: 1.8 (ref 1.2–2.2)
Albumin: 4.5 g/dL (ref 3.8–4.9)
Alkaline Phosphatase: 76 IU/L (ref 44–121)
BUN/Creatinine Ratio: 13 (ref 9–20)
BUN: 15 mg/dL (ref 6–24)
Bilirubin Total: 0.3 mg/dL (ref 0.0–1.2)
CO2: 25 mmol/L (ref 20–29)
Calcium: 9.4 mg/dL (ref 8.7–10.2)
Chloride: 101 mmol/L (ref 96–106)
Creatinine, Ser: 1.16 mg/dL (ref 0.76–1.27)
Globulin, Total: 2.5 g/dL (ref 1.5–4.5)
Glucose: 133 mg/dL — ABNORMAL HIGH (ref 70–99)
Potassium: 4.4 mmol/L (ref 3.5–5.2)
Sodium: 141 mmol/L (ref 134–144)
Total Protein: 7 g/dL (ref 6.0–8.5)
eGFR: 75 mL/min/{1.73_m2} (ref >59)

## 2022-05-23 LAB — LIPID PANEL
Chol/HDL Ratio: 3.3 ratio (ref 0.0–5.0)
Cholesterol, Total: 109 mg/dL (ref 100–199)
HDL: 33 mg/dL — ABNORMAL LOW (ref >39)
LDL Chol Calc (NIH): 58 mg/dL (ref 0–99)
Triglycerides: 95 mg/dL (ref 0–149)
VLDL Cholesterol Cal: 18 mg/dL (ref 5–40)

## 2022-06-13 ENCOUNTER — Other Ambulatory Visit: Payer: Self-pay | Admitting: Cardiology

## 2022-07-02 ENCOUNTER — Other Ambulatory Visit: Payer: Self-pay | Admitting: Cardiology

## 2023-05-08 NOTE — Progress Notes (Addendum)
HPI: FU coronary artery disease. Patient suffered a myocardial infarction in May of 2011. Ultimately had PCI of RCA. Monitor 10/17 showed sinus to sinus tach. Cath 2/21 showed severe 3 vessel CAD. Echo 2/21 showed normal LV function. Carotid dopplers showed 1-39 bilateral stenosis. Had CABG 2/21 with LIMA to LAD, radial to PDA, free RIMA to OM1 and OM2, SVG to apical portion of LAD and D1. Since I last saw him, patient denies increased dyspnea, palpitations or syncope.  Rare chest pain that has been chronic and unchanged.  Current Outpatient Medications  Medication Sig Dispense Refill   amLODipine (NORVASC) 5 MG tablet Take 1 tablet (5 mg total) by mouth daily. 90 tablet 3   aspirin EC 81 MG tablet Take 81 mg by mouth daily.     clopidogrel (PLAVIX) 75 MG tablet Take 1 tablet (75 mg total) by mouth daily. 60 tablet 5   ezetimibe (ZETIA) 10 MG tablet TAKE 1 TABLET BY MOUTH EVERY DAY (Patient taking differently: Take 10 mg by mouth daily.) 30 tablet 6   hydrochlorothiazide (HYDRODIURIL) 25 MG tablet Take 25 mg by mouth daily.     JARDIANCE 25 MG TABS tablet Take 25 mg by mouth daily.  5   lisinopril (ZESTRIL) 40 MG tablet Take 1 tablet (40 mg total) by mouth daily. Please schedule appt for future refills. 3rd & Final attempt 15 tablet 0   metFORMIN (GLUCOPHAGE) 1000 MG tablet Take 1,000 mg by mouth 2 (two) times daily.  5   metoprolol succinate (TOPROL-XL) 25 MG 24 hr tablet Take 25 mg by mouth daily.     pioglitazone (ACTOS) 30 MG tablet Take 30 mg by mouth daily.  5   rosuvastatin (CRESTOR) 40 MG tablet TAKE 1 TABLET BY MOUTH EVERY DAY 90 tablet 3   Vitamin D, Ergocalciferol, (DRISDOL) 50000 units CAPS capsule Take 50,000 Units by mouth once a week. Sundays  5   No current facility-administered medications for this visit.     Past Medical History:  Diagnosis Date   CAD (coronary artery disease)    a. 01/2010 - MI with unsuccessful PCI of RCA. b. s/p PCI/DES to mid RCA 05/2012. c. Cath  11/2012 for abnl stress test - diffuse branch and distal vessel CAD for med rx.     Diabetes mellitus (HCC)    a. A1c 6.9 in 11/2012.   Hyperlipidemia    Hypertension    OSA on CPAP    Paresthesias    a. bilat upper & lower extremities - intermittently present since 2011.    Past Surgical History:  Procedure Laterality Date   CORONARY ANGIOPLASTY WITH STENT PLACEMENT  05/14/2012   "1"   CORONARY ARTERY BYPASS GRAFT N/A 11/03/2019   Procedure: CORONARY ARTERY BYPASS GRAFTING (CABG) using LIMA to LAD(m); Free RIMA to OM1 and OM2; Left radial artery to PDA; Right Greater Saphenous vein endoscopic harvested: SVG to Diag1; SVG to distal LAD.;  Surgeon: Linden Dolin, MD;  Location: [facility] OR;  Service: Open Heart Surgery;  Laterality: N/A;  Left UE (arm) open radial artery harvest; Right LE  (leg) endoscopic harvest.   ENDOVEIN HARVEST OF GREATER SAPHENOUS VEIN Right 11/03/2019   Procedure: Mack Guise Of Greater Saphenous Vein of right LE.;  Surgeon: Linden Dolin, MD;  Location: [facility] OR;  Service: Open Heart Surgery;  Laterality: Right;   LEFT HEART CATH AND CORONARY ANGIOGRAPHY N/A 11/02/2019   Procedure: LEFT HEART CATH AND CORONARY ANGIOGRAPHY;  Surgeon: Kathleene Hazel,  MD;  Location: [facility] INVASIVE CV LAB;  Service: Cardiovascular;  Laterality: N/A;   LEFT HEART CATHETERIZATION WITH CORONARY ANGIOGRAM N/A 11/25/2012   Procedure: LEFT HEART CATHETERIZATION WITH CORONARY ANGIOGRAM;  Surgeon: Laurey Morale, MD;  Location: [facility] CATH LAB;  Service: Cardiovascular;  Laterality: N/A;   PATELLAR TENDON REPAIR  1990's   left   PERCUTANEOUS CORONARY STENT INTERVENTION (PCI-S) N/A 05/14/2012   Procedure: PERCUTANEOUS CORONARY STENT INTERVENTION (PCI-S);  Surgeon: Kathleene Hazel, MD;  Location: [facility] CATH LAB;  Service: Cardiovascular;  Laterality: N/A;   RADIAL ARTERY HARVEST Left 11/03/2019   Procedure: RADIAL ARTERY HARVEST;  Surgeon: Linden Dolin, MD;  Location: [facility] OR;  Service: Open  Heart Surgery;  Laterality: Left;   TEE WITHOUT CARDIOVERSION N/A 11/03/2019   Procedure: TRANSESOPHAGEAL ECHOCARDIOGRAM (TEE);  Surgeon: Linden Dolin, MD;  Location: [facility] OR;  Service: Open Heart Surgery;  Laterality: N/A;    Social History   Socioeconomic History   Marital status: Divorced    Spouse name: Not on file   Number of children: 2   Years of education: 12   Highest education level: High school graduate  Occupational History    Employer: UNEMPLOYED  Tobacco Use   Smoking status: Former    Current packs/day: 0.00    Average packs/day: 0.5 packs/day for 18.0 years (9.0 ttl pk-yrs)    Types: Cigarettes    Start date: 11/25/1985    Quit date: 11/26/2003    Years since quitting: 19.4   Smokeless tobacco: Never  Vaping Use   Vaping status: Never Used  Substance and Sexual Activity   Alcohol use: No    Alcohol/week: 0.0 standard drinks of alcohol   Drug use: No   Sexual activity: Yes  Other Topics Concern   Not on file  Social History Narrative   Not on file   Social Determinants of Health   Financial Resource Strain: Not on file  Food Insecurity: Not on file  Transportation Needs: Not on file  Physical Activity: Not on file  Stress: Not on file  Social Connections: Not on file  Intimate Partner Violence: Not on file    Family History  Problem Relation Age of Onset   Hypertension Mother    Cancer Mother    Diabetes Mother    Stroke Mother    Cancer Father     ROS: no fevers or chills, productive cough, hemoptysis, dysphasia, odynophagia, melena, hematochezia, dysuria, hematuria, rash, seizure activity, orthopnea, PND, pedal edema, claudication. Remaining systems are negative.  Physical Exam: Well-developed well-nourished in no acute distress.  Skin is warm and dry.  HEENT is normal.  Neck is supple.  Chest is clear to auscultation with normal expansion.  Cardiovascular exam is regular rate and rhythm.  Abdominal exam nontender or distended. No  masses palpated. Extremities show no edema. neuro grossly intact  ECG-normal sinus rhythm, inferior infarct, left ventricular hypertrophy.  Personally reviewed  A/P  1 coronary artery disease-patient denies chest pain at present.  Continue aspirin and statin.  Discontinue Plavix.  2 hypertension-blood pressure is borderline.  However he states it is controlled at home.  He will follow and we will advance regimen if needed.  Check potassium and renal function.  3 hyperlipidemia-continue Crestor and Zetia.  Check lipids and liver.  4 obstructive sleep apnea-continue CPAP.  5 palpitations-symptoms are controlled.  Continue beta-blocker.  Olga Millers, MD

## 2023-05-17 ENCOUNTER — Ambulatory Visit: Payer: Medicare Other | Attending: Cardiology | Admitting: Cardiology

## 2023-05-17 ENCOUNTER — Encounter: Payer: Self-pay | Admitting: Cardiology

## 2023-05-17 VITALS — BP 144/82 | HR 67 | Ht 67.0 in | Wt 213.6 lb

## 2023-05-17 DIAGNOSIS — G4733 Obstructive sleep apnea (adult) (pediatric): Secondary | ICD-10-CM | POA: Insufficient documentation

## 2023-05-17 DIAGNOSIS — E785 Hyperlipidemia, unspecified: Secondary | ICD-10-CM | POA: Diagnosis present

## 2023-05-17 DIAGNOSIS — I25119 Atherosclerotic heart disease of native coronary artery with unspecified angina pectoris: Secondary | ICD-10-CM | POA: Diagnosis present

## 2023-05-17 DIAGNOSIS — I10 Essential (primary) hypertension: Secondary | ICD-10-CM | POA: Diagnosis present

## 2023-05-17 LAB — LIPID PANEL
Chol/HDL Ratio: 2.6 ratio (ref 0.0–5.0)
Cholesterol, Total: 92 mg/dL — ABNORMAL LOW (ref 100–199)
HDL: 36 mg/dL — ABNORMAL LOW (ref >39)
LDL Chol Calc (NIH): 42 mg/dL (ref 0–99)
Triglycerides: 62 mg/dL (ref 0–149)
VLDL Cholesterol Cal: 14 mg/dL (ref 5–40)

## 2023-05-17 LAB — BASIC METABOLIC PANEL
BUN/Creatinine Ratio: 11 (ref 9–20)
BUN: 12 mg/dL (ref 6–24)
CO2: 26 mmol/L (ref 20–29)
Calcium: 8.9 mg/dL (ref 8.7–10.2)
Chloride: 104 mmol/L (ref 96–106)
Creatinine, Ser: 1.13 mg/dL (ref 0.76–1.27)
Glucose: 137 mg/dL — ABNORMAL HIGH (ref 70–99)
Potassium: 4.4 mmol/L (ref 3.5–5.2)
Sodium: 139 mmol/L (ref 134–144)
eGFR: 77 mL/min/{1.73_m2} (ref >59)

## 2023-05-17 LAB — HEPATIC FUNCTION PANEL
ALT: 14 IU/L (ref 0–44)
AST: 14 IU/L (ref 0–40)
Albumin: 4.2 g/dL (ref 3.8–4.9)
Alkaline Phosphatase: 75 IU/L (ref 44–121)
Bilirubin Total: 0.2 mg/dL (ref 0.0–1.2)
Bilirubin, Direct: <0.1 mg/dL (ref 0.00–0.40)
Total Protein: 6.7 g/dL (ref 6.0–8.5)

## 2023-05-17 NOTE — Patient Instructions (Signed)
Medication Instructions:  Stop Plavix   *If you need a refill on your cardiac medications before your next appointment, please call your pharmacy*   Lab Work: LIPIDS, BMP, LFT's - TODAY   If you have labs (blood work) drawn today and your tests are completely normal, you will receive your results only by: MyChart Message (if you have MyChart) OR A paper copy in the mail If you have any lab test that is abnormal or we need to change your treatment, we will call you to review the results.   Testing/Procedures: None ordered    Follow-Up: At [facility], you and your health needs are our priority.  As part of our continuing mission to provide you with exceptional heart care, we have created designated Provider Care Teams.  These Care Teams include your primary Cardiologist (physician) and Advanced Practice Providers (APPs -  Physician Assistants and Nurse Practitioners) who all work together to provide you with the care you need, when you need it.  We recommend signing up for the patient portal called "MyChart".  Sign up information is provided on this After Visit Summary.  MyChart is used to connect with patients for Virtual Visits (Telemedicine).  Patients are able to view lab/test results, encounter notes, upcoming appointments, etc.  Non-urgent messages can be sent to your provider as well.   To learn more about what you can do with MyChart, go to [email or web address].    Your next appointment:   12 month(s)  Provider:   Olga Millers, MD     Other Instructions

## 2023-05-20 ENCOUNTER — Encounter: Payer: Self-pay | Admitting: *Deleted

## 2023-05-31 ENCOUNTER — Other Ambulatory Visit: Payer: Self-pay | Admitting: Cardiology

## 2023-05-31 DIAGNOSIS — I10 Essential (primary) hypertension: Secondary | ICD-10-CM

## 2023-07-11 ENCOUNTER — Other Ambulatory Visit: Payer: Self-pay | Admitting: Cardiology

## 2023-07-12 ENCOUNTER — Other Ambulatory Visit: Payer: Self-pay | Admitting: Cardiology

## 2024-03-12 ENCOUNTER — Encounter: Payer: Self-pay | Admitting: Gastroenterology

## 2024-04-01 ENCOUNTER — Encounter: Payer: Self-pay | Admitting: Gastroenterology

## 2024-04-01 ENCOUNTER — Ambulatory Visit (AMBULATORY_SURGERY_CENTER)

## 2024-04-01 VITALS — Ht 67.0 in | Wt 205.0 lb

## 2024-04-01 DIAGNOSIS — Z1211 Encounter for screening for malignant neoplasm of colon: Secondary | ICD-10-CM

## 2024-04-01 MED ORDER — NA SULFATE-K SULFATE-MG SULF 17.5-3.13-1.6 GM/177ML PO SOLN: 1.0000 | Freq: Once | ORAL | 0 refills | Status: AC

## 2024-04-01 NOTE — Progress Notes (Signed)
 No egg or soy allergy known to patient  No issues known to pt with past sedation with any surgeries or procedures Patient denies ever being told they had issues or difficulty with intubation  No FH of Malignant Hyperthermia Pt is not on diet pills; Pt currently on Mounjaro, hold instructions provided. Pt is not on  home 02  Pt is not on blood thinners  Pt denies issues with constipation  No A fib or A flutter (patient is unsure) Have any cardiac testing pending--No Pt can ambulate  Pt denies use of chewing tobacco Discussed diabetic I weight loss medication holds Discussed NSAID holds Checked BMI Pt instructed to use Singlecare.com or GoodRx for a price reduction on prep  Patient's chart reviewed by Norleen Schillings CNRA prior to previsit and patient appropriate for the [facility].  Pre visit completed and red dot placed by patient's name on their procedure day (on provider's schedule).

## 2024-04-21 ENCOUNTER — Encounter: Payer: Self-pay | Admitting: Gastroenterology

## 2024-04-21 ENCOUNTER — Ambulatory Visit (AMBULATORY_SURGERY_CENTER): Admitting: Gastroenterology

## 2024-04-21 VITALS — BP 110/78 | HR 76 | Temp 97.3°F | Resp 12 | Ht 67.0 in | Wt 205.0 lb

## 2024-04-21 DIAGNOSIS — Q438 Other specified congenital malformations of intestine: Secondary | ICD-10-CM | POA: Diagnosis not present

## 2024-04-21 DIAGNOSIS — K6389 Other specified diseases of intestine: Secondary | ICD-10-CM

## 2024-04-21 DIAGNOSIS — Z1211 Encounter for screening for malignant neoplasm of colon: Secondary | ICD-10-CM

## 2024-04-21 DIAGNOSIS — D122 Benign neoplasm of ascending colon: Secondary | ICD-10-CM

## 2024-04-21 MED ORDER — SODIUM CHLORIDE 0.9 % IV SOLN: 500.0000 mL | Freq: Once | INTRAVENOUS | Status: DC

## 2024-04-21 NOTE — Patient Instructions (Signed)

## 2024-04-21 NOTE — Progress Notes (Signed)
 Called to room to assist during endoscopic procedure.  Patient ID and intended procedure confirmed with present staff. Received instructions for my participation in the procedure from the performing physician.

## 2024-04-21 NOTE — Op Note (Signed)
 [facility] Patient Name: John May Procedure Date: 04/21/2024 11:55 AM MRN: [id number] Endoscopist: Victory L. Legrand , MD, [id number] Age: 58 Referring MD:  Date of Birth: [birth year] Gender: Male Account #: [id number] Procedure:                Colonoscopy Indications:              Screening for colorectal malignant neoplasm, This                            is the patient's first colonoscopy Medicines:                Monitored Anesthesia Care Procedure:                Pre-Anesthesia Assessment:                           - Prior to the procedure, a History and Physical                            was performed, and patient medications and                            allergies were reviewed. The patient's tolerance of                            previous anesthesia was also reviewed. The risks                            and benefits of the procedure and the sedation                            options and risks were discussed with the patient.                            All questions were answered, and informed consent                            was obtained. Prior Anticoagulants: The patient has                            taken no anticoagulant or antiplatelet agents. ASA                            Grade Assessment: III - A patient with severe                            systemic disease. After reviewing the risks and                            benefits, the patient was deemed in satisfactory                            condition to undergo the procedure.  After obtaining informed consent, the colonoscope                            was passed under direct vision. Throughout the                            procedure, the patient's blood pressure, pulse, and                            oxygen saturations were monitored continuously. The                            Olympus Scope SN: [id number] was introduced through                            the anus and advanced to  the the cecum, identified                            by appendiceal orifice and ileocecal valve. The                            colonoscopy was somewhat difficult due to a                            redundant colon. Successful completion of the                            procedure was aided by using manual pressure and                            straightening and shortening the scope to obtain                            bowel loop reduction. The patient tolerated the                            procedure well. The quality of the bowel                            preparation was good. The ileocecal valve,                            appendiceal orifice, and rectum were photographed. Scope In: 11:58:11 AM Scope Out: 12:12:55 PM Scope Withdrawal Time: 0 hours 9 minutes 6 seconds  Total Procedure Duration: 0 hours 14 minutes 44 seconds  Findings:                 The perianal and digital rectal examinations were                            normal.                           Repeat examination of right colon under NBI  performed.                           A diminutive pale polyp was found in the ascending                            colon. The polyp was flat. The polyp was removed                            with a cold snare. Resection and retrieval were                            complete.                           The exam was otherwise without abnormality on                            direct and retroflexion views. Complications:            No immediate complications. Estimated Blood Loss:     Estimated blood loss was minimal. Impression:               - One diminutive polyp in the ascending colon,                            removed with a cold snare. Resected and retrieved.                           - The examination was otherwise normal on direct                            and retroflexion views. Recommendation:           - Patient has a contact number available for                             emergencies. The signs and symptoms of potential                            delayed complications were discussed with the                            patient. Return to normal activities tomorrow.                            Written discharge instructions were provided to the                            patient.                           - Resume previous diet.                           - Continue present medications.                           -  Await pathology results.                           - Repeat colonoscopy is recommended for                            surveillance. The colonoscopy date will be                            determined after pathology results from today's                            exam become available for review. Kasper Mudrick L. Legrand, MD 04/21/2024 12:16:13 PM This report has been signed electronically.

## 2024-04-21 NOTE — Progress Notes (Signed)
 Sedate, gd SR, tolerated procedure well, VSS, report to RN

## 2024-04-21 NOTE — Progress Notes (Signed)
 Pt's states no medical or surgical changes since previsit or office visit.

## 2024-04-21 NOTE — Progress Notes (Signed)
 History and Physical:  This patient presents for endoscopic testing for: Encounter Diagnosis  Name Primary?   Special screening for malignant neoplasms, colon Yes    Average risk for colorectal cancer.  1st screening exam.  Patient denies chronic abdominal pain, rectal bleeding, constipation or diarrhea.  Patient is otherwise without complaints or active issues today.   Past Medical History: Past Medical History:  Diagnosis Date   CAD (coronary artery disease)    a. 01/2010 - MI with unsuccessful PCI of RCA. b. s/p PCI/DES to mid RCA 05/2012. c. Cath 11/2012 for abnl stress test - diffuse branch and distal vessel CAD for med rx.     Diabetes mellitus (HCC)    a. A1c 6.9 in 11/2012.   Hyperlipidemia    Hypertension    Myocardial infarction (HCC)    OSA on CPAP    Paresthesias    a. bilat upper & lower extremities - intermittently present since 2011.   Sleep apnea      Past Surgical History: Past Surgical History:  Procedure Laterality Date   CORONARY ANGIOPLASTY WITH STENT PLACEMENT  05/14/2012   1   CORONARY ARTERY BYPASS GRAFT N/A 11/03/2019   Procedure: CORONARY ARTERY BYPASS GRAFTING (CABG) using LIMA to LAD(m); Free RIMA to OM1 and OM2; Left radial artery to PDA; Right Greater Saphenous vein endoscopic harvested: SVG to Diag1; SVG to distal LAD.;  Surgeon: German Bartlett PEDLAR, MD;  Location: [facility] OR;  Service: Open Heart Surgery;  Laterality: N/A;  Left UE (arm) open radial artery harvest; Right LE  (leg) endoscopic harvest.   ENDOVEIN HARVEST OF GREATER SAPHENOUS VEIN Right 11/03/2019   Procedure: Jethro Rubins Of Greater Saphenous Vein of right LE.;  Surgeon: German Bartlett PEDLAR, MD;  Location: [facility] OR;  Service: Open Heart Surgery;  Laterality: Right;   LEFT HEART CATH AND CORONARY ANGIOGRAPHY N/A 11/02/2019   Procedure: LEFT HEART CATH AND CORONARY ANGIOGRAPHY;  Surgeon: Verlin Lonni BIRCH, MD;  Location: [facility] INVASIVE CV LAB;  Service: Cardiovascular;  Laterality: N/A;   LEFT  HEART CATHETERIZATION WITH CORONARY ANGIOGRAM N/A 11/25/2012   Procedure: LEFT HEART CATHETERIZATION WITH CORONARY ANGIOGRAM;  Surgeon: Ezra GORMAN Shuck, MD;  Location: [facility] CATH LAB;  Service: Cardiovascular;  Laterality: N/A;   PATELLAR TENDON REPAIR  1990's   left   PERCUTANEOUS CORONARY STENT INTERVENTION (PCI-S) N/A 05/14/2012   Procedure: PERCUTANEOUS CORONARY STENT INTERVENTION (PCI-S);  Surgeon: Lonni BIRCH Verlin, MD;  Location: [facility] CATH LAB;  Service: Cardiovascular;  Laterality: N/A;   RADIAL ARTERY HARVEST Left 11/03/2019   Procedure: RADIAL ARTERY HARVEST;  Surgeon: German Bartlett PEDLAR, MD;  Location: [facility] OR;  Service: Open Heart Surgery;  Laterality: Left;   TEE WITHOUT CARDIOVERSION N/A 11/03/2019   Procedure: TRANSESOPHAGEAL ECHOCARDIOGRAM (TEE);  Surgeon: German Bartlett PEDLAR, MD;  Location: [facility] OR;  Service: Open Heart Surgery;  Laterality: N/A;    Allergies: Allergies  Allergen Reactions   Crab [Shellfish Allergy] Swelling   Bee Venom Swelling   Food Allergy Formula Swelling    Crab in stuffing; I eat shrimp and I don't have problems    Outpatient Meds: Current Outpatient Medications  Medication Sig Dispense Refill   aspirin  EC 81 MG tablet Take 81 mg by mouth daily.     ezetimibe  (ZETIA ) 10 MG tablet TAKE 1 TABLET BY MOUTH EVERY DAY 30 tablet 6   hydrochlorothiazide  (HYDRODIURIL ) 25 MG tablet Take 25 mg by mouth daily.     JARDIANCE 25 MG TABS tablet Take 25 mg by  mouth daily.  5   lisinopril  (ZESTRIL ) 40 MG tablet Take 1 tablet (40 mg total) by mouth daily. Please schedule appt for future refills. 3rd & Final attempt 15 tablet 0   metFORMIN (GLUCOPHAGE) 1000 MG tablet Take 1,000 mg by mouth 2 (two) times daily.  5   metoprolol  succinate (TOPROL -XL) 25 MG 24 hr tablet Take 25 mg by mouth daily.     pioglitazone (ACTOS) 45 MG tablet Take 45 mg by mouth daily.     rosuvastatin  (CRESTOR ) 40 MG tablet TAKE 1 TABLET BY MOUTH EVERY DAY 90 tablet 3   Vitamin D, Ergocalciferol,  (DRISDOL) 50000 units CAPS capsule Take 50,000 Units by mouth once a week. Sundays  5   amLODipine  (NORVASC ) 5 MG tablet TAKE 1 TABLET (5 MG TOTAL) BY MOUTH DAILY. (Patient not taking: Reported on 04/21/2024) 30 tablet 11   MOUNJARO 2.5 MG/0.5ML Pen Inject 2.5 mg into the skin once a week.     tiZANidine (ZANAFLEX) 4 MG tablet Take 4 mg by mouth 2 (two) times daily as needed. (Patient not taking: Reported on 04/21/2024)     Current Facility-Administered Medications  Medication Dose Route Frequency Provider Last Rate Last Admin   0.9 %  sodium chloride  infusion  500 mL Intravenous Once Danis, Victory LITTIE MOULD, MD          ___________________________________________________________________ Objective   Exam:  BP (!) 156/98   Pulse 66   Temp (!) 97.3 F (36.3 C) (Temporal)   Ht 5' 7 (1.702 m)   Wt 205 lb (93 kg)   SpO2 98%   BMI 32.11 kg/m   CV: regular , S1/S2 Resp: clear to auscultation bilaterally, normal RR and effort noted GI: soft, no tenderness, with active bowel sounds.   Assessment: Encounter Diagnosis  Name Primary?   Special screening for malignant neoplasms, colon Yes     Plan: Colonoscopy   The benefits and risks of the planned procedure(s) were described in detail with the patient or (when appropriate) their health care proxy.  Risks were outlined as including, but not limited to, bleeding, infection, perforation, adverse medication reaction leading to cardiac or pulmonary decompensation, pancreatitis (if ERCP).  The limitation of incomplete mucosal visualization was also discussed.  No guarantees or warranties were given.  The patient is appropriate for an endoscopic procedure in the ambulatory setting.   - Victory Brand, MD

## 2024-04-22 ENCOUNTER — Telehealth: Payer: Self-pay

## 2024-04-22 NOTE — Telephone Encounter (Signed)
  Follow up Call-     04/21/2024   11:06 AM  Call back number  Post procedure Call Back phone  # [phone]  Permission to leave phone message Yes     Patient questions:  Do you have a fever, pain , or abdominal swelling? No. Pain Score  0 *  Have you tolerated food without any problems? Yes.    Have you been able to return to your normal activities? Yes.    Do you have any questions about your discharge instructions: Diet   No. Medications  No. Follow up visit  No.  Do you have questions or concerns about your Care? No.  Actions: * If pain score is 4 or above: No action needed, pain <4.

## 2024-04-24 LAB — SURGICAL PATHOLOGY

## 2024-04-28 ENCOUNTER — Ambulatory Visit: Payer: Self-pay | Admitting: Gastroenterology

## 2024-06-16 ENCOUNTER — Other Ambulatory Visit: Payer: Self-pay | Admitting: Cardiology

## 2024-06-16 DIAGNOSIS — I10 Essential (primary) hypertension: Secondary | ICD-10-CM

## 2024-07-15 ENCOUNTER — Other Ambulatory Visit: Payer: Self-pay | Admitting: Cardiology

## 2024-07-21 NOTE — Progress Notes (Unsigned)
 HPI: FU coronary artery disease. Patient suffered a myocardial infarction in May of 2011. Ultimately had PCI of RCA. Monitor 10/17 showed sinus to sinus tach. Cath 2/21 showed severe 3 vessel CAD. Echo 2/21 showed normal LV function. Carotid dopplers showed 1-39 bilateral stenosis. Had CABG 2/21 with LIMA to LAD, radial to PDA, free RIMA to OM1 and OM2, SVG to apical portion of LAD and D1. Since I last saw him, the patient denies any dyspnea on exertion, orthopnea, PND, pedal edema, palpitations, syncope or chest pain.   Current Outpatient Medications  Medication Sig Dispense Refill   amLODipine  (NORVASC ) 5 MG tablet TAKE 1 TABLET (5 MG TOTAL) BY MOUTH DAILY. 30 tablet 1   aspirin  EC 81 MG tablet Take 81 mg by mouth daily.     ezetimibe  (ZETIA ) 10 MG tablet TAKE 1 TABLET BY MOUTH EVERY DAY 30 tablet 6   hydrochlorothiazide  (HYDRODIURIL ) 25 MG tablet Take 25 mg by mouth daily.     JARDIANCE 25 MG TABS tablet Take 25 mg by mouth daily.  5   lisinopril  (ZESTRIL ) 40 MG tablet Take 1 tablet (40 mg total) by mouth daily. Please schedule appt for future refills. 3rd & Final attempt 15 tablet 0   metFORMIN (GLUCOPHAGE) 1000 MG tablet Take 1,000 mg by mouth 2 (two) times daily.  5   metoprolol  succinate (TOPROL -XL) 25 MG 24 hr tablet Take 25 mg by mouth daily.     MOUNJARO 2.5 MG/0.5ML Pen Inject 2.5 mg into the skin once a week.     pioglitazone (ACTOS) 45 MG tablet Take 45 mg by mouth daily.     rosuvastatin  (CRESTOR ) 40 MG tablet TAKE 1 TABLET BY MOUTH EVERY DAY 30 tablet 0   Vitamin D, Ergocalciferol, (DRISDOL) 50000 units CAPS capsule Take 50,000 Units by mouth once a week. Sundays  5   tiZANidine (ZANAFLEX) 4 MG tablet Take 4 mg by mouth 2 (two) times daily as needed. (Patient not taking: Reported on 07/24/2024)     No current facility-administered medications for this visit.     Past Medical History:  Diagnosis Date   CAD (coronary artery disease)    a. 01/2010 - MI with unsuccessful  PCI of RCA. b. s/p PCI/DES to mid RCA 05/2012. c. Cath 11/2012 for abnl stress test - diffuse branch and distal vessel CAD for med rx.     Diabetes mellitus (HCC)    a. A1c 6.9 in 11/2012.   Hyperlipidemia    Hypertension    Myocardial infarction (HCC)    OSA on CPAP    Paresthesias    a. bilat upper & lower extremities - intermittently present since 2011.   Sleep apnea     Past Surgical History:  Procedure Laterality Date   CORONARY ANGIOPLASTY WITH STENT PLACEMENT  05/14/2012   1   CORONARY ARTERY BYPASS GRAFT N/A 11/03/2019   Procedure: CORONARY ARTERY BYPASS GRAFTING (CABG) using LIMA to LAD(m); Free RIMA to OM1 and OM2; Left radial artery to PDA; Right Greater Saphenous vein endoscopic harvested: SVG to Diag1; SVG to distal LAD.;  Surgeon: German Bartlett PEDLAR, MD;  Location: [facility] OR;  Service: Open Heart Surgery;  Laterality: N/A;  Left UE (arm) open radial artery harvest; Right LE  (leg) endoscopic harvest.   ENDOVEIN HARVEST OF GREATER SAPHENOUS VEIN Right 11/03/2019   Procedure: Jethro Rubins Of Greater Saphenous Vein of right LE.;  Surgeon: German Bartlett PEDLAR, MD;  Location: [facility] OR;  Service: Open Heart Surgery;  Laterality: Right;   LEFT HEART CATH AND CORONARY ANGIOGRAPHY N/A 11/02/2019   Procedure: LEFT HEART CATH AND CORONARY ANGIOGRAPHY;  Surgeon: Verlin Lonni BIRCH, MD;  Location: [facility] INVASIVE CV LAB;  Service: Cardiovascular;  Laterality: N/A;   LEFT HEART CATHETERIZATION WITH CORONARY ANGIOGRAM N/A 11/25/2012   Procedure: LEFT HEART CATHETERIZATION WITH CORONARY ANGIOGRAM;  Surgeon: Ezra GORMAN Shuck, MD;  Location: [facility] CATH LAB;  Service: Cardiovascular;  Laterality: N/A;   PATELLAR TENDON REPAIR  1990's   left   PERCUTANEOUS CORONARY STENT INTERVENTION (PCI-S) N/A 05/14/2012   Procedure: PERCUTANEOUS CORONARY STENT INTERVENTION (PCI-S);  Surgeon: Lonni BIRCH Verlin, MD;  Location: [facility] CATH LAB;  Service: Cardiovascular;  Laterality: N/A;   RADIAL ARTERY HARVEST Left 11/03/2019    Procedure: RADIAL ARTERY HARVEST;  Surgeon: German Bartlett PEDLAR, MD;  Location: [facility] OR;  Service: Open Heart Surgery;  Laterality: Left;   TEE WITHOUT CARDIOVERSION N/A 11/03/2019   Procedure: TRANSESOPHAGEAL ECHOCARDIOGRAM (TEE);  Surgeon: German Bartlett PEDLAR, MD;  Location: [facility] OR;  Service: Open Heart Surgery;  Laterality: N/A;    Social History   Socioeconomic History   Marital status: Divorced    Spouse name: Not on file   Number of children: 2   Years of education: 12   Highest education level: High school graduate  Occupational History    Employer: UNEMPLOYED  Tobacco Use   Smoking status: Former    Current packs/day: 0.00    Average packs/day: 0.5 packs/day for 18.0 years (9.0 ttl pk-yrs)    Types: Cigarettes    Start date: 11/25/1985    Quit date: 11/26/2003    Years since quitting: 20.6   Smokeless tobacco: Never  Vaping Use   Vaping status: Never Used  Substance and Sexual Activity   Alcohol use: No    Alcohol/week: 0.0 standard drinks of alcohol   Drug use: No   Sexual activity: Yes  Other Topics Concern   Not on file  Social History Narrative   Not on file   Social Drivers of Health   Financial Resource Strain: Not on file  Food Insecurity: Not on file  Transportation Needs: Not on file  Physical Activity: Not on file  Stress: Not on file  Social Connections: Unknown (05/31/2023)   Received from [facility]   Social Network    Social Network: Not on file  Intimate Partner Violence: Unknown (05/31/2023)   Received from [facility]   HITS    Physically Hurt: Not on file    Insult or Talk Down To: Not on file    Threaten Physical Harm: Not on file    Scream or Curse: Not on file    Family History  Problem Relation Age of Onset   Hypertension Mother    Cancer Mother    Diabetes Mother    Stroke Mother    Cancer Father    Colon cancer Neg Hx    Rectal cancer Neg Hx    Stomach cancer Neg Hx    Esophageal cancer Neg Hx    Colon polyps Neg Hx      ROS: no fevers or chills, productive cough, hemoptysis, dysphasia, odynophagia, melena, hematochezia, dysuria, hematuria, rash, seizure activity, orthopnea, PND, pedal edema, claudication. Remaining systems are negative.  Physical Exam: Well-developed well-nourished in no acute distress.  Skin is warm and dry.  HEENT is normal.  Neck is supple.  Chest is clear to auscultation with normal expansion.  Cardiovascular exam is regular rate and rhythm.  Abdominal exam  nontender or distended. No masses palpated. Extremities show no edema. neuro grossly intact  EKG Interpretation Date/Time:  Friday July 24 2024 10:23:04 EST Ventricular Rate:  90 PR Interval:  154 QRS Duration:  90 QT Interval:  380 QTC Calculation: 464 R Axis:   45  Text Interpretation: Normal sinus rhythm Inferolateral MI Confirmed by Pietro Rogue ([id number]) on 07/24/2024 10:24:12 AM    A/P  1 coronary artery disease-patient is not having chest pain.  Continue aspirin  and statin.  2 hyperlipidemia-continue Zetia  and Crestor .  Check lipids, liver and LP(a).  3 hypertension-patient's blood pressure is elevated; however he has not taken his medications.  He states it is typically controlled.  Continue present medications and adjust based on follow-up readings.  Check potassium and renal function.  4 history of palpitations-symptoms are controlled with beta-blockade.  5 obstructive sleep apnea-continue CPAP.  Rogue Pietro, MD

## 2024-07-24 ENCOUNTER — Encounter: Payer: Self-pay | Admitting: Cardiology

## 2024-07-24 ENCOUNTER — Ambulatory Visit: Attending: Cardiology | Admitting: Cardiology

## 2024-07-24 VITALS — BP 160/90 | HR 87 | Resp 18 | Ht 67.0 in | Wt 203.0 lb

## 2024-07-24 DIAGNOSIS — I10 Essential (primary) hypertension: Secondary | ICD-10-CM | POA: Insufficient documentation

## 2024-07-24 DIAGNOSIS — G4733 Obstructive sleep apnea (adult) (pediatric): Secondary | ICD-10-CM | POA: Insufficient documentation

## 2024-07-24 DIAGNOSIS — I25119 Atherosclerotic heart disease of native coronary artery with unspecified angina pectoris: Secondary | ICD-10-CM | POA: Diagnosis present

## 2024-07-24 DIAGNOSIS — E785 Hyperlipidemia, unspecified: Secondary | ICD-10-CM | POA: Insufficient documentation

## 2024-07-24 NOTE — Patient Instructions (Signed)
 Medication Instructions:  Your physician recommends that you continue on your current medications as directed. Please refer to the Current Medication list given to you today.  *If you need a refill on your cardiac medications before your next appointment, please call your pharmacy*  Lab Work: Today- CMET, Lipids, LP(a)  If you have labs (blood work) drawn today and your tests are completely normal, you will receive your results only by: MyChart Message (if you have MyChart) OR A paper copy in the mail If you have any lab test that is abnormal or we need to change your treatment, we will call you to review the results.   Follow-Up: At [facility], you and your health needs are our priority.  As part of our continuing mission to provide you with exceptional heart care, our providers are all part of one team.  This team includes your primary Cardiologist (physician) and Advanced Practice Providers or APPs (Physician Assistants and Nurse Practitioners) who all work together to provide you with the care you need, when you need it.  Your next appointment:   12 month(s)  Provider:   Redell Shallow, MD    We recommend signing up for the patient portal called MyChart.  Sign up information is provided on this After Visit Summary.  MyChart is used to connect with patients for Virtual Visits (Telemedicine).  Patients are able to view lab/test results, encounter notes, upcoming appointments, etc.  Non-urgent messages can be sent to your provider as well.   To learn more about what you can do with MyChart, go to [email or web address].

## 2024-07-28 ENCOUNTER — Ambulatory Visit: Payer: Self-pay | Admitting: Cardiology

## 2024-07-28 LAB — LIPID PANEL
Chol/HDL Ratio: 3 ratio (ref 0.0–5.0)
Cholesterol, Total: 101 mg/dL (ref 100–199)
HDL: 34 mg/dL — ABNORMAL LOW (ref >39)
LDL Chol Calc (NIH): 51 mg/dL (ref 0–99)
Triglycerides: 74 mg/dL (ref 0–149)
VLDL Cholesterol Cal: 16 mg/dL (ref 5–40)

## 2024-07-28 LAB — COMPREHENSIVE METABOLIC PANEL WITH GFR
ALT: 32 IU/L (ref 0–44)
AST: 21 IU/L (ref 0–40)
Albumin: 4.3 g/dL (ref 3.8–4.9)
Alkaline Phosphatase: 76 IU/L (ref 47–123)
BUN/Creatinine Ratio: 14 (ref 9–20)
BUN: 17 mg/dL (ref 6–24)
Bilirubin Total: 0.3 mg/dL (ref 0.0–1.2)
CO2: 26 mmol/L (ref 20–29)
Calcium: 9 mg/dL (ref 8.7–10.2)
Chloride: 104 mmol/L (ref 96–106)
Creatinine, Ser: 1.21 mg/dL (ref 0.76–1.27)
Globulin, Total: 3 g/dL (ref 1.5–4.5)
Glucose: 79 mg/dL (ref 70–99)
Potassium: 4.6 mmol/L (ref 3.5–5.2)
Sodium: 143 mmol/L (ref 134–144)
Total Protein: 7.3 g/dL (ref 6.0–8.5)
eGFR: 70 mL/min/1.73 (ref >59)

## 2024-07-28 LAB — LIPOPROTEIN A (LPA): Lipoprotein (a): 263.4 nmol/L — AB (ref <75.0)

## 2024-08-19 ENCOUNTER — Other Ambulatory Visit: Payer: Self-pay | Admitting: Cardiology

## 2024-08-19 DIAGNOSIS — I10 Essential (primary) hypertension: Secondary | ICD-10-CM
